# Patient Record
Sex: Male | Born: 1950
Health system: Southern US, Community
[De-identification: ages and names within clinical notes are randomized; demographics above are authoritative.]

## PROBLEM LIST (undated history)

## (undated) DIAGNOSIS — M19249 Secondary osteoarthritis, unspecified hand: Secondary | ICD-10-CM

## (undated) DIAGNOSIS — I5022 Chronic systolic (congestive) heart failure: Secondary | ICD-10-CM

## (undated) DIAGNOSIS — I2510 Atherosclerotic heart disease of native coronary artery without angina pectoris: Secondary | ICD-10-CM

## (undated) DIAGNOSIS — I10 Essential (primary) hypertension: Secondary | ICD-10-CM

## (undated) DIAGNOSIS — M7750 Other enthesopathy of unspecified foot: Secondary | ICD-10-CM

## (undated) DIAGNOSIS — M5382 Other specified dorsopathies, cervical region: Secondary | ICD-10-CM

## (undated) DIAGNOSIS — I480 Paroxysmal atrial fibrillation: Secondary | ICD-10-CM

## (undated) DIAGNOSIS — I499 Cardiac arrhythmia, unspecified: Secondary | ICD-10-CM

## (undated) DIAGNOSIS — IMO0002 Reserved for concepts with insufficient information to code with codable children: Secondary | ICD-10-CM

## (undated) HISTORY — DX: Secondary osteoarthritis, unspecified hand: M19.249

## (undated) HISTORY — PX: APPENDECTOMY: SHX54

## (undated) HISTORY — DX: Other enthesopathy of unspecified foot and ankle: M77.50

## (undated) HISTORY — DX: Chronic systolic (congestive) heart failure: I50.22

## (undated) HISTORY — DX: Reserved for concepts with insufficient information to code with codable children: IMO0002

## (undated) HISTORY — DX: Paroxysmal atrial fibrillation: I48.0

## (undated) HISTORY — PX: CHOLECYSTECTOMY: SHX55

## (undated) HISTORY — DX: Other specified dorsopathies, cervical region: M53.82

---
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
## 1998-03-31 ENCOUNTER — Encounter: Admission: RE | Admit: 1998-03-31 | Discharge: 1998-03-31 | Payer: Self-pay | Admitting: Sports Medicine

## 1998-04-21 ENCOUNTER — Encounter: Admission: RE | Admit: 1998-04-21 | Discharge: 1998-04-21 | Payer: Self-pay | Admitting: Family Medicine

## 1998-04-24 ENCOUNTER — Encounter: Admission: RE | Admit: 1998-04-24 | Discharge: 1998-07-23 | Payer: Self-pay | Admitting: Sports Medicine

## 1998-05-04 ENCOUNTER — Encounter: Admission: RE | Admit: 1998-05-04 | Discharge: 1998-05-04 | Payer: Self-pay | Admitting: Family Medicine

## 1998-06-05 ENCOUNTER — Encounter: Admission: RE | Admit: 1998-06-05 | Discharge: 1998-06-05 | Payer: Self-pay | Admitting: Family Medicine

## 1998-07-07 ENCOUNTER — Encounter: Admission: RE | Admit: 1998-07-07 | Discharge: 1998-07-07 | Payer: Self-pay | Admitting: Family Medicine

## 1998-07-14 ENCOUNTER — Ambulatory Visit (HOSPITAL_COMMUNITY): Admission: RE | Admit: 1998-07-14 | Discharge: 1998-07-14 | Payer: Self-pay | Admitting: Sports Medicine

## 1998-07-14 ENCOUNTER — Encounter: Payer: Self-pay | Admitting: Sports Medicine

## 1998-11-11 HISTORY — PX: MULTIPLE TOOTH EXTRACTIONS: SHX2053

## 1999-04-04 ENCOUNTER — Ambulatory Visit (HOSPITAL_COMMUNITY): Admission: RE | Admit: 1999-04-04 | Discharge: 1999-04-04 | Payer: Self-pay | Admitting: *Deleted

## 1999-04-04 ENCOUNTER — Inpatient Hospital Stay (HOSPITAL_COMMUNITY): Admission: EM | Admit: 1999-04-04 | Discharge: 1999-04-06 | Payer: Self-pay | Admitting: Surgery

## 1999-04-04 ENCOUNTER — Encounter: Admission: RE | Admit: 1999-04-04 | Discharge: 1999-04-04 | Payer: Self-pay | Admitting: Family Medicine

## 2000-02-07 ENCOUNTER — Encounter: Admission: RE | Admit: 2000-02-07 | Discharge: 2000-02-07 | Payer: Self-pay | Admitting: Sports Medicine

## 2000-02-15 ENCOUNTER — Encounter: Admission: RE | Admit: 2000-02-15 | Discharge: 2000-02-15 | Payer: Self-pay | Admitting: Family Medicine

## 2000-03-10 ENCOUNTER — Encounter: Admission: RE | Admit: 2000-03-10 | Discharge: 2000-03-10 | Payer: Self-pay | Admitting: Sports Medicine

## 2000-04-18 ENCOUNTER — Ambulatory Visit (HOSPITAL_COMMUNITY): Admission: RE | Admit: 2000-04-18 | Discharge: 2000-04-18 | Payer: Self-pay | Admitting: Sports Medicine

## 2001-09-22 ENCOUNTER — Encounter: Admission: RE | Admit: 2001-09-22 | Discharge: 2001-09-22 | Payer: Self-pay | Admitting: Sports Medicine

## 2001-12-03 ENCOUNTER — Encounter: Admission: RE | Admit: 2001-12-03 | Discharge: 2001-12-03 | Payer: Self-pay | Admitting: Sports Medicine

## 2002-08-02 ENCOUNTER — Encounter: Admission: RE | Admit: 2002-08-02 | Discharge: 2002-08-02 | Payer: Self-pay | Admitting: Family Medicine

## 2002-08-02 ENCOUNTER — Ambulatory Visit (HOSPITAL_COMMUNITY): Admission: RE | Admit: 2002-08-02 | Discharge: 2002-08-02 | Payer: Self-pay | Admitting: Sports Medicine

## 2002-11-10 ENCOUNTER — Encounter: Admission: RE | Admit: 2002-11-10 | Discharge: 2002-11-10 | Payer: Self-pay | Admitting: Sports Medicine

## 2002-11-10 ENCOUNTER — Encounter: Payer: Self-pay | Admitting: Sports Medicine

## 2002-11-15 ENCOUNTER — Encounter: Admission: RE | Admit: 2002-11-15 | Discharge: 2002-11-15 | Payer: Self-pay | Admitting: Sports Medicine

## 2002-11-15 ENCOUNTER — Encounter: Payer: Self-pay | Admitting: Sports Medicine

## 2002-11-19 ENCOUNTER — Encounter: Admission: RE | Admit: 2002-11-19 | Discharge: 2002-11-19 | Payer: Self-pay | Admitting: Sports Medicine

## 2002-11-26 ENCOUNTER — Encounter: Admission: RE | Admit: 2002-11-26 | Discharge: 2002-11-26 | Payer: Self-pay | Admitting: Family Medicine

## 2002-11-26 ENCOUNTER — Ambulatory Visit (HOSPITAL_COMMUNITY): Admission: RE | Admit: 2002-11-26 | Discharge: 2002-11-26 | Payer: Self-pay | Admitting: Sports Medicine

## 2002-12-09 ENCOUNTER — Encounter: Admission: RE | Admit: 2002-12-09 | Discharge: 2002-12-09 | Payer: Self-pay | Admitting: Sports Medicine

## 2002-12-30 ENCOUNTER — Encounter: Admission: RE | Admit: 2002-12-30 | Discharge: 2002-12-30 | Payer: Self-pay | Admitting: Sports Medicine

## 2003-01-27 ENCOUNTER — Encounter: Admission: RE | Admit: 2003-01-27 | Discharge: 2003-01-27 | Payer: Self-pay | Admitting: Sports Medicine

## 2004-01-05 ENCOUNTER — Encounter: Admission: RE | Admit: 2004-01-05 | Discharge: 2004-01-05 | Payer: Self-pay | Admitting: Family Medicine

## 2004-01-19 ENCOUNTER — Encounter: Admission: RE | Admit: 2004-01-19 | Discharge: 2004-01-19 | Payer: Self-pay | Admitting: Sports Medicine

## 2004-02-27 ENCOUNTER — Ambulatory Visit (HOSPITAL_COMMUNITY): Admission: RE | Admit: 2004-02-27 | Discharge: 2004-02-27 | Payer: Self-pay | Admitting: Sports Medicine

## 2004-07-24 ENCOUNTER — Ambulatory Visit: Payer: Self-pay

## 2004-07-31 ENCOUNTER — Ambulatory Visit: Payer: Self-pay | Admitting: Sports Medicine

## 2005-01-25 ENCOUNTER — Ambulatory Visit (HOSPITAL_COMMUNITY): Admission: RE | Admit: 2005-01-25 | Discharge: 2005-01-25 | Payer: Self-pay | Admitting: Gastroenterology

## 2005-03-01 ENCOUNTER — Ambulatory Visit (HOSPITAL_COMMUNITY): Admission: RE | Admit: 2005-03-01 | Discharge: 2005-03-01 | Payer: Self-pay | Admitting: Sports Medicine

## 2005-05-15 ENCOUNTER — Ambulatory Visit: Payer: Self-pay | Admitting: Sports Medicine

## 2005-12-26 ENCOUNTER — Ambulatory Visit: Payer: Self-pay | Admitting: Family Medicine

## 2006-07-24 ENCOUNTER — Ambulatory Visit: Payer: Self-pay | Admitting: Sports Medicine

## 2006-07-24 ENCOUNTER — Ambulatory Visit (HOSPITAL_COMMUNITY): Admission: RE | Admit: 2006-07-24 | Discharge: 2006-07-24 | Payer: Self-pay | Admitting: Sports Medicine

## 2007-01-08 DIAGNOSIS — K5732 Diverticulitis of large intestine without perforation or abscess without bleeding: Secondary | ICD-10-CM

## 2007-01-08 DIAGNOSIS — M5382 Other specified dorsopathies, cervical region: Secondary | ICD-10-CM | POA: Insufficient documentation

## 2007-01-08 DIAGNOSIS — C4490 Unspecified malignant neoplasm of skin, unspecified: Secondary | ICD-10-CM

## 2007-01-08 DIAGNOSIS — IMO0002 Reserved for concepts with insufficient information to code with codable children: Secondary | ICD-10-CM

## 2007-01-08 DIAGNOSIS — G250 Essential tremor: Secondary | ICD-10-CM

## 2007-01-08 DIAGNOSIS — G252 Other specified forms of tremor: Secondary | ICD-10-CM

## 2007-01-08 DIAGNOSIS — I4891 Unspecified atrial fibrillation: Secondary | ICD-10-CM

## 2007-06-30 ENCOUNTER — Ambulatory Visit: Payer: Self-pay | Admitting: Sports Medicine

## 2007-06-30 DIAGNOSIS — L259 Unspecified contact dermatitis, unspecified cause: Secondary | ICD-10-CM

## 2007-06-30 DIAGNOSIS — M19249 Secondary osteoarthritis, unspecified hand: Secondary | ICD-10-CM

## 2007-09-22 ENCOUNTER — Ambulatory Visit: Payer: Self-pay | Admitting: Sports Medicine

## 2007-09-22 DIAGNOSIS — M7750 Other enthesopathy of unspecified foot: Secondary | ICD-10-CM | POA: Insufficient documentation

## 2007-09-22 DIAGNOSIS — B070 Plantar wart: Secondary | ICD-10-CM

## 2007-09-29 ENCOUNTER — Ambulatory Visit: Payer: Self-pay | Admitting: Sports Medicine

## 2007-11-18 ENCOUNTER — Encounter: Payer: Self-pay | Admitting: *Deleted

## 2007-12-10 ENCOUNTER — Telehealth: Payer: Self-pay | Admitting: *Deleted

## 2007-12-10 ENCOUNTER — Encounter: Payer: Self-pay | Admitting: Sports Medicine

## 2007-12-10 ENCOUNTER — Telehealth: Payer: Self-pay | Admitting: Sports Medicine

## 2008-01-12 ENCOUNTER — Ambulatory Visit: Payer: Self-pay | Admitting: Sports Medicine

## 2008-04-08 ENCOUNTER — Encounter (INDEPENDENT_AMBULATORY_CARE_PROVIDER_SITE_OTHER): Payer: Self-pay | Admitting: *Deleted

## 2008-04-08 ENCOUNTER — Ambulatory Visit: Payer: Self-pay | Admitting: Family Medicine

## 2008-04-08 DIAGNOSIS — R030 Elevated blood-pressure reading, without diagnosis of hypertension: Secondary | ICD-10-CM | POA: Insufficient documentation

## 2008-04-19 ENCOUNTER — Telehealth (INDEPENDENT_AMBULATORY_CARE_PROVIDER_SITE_OTHER): Payer: Self-pay | Admitting: *Deleted

## 2008-04-19 ENCOUNTER — Ambulatory Visit: Payer: Self-pay | Admitting: Sports Medicine

## 2008-11-11 DIAGNOSIS — I219 Acute myocardial infarction, unspecified: Secondary | ICD-10-CM

## 2008-11-11 HISTORY — DX: Acute myocardial infarction, unspecified: I21.9

## 2009-08-23 ENCOUNTER — Ambulatory Visit: Payer: Self-pay | Admitting: Sports Medicine

## 2009-08-23 DIAGNOSIS — M19049 Primary osteoarthritis, unspecified hand: Secondary | ICD-10-CM | POA: Insufficient documentation

## 2009-10-11 HISTORY — PX: CORONARY ARTERY BYPASS GRAFT: SHX141

## 2009-10-21 ENCOUNTER — Inpatient Hospital Stay (HOSPITAL_COMMUNITY): Admission: EM | Admit: 2009-10-21 | Discharge: 2009-10-28 | Payer: Self-pay | Admitting: Gastroenterology

## 2009-10-21 ENCOUNTER — Ambulatory Visit: Payer: Self-pay | Admitting: Thoracic Surgery (Cardiothoracic Vascular Surgery)

## 2009-10-26 ENCOUNTER — Encounter: Payer: Self-pay | Admitting: Thoracic Surgery (Cardiothoracic Vascular Surgery)

## 2009-11-02 ENCOUNTER — Inpatient Hospital Stay (HOSPITAL_COMMUNITY): Admission: AD | Admit: 2009-11-02 | Discharge: 2009-11-06 | Payer: Self-pay | Admitting: Interventional Cardiology

## 2009-11-13 ENCOUNTER — Ambulatory Visit: Payer: Self-pay | Admitting: Thoracic Surgery (Cardiothoracic Vascular Surgery)

## 2009-11-13 ENCOUNTER — Encounter
Admission: RE | Admit: 2009-11-13 | Discharge: 2009-11-13 | Payer: Self-pay | Admitting: Thoracic Surgery (Cardiothoracic Vascular Surgery)

## 2009-11-23 ENCOUNTER — Encounter (HOSPITAL_COMMUNITY): Admission: RE | Admit: 2009-11-23 | Discharge: 2010-02-21 | Payer: Self-pay | Admitting: Interventional Cardiology

## 2010-02-22 ENCOUNTER — Encounter (HOSPITAL_COMMUNITY): Admission: RE | Admit: 2010-02-22 | Discharge: 2010-03-12 | Payer: Self-pay | Admitting: Interventional Cardiology

## 2010-02-26 ENCOUNTER — Ambulatory Visit (HOSPITAL_COMMUNITY): Admission: RE | Admit: 2010-02-26 | Discharge: 2010-02-26 | Payer: Self-pay | Admitting: Interventional Cardiology

## 2010-02-26 ENCOUNTER — Ambulatory Visit: Payer: Self-pay | Admitting: Cardiology

## 2010-05-21 ENCOUNTER — Encounter
Admission: RE | Admit: 2010-05-21 | Discharge: 2010-05-21 | Payer: Self-pay | Admitting: Thoracic Surgery (Cardiothoracic Vascular Surgery)

## 2010-05-21 ENCOUNTER — Ambulatory Visit: Payer: Self-pay | Admitting: Thoracic Surgery (Cardiothoracic Vascular Surgery)

## 2010-06-02 IMAGING — CR DG CHEST 2V
2 series · 2 of 2 positions shown · non-contrast
Comparison: None

CLINICAL DATA: Short of breath, history of atrial fibrillation

CHEST - 2 VIEW

[w chest pa]
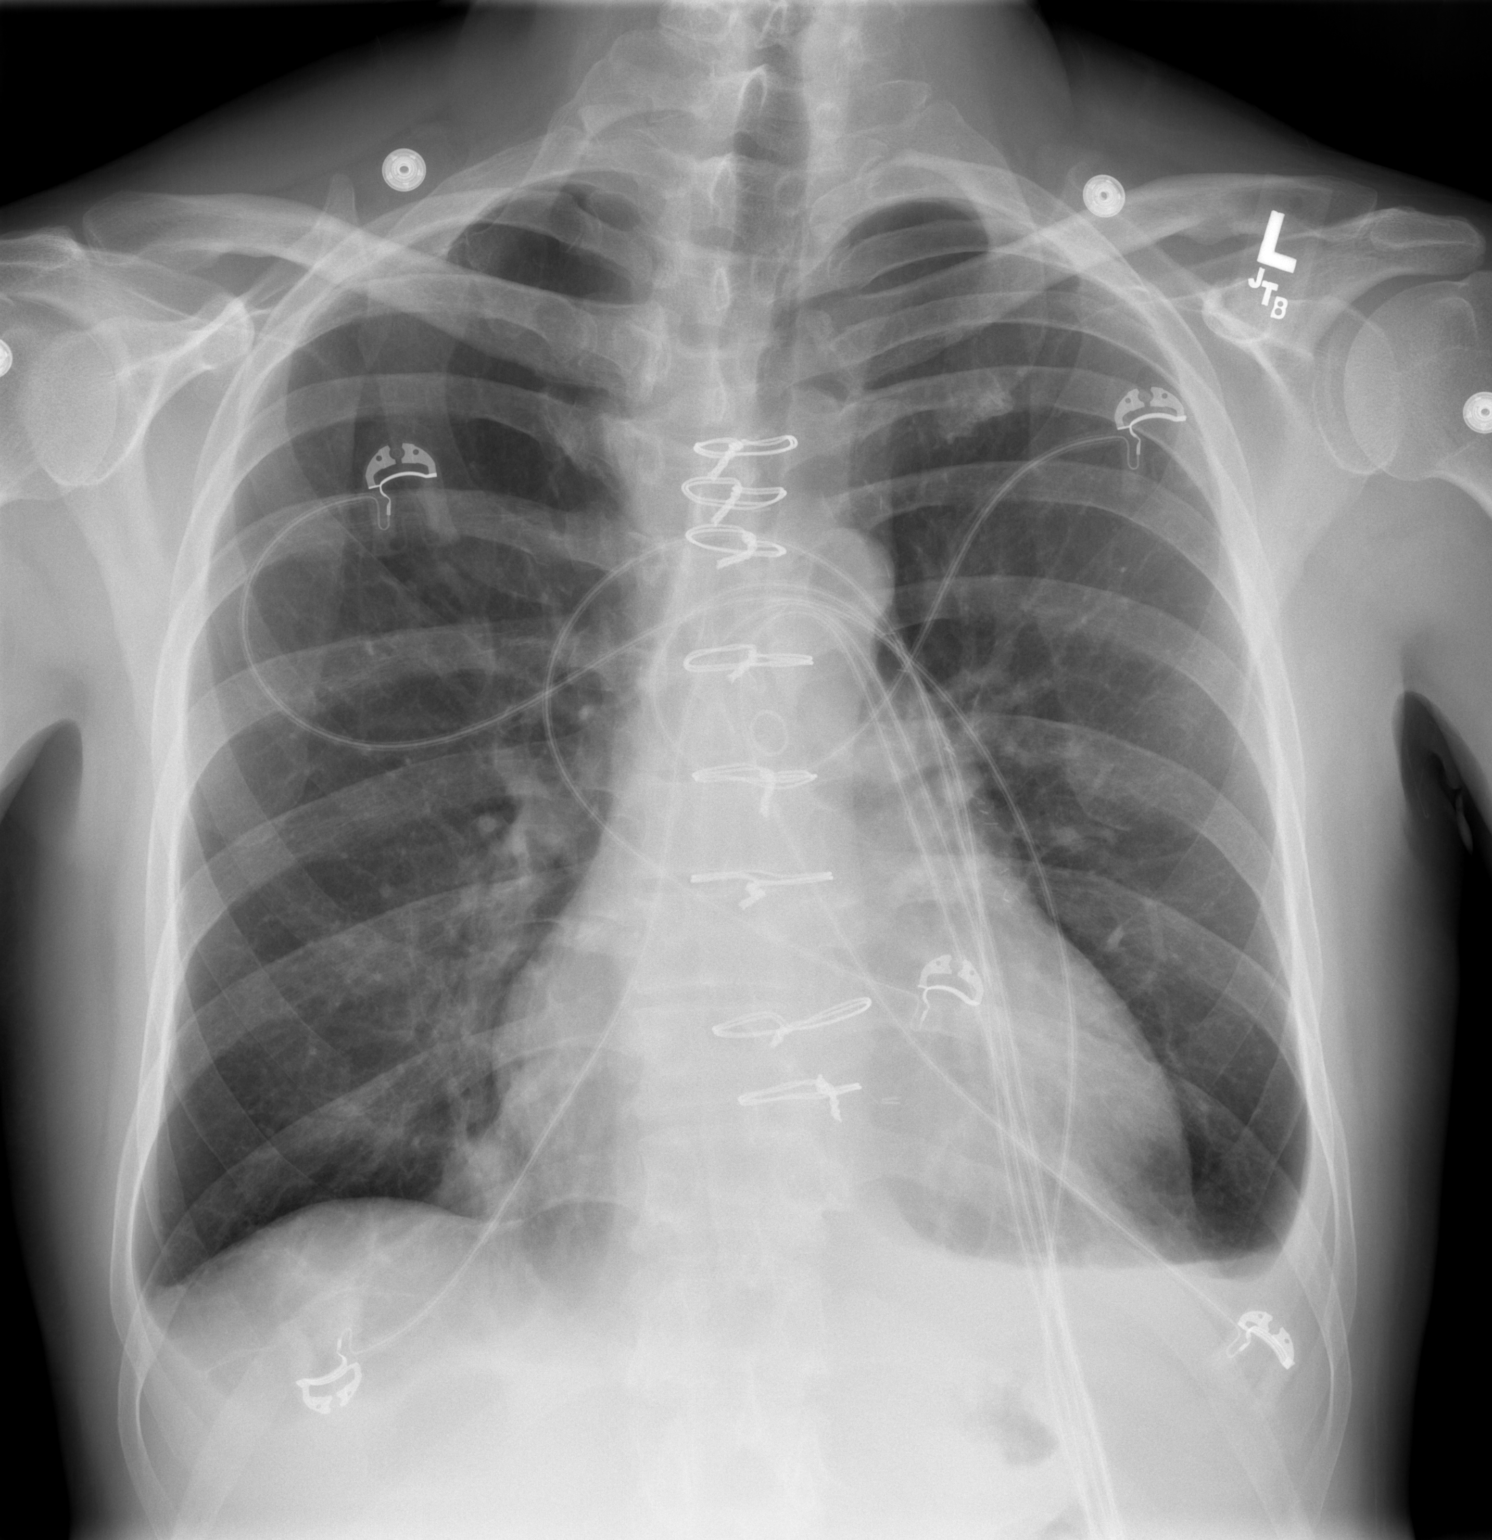

[w chest lat]
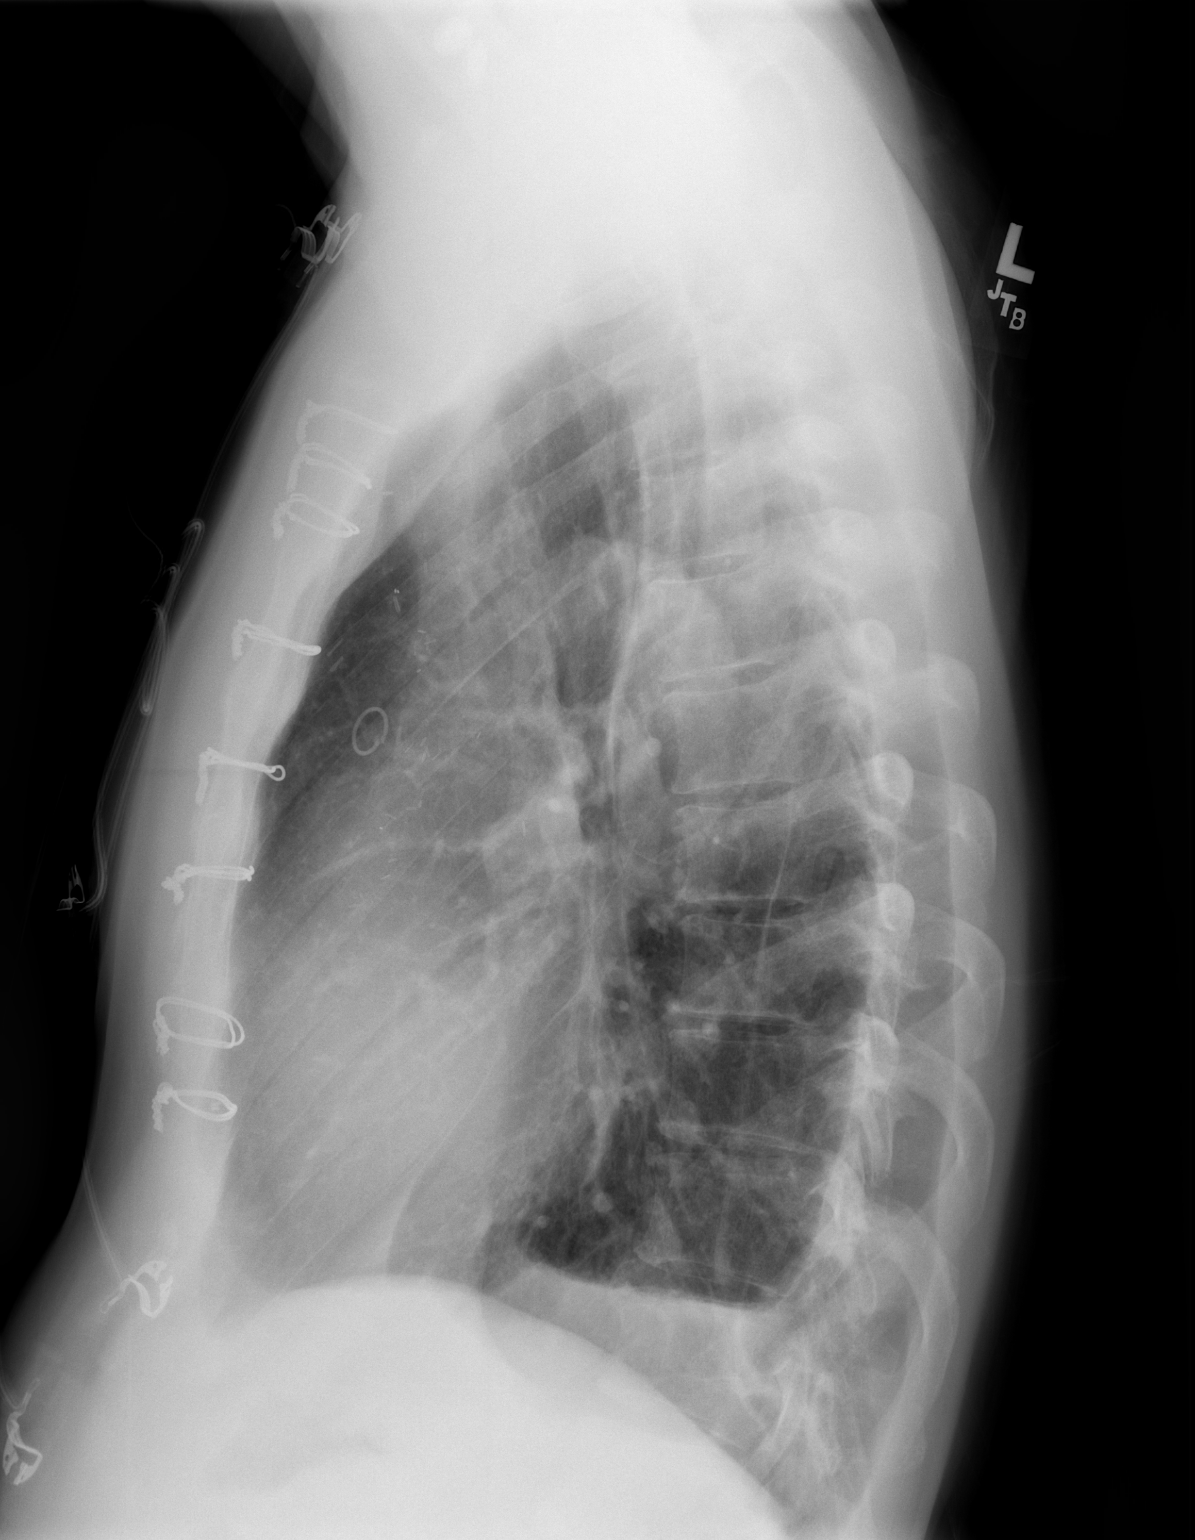

[2 of 2 positions shown; findings below may reference images not displayed]

FINDINGS: There is parenchymal opacity at the right lung base.
This could be due to prior pneumonia with scarring, but active
pneumonia or lung nodules cannot be excluded.  There is also
blunting of the left costophrenic angle which may be due to
effusion and/or pleural thickening.  Comparison with prior or
follow-up chest x-ray is recommended.  There is mild cardiomegaly
present.  Median sternotomy sutures are noted from prior CABG.
IMPRESSION: 1.  Nodular opacities at the right lung base may be due to
pneumonia, scarring from prior infection, or lung nodules and
comparison with prior or follow-up chest x-ray is recommended.
2.  Blunted left costophrenic angle consistent with effusion and
possible pleural thickening.
3.  Cardiomegaly.

## 2010-12-02 ENCOUNTER — Encounter: Payer: Self-pay | Admitting: Thoracic Surgery (Cardiothoracic Vascular Surgery)

## 2011-02-11 LAB — BASIC METABOLIC PANEL
BUN: 13 mg/dL (ref 6–23)
BUN: 12 mg/dL (ref 6–23)
BUN: 13 mg/dL (ref 6–23)
BUN: 14 mg/dL (ref 6–23)
BUN: 15 mg/dL (ref 6–23)
CO2: 25 mEq/L (ref 19–32)
CO2: 26 mEq/L (ref 19–32)
CO2: 27 mEq/L (ref 19–32)
Calcium: 7.8 mg/dL — ABNORMAL LOW (ref 8.4–10.5)
Calcium: 8.5 mg/dL (ref 8.4–10.5)
Calcium: 8.7 mg/dL (ref 8.4–10.5)
Chloride: 102 mEq/L (ref 96–112)
Chloride: 99 mEq/L (ref 96–112)
Chloride: 103 mEq/L (ref 96–112)
Chloride: 103 mEq/L (ref 96–112)
Chloride: 104 mEq/L (ref 96–112)
Chloride: 106 mEq/L (ref 96–112)
Creatinine, Ser: 1.08 mg/dL (ref 0.4–1.5)
Creatinine, Ser: 1.1 mg/dL (ref 0.4–1.5)
Creatinine, Ser: 1.17 mg/dL (ref 0.4–1.5)
Creatinine, Ser: 1.19 mg/dL (ref 0.4–1.5)
GFR calc Af Amer: >60 mL/min (ref >60)
GFR calc Af Amer: >60 mL/min (ref >60)
GFR calc Af Amer: >60 mL/min (ref >60)
GFR calc Af Amer: >60 mL/min (ref >60)
GFR calc non Af Amer: >60 mL/min (ref >60)
Glucose, Bld: 108 mg/dL — ABNORMAL HIGH (ref 70–99)
Glucose, Bld: 101 mg/dL — ABNORMAL HIGH (ref 70–99)
Glucose, Bld: 105 mg/dL — ABNORMAL HIGH (ref 70–99)
Glucose, Bld: 99 mg/dL (ref 70–99)
Potassium: 3.7 mEq/L (ref 3.5–5.1)
Potassium: 3.5 mEq/L (ref 3.5–5.1)
Potassium: 3.9 mEq/L (ref 3.5–5.1)
Sodium: 135 mEq/L (ref 135–145)

## 2011-02-11 LAB — CBC
MCHC: 34.3 g/dL (ref 30.0–36.0)
MCHC: 34.3 g/dL (ref 30.0–36.0)
MCV: 92.2 fL (ref 78.0–100.0)
MCV: 92.6 fL (ref 78.0–100.0)
Platelets: 408 10*3/uL — ABNORMAL HIGH (ref 150–400)
Platelets: 450 10*3/uL — ABNORMAL HIGH (ref 150–400)
RBC: 3.35 MIL/uL — ABNORMAL LOW (ref 4.22–5.81)
RDW: 13.1 % (ref 11.5–15.5)
RDW: 13.2 % (ref 11.5–15.5)

## 2011-02-11 LAB — PROTIME-INR
INR: 1.37 (ref 0.00–1.49)
INR: 1.74 — ABNORMAL HIGH (ref 0.00–1.49)
INR: 2.1 — ABNORMAL HIGH (ref 0.00–1.49)
INR: 2.31 — ABNORMAL HIGH (ref 0.00–1.49)
INR: 2.49 — ABNORMAL HIGH (ref 0.00–1.49)
INR: 2.89 — ABNORMAL HIGH (ref 0.00–1.49)
Prothrombin Time: 16.8 seconds — ABNORMAL HIGH (ref 11.6–15.2)
Prothrombin Time: 19 seconds — ABNORMAL HIGH (ref 11.6–15.2)
Prothrombin Time: 25.2 seconds — ABNORMAL HIGH (ref 11.6–15.2)
Prothrombin Time: 30 seconds — ABNORMAL HIGH (ref 11.6–15.2)

## 2011-02-11 LAB — BRAIN NATRIURETIC PEPTIDE
Pro B Natriuretic peptide (BNP): 941 pg/mL — ABNORMAL HIGH (ref 0.0–100.0)
Pro B Natriuretic peptide (BNP): 973 pg/mL — ABNORMAL HIGH (ref 0.0–100.0)

## 2011-02-11 LAB — GLUCOSE, CAPILLARY
Glucose-Capillary: 102 mg/dL — ABNORMAL HIGH (ref 70–99)
Glucose-Capillary: 135 mg/dL — ABNORMAL HIGH (ref 70–99)

## 2011-02-11 LAB — APTT: aPTT: 52 seconds — ABNORMAL HIGH (ref 24–37)

## 2011-02-12 LAB — CBC
HCT: 32.9 % — ABNORMAL LOW (ref 39.0–52.0)
HCT: 37.9 % — ABNORMAL LOW (ref 39.0–52.0)
Hemoglobin: 11.9 g/dL — ABNORMAL LOW (ref 13.0–17.0)
Hemoglobin: 13.4 g/dL (ref 13.0–17.0)
Hemoglobin: 14.2 g/dL (ref 13.0–17.0)
MCHC: 34.5 g/dL (ref 30.0–36.0)
MCHC: 34.7 g/dL (ref 30.0–36.0)
MCHC: 35 g/dL (ref 30.0–36.0)
MCHC: 35.2 g/dL (ref 30.0–36.0)
MCHC: 35.4 g/dL (ref 30.0–36.0)
MCV: 91.1 fL (ref 78.0–100.0)
MCV: 91.7 fL (ref 78.0–100.0)
MCV: 92.6 fL (ref 78.0–100.0)
MCV: 92.7 fL (ref 78.0–100.0)
MCV: 92.7 fL (ref 78.0–100.0)
Platelets: 140 10*3/uL — ABNORMAL LOW (ref 150–400)
Platelets: 176 10*3/uL (ref 150–400)
Platelets: 193 10*3/uL (ref 150–400)
Platelets: 234 10*3/uL (ref 150–400)
RBC: 3.55 MIL/uL — ABNORMAL LOW (ref 4.22–5.81)
RBC: 3.71 MIL/uL — ABNORMAL LOW (ref 4.22–5.81)
RBC: 4.16 MIL/uL — ABNORMAL LOW (ref 4.22–5.81)
RBC: 4.51 MIL/uL (ref 4.22–5.81)
RDW: 12.4 % (ref 11.5–15.5)
RDW: 12.7 % (ref 11.5–15.5)
RDW: 12.8 % (ref 11.5–15.5)
WBC: 11 10*3/uL — ABNORMAL HIGH (ref 4.0–10.5)
WBC: 17.2 10*3/uL — ABNORMAL HIGH (ref 4.0–10.5)
WBC: 9.6 10*3/uL (ref 4.0–10.5)

## 2011-02-12 LAB — CK TOTAL AND CKMB (NOT AT ARMC): Total CK: 3042 U/L — ABNORMAL HIGH (ref 7–232)

## 2011-02-12 LAB — POCT I-STAT 3, ART BLOOD GAS (G3+)
Acid-base deficit: 1 mmol/L (ref 0.0–2.0)
Acid-base deficit: 1 mmol/L (ref 0.0–2.0)
Acid-base deficit: 5 mmol/L — ABNORMAL HIGH (ref 0.0–2.0)
Acid-base deficit: 7 mmol/L — ABNORMAL HIGH (ref 0.0–2.0)
Acid-base deficit: 8 mmol/L — ABNORMAL HIGH (ref 0.0–2.0)
Acid-base deficit: 9 mmol/L — ABNORMAL HIGH (ref 0.0–2.0)
Bicarbonate: 19.8 mEq/L — ABNORMAL LOW (ref 20.0–24.0)
Bicarbonate: 20.5 mEq/L (ref 20.0–24.0)
Bicarbonate: 26.5 mEq/L — ABNORMAL HIGH (ref 20.0–24.0)
O2 Saturation: 65 %
O2 Saturation: 90 %
O2 Saturation: 90 %
Patient temperature: 35.8
TCO2: 21 mmol/L (ref 0–100)
TCO2: 22 mmol/L (ref 0–100)
pCO2 arterial: 32.9 mmHg — ABNORMAL LOW (ref 35.0–45.0)
pCO2 arterial: 53.1 mmHg — ABNORMAL HIGH (ref 35.0–45.0)
pCO2 arterial: 55.4 mmHg — ABNORMAL HIGH (ref 35.0–45.0)
pCO2 arterial: 69.4 mmHg (ref 35.0–45.0)
pH, Arterial: 7.132 — CL (ref 7.350–7.450)
pH, Arterial: 7.199 — CL (ref 7.350–7.450)
pO2, Arterial: 33 mmHg — CL (ref 80.0–100.0)
pO2, Arterial: 54 mmHg — ABNORMAL LOW (ref 80.0–100.0)
pO2, Arterial: 54 mmHg — ABNORMAL LOW (ref 80.0–100.0)
pO2, Arterial: 77 mmHg — ABNORMAL LOW (ref 80.0–100.0)

## 2011-02-12 LAB — GLUCOSE, CAPILLARY
Glucose-Capillary: 108 mg/dL — ABNORMAL HIGH (ref 70–99)
Glucose-Capillary: 108 mg/dL — ABNORMAL HIGH (ref 70–99)
Glucose-Capillary: 131 mg/dL — ABNORMAL HIGH (ref 70–99)
Glucose-Capillary: 153 mg/dL — ABNORMAL HIGH (ref 70–99)

## 2011-02-12 LAB — PROTIME-INR
INR: 1.08 (ref 0.00–1.49)
INR: 1.27 (ref 0.00–1.49)
INR: 1.34 (ref 0.00–1.49)
INR: 1.7 — ABNORMAL HIGH (ref 0.00–1.49)
Prothrombin Time: 13.9 seconds (ref 11.6–15.2)
Prothrombin Time: 16.5 seconds — ABNORMAL HIGH (ref 11.6–15.2)
Prothrombin Time: 19.8 seconds — ABNORMAL HIGH (ref 11.6–15.2)

## 2011-02-12 LAB — POCT I-STAT, CHEM 8
BUN: 15 mg/dL (ref 6–23)
Calcium, Ion: 1.03 mmol/L — ABNORMAL LOW (ref 1.12–1.32)
Calcium, Ion: 1.33 mmol/L — ABNORMAL HIGH (ref 1.12–1.32)
Chloride: 108 mEq/L (ref 96–112)
Chloride: 110 mEq/L (ref 96–112)
Creatinine, Ser: 1.3 mg/dL (ref 0.4–1.5)
Glucose, Bld: 150 mg/dL — ABNORMAL HIGH (ref 70–99)
HCT: 42 % (ref 39.0–52.0)
Hemoglobin: 14.3 g/dL (ref 13.0–17.0)
Sodium: 142 mEq/L (ref 135–145)
TCO2: 20 mmol/L (ref 0–100)
TCO2: 22 mmol/L (ref 0–100)

## 2011-02-12 LAB — BASIC METABOLIC PANEL
BUN: 16 mg/dL (ref 6–23)
BUN: 26 mg/dL — ABNORMAL HIGH (ref 6–23)
BUN: 27 mg/dL — ABNORMAL HIGH (ref 6–23)
CO2: 22 mEq/L (ref 19–32)
CO2: 25 mEq/L (ref 19–32)
CO2: 26 mEq/L (ref 19–32)
CO2: 27 mEq/L (ref 19–32)
Calcium: 8.2 mg/dL — ABNORMAL LOW (ref 8.4–10.5)
Calcium: 8.5 mg/dL (ref 8.4–10.5)
Calcium: 8.7 mg/dL (ref 8.4–10.5)
Calcium: 9.2 mg/dL (ref 8.4–10.5)
Chloride: 103 mEq/L (ref 96–112)
Chloride: 105 mEq/L (ref 96–112)
Chloride: 108 mEq/L (ref 96–112)
Chloride: 98 mEq/L (ref 96–112)
Creatinine, Ser: 1.08 mg/dL (ref 0.4–1.5)
Creatinine, Ser: 1.15 mg/dL (ref 0.4–1.5)
Creatinine, Ser: 1.19 mg/dL (ref 0.4–1.5)
Creatinine, Ser: 1.34 mg/dL (ref 0.4–1.5)
GFR calc Af Amer: >60 mL/min (ref >60)
GFR calc Af Amer: >60 mL/min (ref >60)
GFR calc Af Amer: >60 mL/min (ref >60)
GFR calc non Af Amer: >60 mL/min (ref >60)
GFR calc non Af Amer: >60 mL/min (ref >60)
Glucose, Bld: 119 mg/dL — ABNORMAL HIGH (ref 70–99)
Glucose, Bld: 152 mg/dL — ABNORMAL HIGH (ref 70–99)
Glucose, Bld: 157 mg/dL — ABNORMAL HIGH (ref 70–99)
Potassium: 3.9 mEq/L (ref 3.5–5.1)
Potassium: 3.9 mEq/L (ref 3.5–5.1)
Potassium: 4.2 mEq/L (ref 3.5–5.1)
Sodium: 135 mEq/L (ref 135–145)
Sodium: 135 mEq/L (ref 135–145)

## 2011-02-12 LAB — POCT I-STAT 4, (NA,K, GLUC, HGB,HCT)
Glucose, Bld: 123 mg/dL — ABNORMAL HIGH (ref 70–99)
Glucose, Bld: 134 mg/dL — ABNORMAL HIGH (ref 70–99)
Glucose, Bld: 150 mg/dL — ABNORMAL HIGH (ref 70–99)
Glucose, Bld: 152 mg/dL — ABNORMAL HIGH (ref 70–99)
HCT: 41 % (ref 39.0–52.0)
HCT: 47 % (ref 39.0–52.0)
HCT: 48 % (ref 39.0–52.0)
HCT: 49 % (ref 39.0–52.0)
Hemoglobin: 13.9 g/dL (ref 13.0–17.0)
Hemoglobin: 14.3 g/dL (ref 13.0–17.0)
Hemoglobin: 16 g/dL (ref 13.0–17.0)
Hemoglobin: 16.3 g/dL (ref 13.0–17.0)
Hemoglobin: 16.7 g/dL (ref 13.0–17.0)
Potassium: 3.7 mEq/L (ref 3.5–5.1)
Potassium: 3.8 mEq/L (ref 3.5–5.1)
Potassium: 3.9 mEq/L (ref 3.5–5.1)
Potassium: 4 mEq/L (ref 3.5–5.1)
Sodium: 142 mEq/L (ref 135–145)
Sodium: 142 mEq/L (ref 135–145)
Sodium: 142 mEq/L (ref 135–145)

## 2011-02-12 LAB — CARDIAC PANEL(CRET KIN+CKTOT+MB+TROPI)
CK, MB: 118.5 ng/mL — ABNORMAL HIGH (ref 0.3–4.0)
Relative Index: 9.2 — ABNORMAL HIGH (ref 0.0–2.5)
Troponin I: 61.71 ng/mL (ref 0.00–0.06)

## 2011-02-12 LAB — CREATININE, SERUM: GFR calc Af Amer: >60 mL/min (ref >60)

## 2011-02-12 LAB — DIFFERENTIAL
Basophils Relative: 1 % (ref 0–1)
Eosinophils Absolute: 0.8 10*3/uL — ABNORMAL HIGH (ref 0.0–0.7)
Lymphs Abs: 3.5 10*3/uL (ref 0.7–4.0)
Neutro Abs: 4.4 10*3/uL (ref 1.7–7.7)
Neutrophils Relative %: 46 % (ref 43–77)

## 2011-02-12 LAB — MRSA PCR SCREENING: MRSA by PCR: NEGATIVE

## 2011-02-12 LAB — CROSSMATCH: Antibody Screen: NEGATIVE

## 2011-02-12 LAB — APTT
aPTT: 28 seconds (ref 24–37)
aPTT: 40 seconds — ABNORMAL HIGH (ref 24–37)

## 2011-02-12 LAB — MAGNESIUM: Magnesium: 2 mg/dL (ref 1.5–2.5)

## 2011-02-12 LAB — HEMOGLOBIN A1C
Hgb A1c MFr Bld: 5.4 % (ref 4.6–6.1)
Mean Plasma Glucose: 108 mg/dL

## 2011-02-12 LAB — LIPID PANEL
Cholesterol: 97 mg/dL (ref 0–200)
HDL: 34 mg/dL — ABNORMAL LOW (ref >39)

## 2011-03-26 NOTE — Assessment & Plan Note (Signed)
OFFICE VISIT   Isaac [name], Isaac [name]  DOB:  04-Jun-1951                                        May 21, 2010  CHART #:  [id number]   HISTORY OF PRESENT ILLNESS:  The patient returns for follow up status  post emergency median sternotomy for off-pump coronary artery bypass  grafting x2 on October 21, 2009.  He was last seen here in the office  on November 13, 2009.  Since then, he has done extremely well physically.  He returns to the office today due to concerns regarding a small bump he  noticed in the middle of his median sternotomy scar.  He has not had any  fevers or chills, and he has not had any pain at all.  However, he  noticed this pump and he is curious as to whether or not it was anything  to be concerned about.  He otherwise feels quite well and is getting  along without any problems.  The remainder of his review of systems is  unrevealing and the remainder of his past medical history is unchanged.   PHYSICAL EXAMINATION:  Notable for a well-appearing thin white male with  blood pressure 95/62, pulse 60 and regular, oxygen saturation 96% on  room air.  Examination of the chest reveals well-healed median  sternotomy scar.  The sternum is stable on palpation.  The pump that the  patient is concerned about represents an easily palpable end of one of  the sternal wires noted inferiorly.  Because the patient is so thin, the  end of the sternal wire is easily palpable in the subcutaneous tissues.  There is no surrounding erythema.  The skin is completely intact over  the top.  There is no tenderness in this area.  The sternum is  completely stable.  Auscultation of the chest reveals clear breath  sounds.  The remainder of his physical exam is noncontributory.   IMPRESSION:  The patient is doing very well.  The bump he is concerned  about represents the end of one of the sternal wires that is easily  appreciated in the subcutaneous tissues because of the fact  that he is  so thin.  This is not causing any trouble at this time.   PLAN:  I have reassured the patient that this is nothing to be concerned  about.  If he develops any irritation, surrounding erythema, or drainage  from this area, we can always consider  removing the wire if necessary.  At this point, this seems to be  unlikely.  The patient will call and return as needed.   Salvatore Decent. Cornelius Moras, M.D.  Electronically Signed   CHO/MEDQ  D:  05/21/2010  T:  05/22/2010  Job:  [id number]   cc:   Lyn Records, M.D.  Colleen Can. Deborah Chalk, M.D.  Sibyl Parr. Darrick Penna, M.D.

## 2011-03-26 NOTE — Assessment & Plan Note (Signed)
OFFICE VISIT   Isaac, [name]  DOB:  1951-10-20                                        November 13, 2009  CHART #:  [id number]   The patient returns to the office today for routine followup status post  emergency median sternotomy for off-pump coronary artery bypass grafting  x2 on October 21, 2009 for severe single-vessel coronary artery disease  status post acute ST-segment elevation myocardial infarction complicated  by cardiogenic shock and acute pulmonary edema with coronary anatomy  unfavorable for definitive percutaneous therapy.  The patient's  postoperative recovery has been remarkably uneventful.  Following  hospital discharge, he was readmitted for atrial fibrillation.  He was  already been treated with amiodarone and Coumadin, and his amiodarone  dosing was increased.  He was discharged home again and has since then  continued to improve.  He continues to be followed closely by Dr. Katrinka Blazing  who is monitoring his Coumadin therapy as well as other medications.  The patient reports that he is doing well.  He states that he still  feels as though he has some occasional brief episodes of atrial  fibrillation perhaps once or twice daily.  These usually are transient,  short lived, and they are not associated with dizziness or shortness of  breath.  He simply can tell when he feels as though his heart rhythm is  out of sync.  He otherwise feels quite well and has not had problems  with shortness of breath.  He has had minimal soreness in his chest and  he is not taking any pain relievers.  His appetite is marginal, but he  feels as though he is probably getting enough to eat.  He is sleeping  well at night.  He has no other complaints.  The remainder of his review  of systems is unremarkable.   PHYSICAL EXAMINATION:  Notable for a well-appearing male with blood  pressure 106/72, pulse 76, and oxygen saturation 98% on room air.  Examination of the chest  reveals a median sternotomy incision that is  healing very nicely.  The sternum is stable on palpation.  Breath sounds  are clear to auscultation and symmetrical bilaterally.  No wheezes or  rhonchi are noted.  Cardiovascular exam demonstrates regular rate and  rhythm.  No murmurs, rubs, or gallops are appreciated.  The abdomen is  soft and nontender.  The extremities are warm and well perfused.  The  small incision from endoscopic vein harvest just below the right knee is  healing nicely.  There is a small dry eschar on this incision.  The  patient is concerned about the possibility of a retained suture in his  right neck at the location of a previous central line.  This was  explored here at the office today, but no suture material of the foreign  body is identified.  The site is clean.  No other abnormalities are  noted.   DIAGNOSTIC TESTS:  Chest x-ray performed today at the [facility] is reviewed.  This demonstrates clear lung fields bilaterally.  There are no pleural effusions and trivial atelectasis.  All the sternal  wires appear intact.  No other abnormalities are noted.   IMPRESSION:  Excellent progress following recent emergency coronary  artery bypass grafting x2.  The patient has had intermittent episodes of  paroxysmal atrial fibrillation, for which he is on amiodarone and  Coumadin.  He has a history of atrial fibrillation that predates his  heart attack and surgery as well.  Overall, he seems to be doing fine.   PLAN:  I have encouraged the patient to continue to gradually increase  his physical activity as tolerated with his only limitation at this  point remaining that he refrain from heavy lifting or strenuous use of  his arms or shoulders for least another 2 months.  I think, he can  resume driving an automobile and go ahead and get started the cardiac  rehab program, presuming that Dr. Katrinka Blazing agrees.  All of his questions  have been addressed.  We  have not made any changes in his current  medications.  We will plan to see the patient back for further followup  in 6 months to see how his LV function has improved.   Salvatore Decent. Cornelius Moras, M.D.  Electronically Signed   CHO/MEDQ  D:  11/13/2009  T:  11/14/2009  Job:  [id number]   cc:   Lyn Records, M.D.  Colleen Can. Deborah Chalk, M.D.  Sibyl Parr. Darrick Penna, M.D.

## 2011-03-29 NOTE — Op Note (Signed)
NAMEKOHEN, REITHER               ACCOUNT NO.:  [id number]   MEDICAL RECORD NO.:  [id number]          PATIENT TYPE:  AMB   LOCATION:  ENDO                         FACILITY:  [facility]   PHYSICIAN:  Bernette Redbird, M.D.   DATE OF BIRTH:  02/20/1951   DATE OF PROCEDURE:  01/25/2005  DATE OF DISCHARGE:                                 OPERATIVE REPORT   PROCEDURE:  Colonoscopy.   INDICATIONS:  A 60 year old with occasional small volume hematochezia in  need of colon cancer screening.   FINDINGS:  Normal exam to the terminal ileum except minimal diverticulosis.   PROCEDURE:  The nature, purpose, and risks of the procedure had been  discussed with the patient who provided written consent. Sedation was  fentanyl 70 mcg and Versed 7 mg IV without arrhythmias or desaturation.  Digital exam of the prostate was unremarkable. The Olympus adjustable  tension pediatric video colonoscope was advanced to the terminal ileum  without difficulty. The TI had a normal appearance and pullback was then  performed.   There was mild scattered diverticulosis, nothing impressive, and apparently  limited to the left side of colon. No polyps, cancer, colitis, or vascular  malformations were observed and retroflexion in the rectum and reinspection  of the rectum were unremarkable. No biopsies were obtained. The patient  tolerated the procedure well and there no apparent complications.   IMPRESSION:  1.  Rectal bleeding, without obvious source seen on current exam. It is      presumably of hemorrhoidal origin, although the patient did not have      overtly pathologic hemorrhoids seen.  2.  Minimal diverticulosis.   PLAN:  Consider flexible sigmoidoscopy in five years for continued  screening.      RB/MEDQ  D:  01/25/2005  T:  01/25/2005  Job:  [id number]   cc:   Sibyl Parr. Darrick Penna, M.D.  Fax: [phone]

## 2011-03-29 NOTE — Letter (Signed)
November 14, 2009   [employer]  [address] [address]  [place], [id number]   Re:  GERRAD, WELKER                 DOB:  10-Aug-1951   Regarding e-ticket #[id number].   To Whom It May Concern:   Please be advised that Mr. Mccrystal was hospitalized under my care in  December 2010 for an acute myocardial infarction (heart attack) that  required emergency open heart surgery.  As a result, he was unable to  travel on the [employer] referenced above.  It is my  opinion that the full price of his ticket should be refunded.  Please do  not hesitate to contact me if you have any questions.    Sincerely,   Salvatore Decent. Cornelius Moras, M.D.  Electronically Signed   CHO/MEDQ  D:  11/14/2009  T:  11/15/2009  Job:  [id number]   cc:   Myrlene Broker

## 2012-01-06 ENCOUNTER — Ambulatory Visit (INDEPENDENT_AMBULATORY_CARE_PROVIDER_SITE_OTHER): Payer: 59 | Admitting: Internal Medicine

## 2012-01-06 ENCOUNTER — Encounter: Payer: Self-pay | Admitting: Internal Medicine

## 2012-01-06 ENCOUNTER — Encounter: Payer: Self-pay | Admitting: *Deleted

## 2012-01-06 DIAGNOSIS — IMO0002 Reserved for concepts with insufficient information to code with codable children: Secondary | ICD-10-CM

## 2012-01-06 DIAGNOSIS — Z951 Presence of aortocoronary bypass graft: Secondary | ICD-10-CM

## 2012-01-06 DIAGNOSIS — Z7189 Other specified counseling: Secondary | ICD-10-CM

## 2012-01-06 DIAGNOSIS — I2510 Atherosclerotic heart disease of native coronary artery without angina pectoris: Secondary | ICD-10-CM

## 2012-01-06 DIAGNOSIS — I219 Acute myocardial infarction, unspecified: Secondary | ICD-10-CM

## 2012-01-06 DIAGNOSIS — I25709 Atherosclerosis of coronary artery bypass graft(s), unspecified, with unspecified angina pectoris: Secondary | ICD-10-CM | POA: Insufficient documentation

## 2012-01-06 NOTE — Progress Notes (Signed)
Patient ID: Isaac [name], male   DOB: September 22, 1951, 61 y.o.   MRN: [id number]  INFECTIOUS DISEASE PROGRESS NOTE    Patient Active Problem List  Diagnoses  . PLANTAR WART  . SKIN CANCER  . TREMOR, ESSENTIAL/FAMILIAL  . ATRIAL FIBRILLATION  . DIVERTICULITIS OF COLON, NOS  . ECZEMA  . OSTEOARTHROSIS, LOCAL, SCND, HAND  . DEGENERATIVE JOINT DISEASE, HANDS  . 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 CERVICAL SPINE DISORDER, NOS  . METATARSALGIA  . ELEVATED BP READING WITHOUT DX HYPERTENSION  . CAD (coronary artery disease)  . MI (myocardial infarction)  . S/P CABG (coronary artery bypass graft)  . Advice or immunization for travel    Outpatient Encounter Prescriptions as of 01/06/2012  Medication Sig Dispense Refill  . aspirin 81 MG tablet Take 81 mg by mouth daily with breakfast.      . diclofenac sodium (VOLTAREN) 1 % GEL 1 gram four times per day.       . escitalopram (LEXAPRO) 10 MG tablet Take 10 mg by mouth daily with supper.      . metoprolol (TOPROL-XL) 50 MG 24 hr tablet Take 50 mg by mouth daily. As directed.       . metoprolol tartrate (LOPRESSOR) 25 MG tablet Take 12.5 mg by mouth 2 (two) times daily with a meal.      . olmesartan (BENICAR) 20 MG tablet Take 10 mg by mouth daily with breakfast.      . pantoprazole (PROTONIX) 40 MG tablet Take 40 mg by mouth daily before breakfast.      . rosuvastatin (CRESTOR) 10 MG tablet Take 10 mg by mouth daily with supper.      . tadalafil (CIALIS) 5 MG tablet Take 5 mg by mouth every other day.      . Tamsulosin HCl (FLOMAX) 0.4 MG CAPS Take 0.4 mg by mouth daily after supper.      . warfarin (COUMADIN) 2.5 MG tablet Take 2.5 mg by mouth. Take 1 by mouth on Wednesday and 1 1/2 the other days of the week.           Subjective: Isaac [name] and his wife Darreld Mclean will be traveling to [place] this coming August. I will fly into [place] then onto [place] and [place] before returning home. She will be on a total of 18 days. They will be staying and  high end tourist locations but will be taking multiple wildlife safaris while there. They have not had extensive travel in the developing world. He went to [place] several years ago but did not have any immunizations prior to that trip. Isaac [name] had a heart attack and underwent bypass surgery last year but has recovered nicely. He has recently retired.  Objective: BP: 103/68 mmHg (02/25 1401) Pulse Rate: 58  (02/25 1401)  General: He appears healthy and in no distress   Assessment: Isaac [name] and Darreld Mclean are up-to-date on standard immunizations. I talked to them about general travel precautions in the developing oral as well as general precautions with regard to food and water borne illness. We've also reviewed standard precautions for reducing mosquito exposure. I suggested that they both received hepatitis A vaccine, oral typhoid vaccine, a booster dose of inactivated polio vaccine and yellow fever vaccine. I talked to them about the potential side effects particularly with yellow fever vaccine. For malaria prophylaxis I have reviewed the available options and suggested using atovaquone proguanil. Since we do not have yellow fever vaccine available here in our clinic  yet I have suggested that they both be seen at the travel clinic across the street and have called to facilitate an appointment with Dr. Kathrine Haddock.  Plan: 1. Travel recommendations as noted above to be reviewed and completed after evaluation by Dr. Yevonne Pax, MD [facility] [phone] pager   [phone] cell 01/06/2012, 4:49 PM

## 2012-05-21 ENCOUNTER — Emergency Department (HOSPITAL_COMMUNITY): Payer: 59

## 2012-05-21 ENCOUNTER — Observation Stay (HOSPITAL_COMMUNITY)
Admission: EM | Admit: 2012-05-21 | Discharge: 2012-05-22 | Disposition: A | Discharge status: Home / Self Care | Payer: 59 | Attending: Interventional Cardiology | Admitting: Interventional Cardiology

## 2012-05-21 ENCOUNTER — Encounter (HOSPITAL_COMMUNITY): Payer: Self-pay | Admitting: Nurse Practitioner

## 2012-05-21 DIAGNOSIS — I25709 Atherosclerosis of coronary artery bypass graft(s), unspecified, with unspecified angina pectoris: Secondary | ICD-10-CM | POA: Diagnosis present

## 2012-05-21 DIAGNOSIS — Z7982 Long term (current) use of aspirin: Secondary | ICD-10-CM | POA: Insufficient documentation

## 2012-05-21 DIAGNOSIS — I209 Angina pectoris, unspecified: Secondary | ICD-10-CM | POA: Insufficient documentation

## 2012-05-21 DIAGNOSIS — Z7901 Long term (current) use of anticoagulants: Secondary | ICD-10-CM | POA: Insufficient documentation

## 2012-05-21 DIAGNOSIS — I252 Old myocardial infarction: Secondary | ICD-10-CM | POA: Insufficient documentation

## 2012-05-21 DIAGNOSIS — I25810 Atherosclerosis of coronary artery bypass graft(s) without angina pectoris: Principal | ICD-10-CM | POA: Insufficient documentation

## 2012-05-21 DIAGNOSIS — I4891 Unspecified atrial fibrillation: Secondary | ICD-10-CM | POA: Diagnosis present

## 2012-05-21 DIAGNOSIS — R079 Chest pain, unspecified: Secondary | ICD-10-CM | POA: Diagnosis present

## 2012-05-21 DIAGNOSIS — Z951 Presence of aortocoronary bypass graft: Secondary | ICD-10-CM

## 2012-05-21 HISTORY — DX: Cardiac arrhythmia, unspecified: I49.9

## 2012-05-21 HISTORY — DX: Atherosclerotic heart disease of native coronary artery without angina pectoris: I25.10

## 2012-05-21 LAB — URINALYSIS, ROUTINE W REFLEX MICROSCOPIC
Bilirubin Urine: NEGATIVE
Nitrite: NEGATIVE
Specific Gravity, Urine: 1.01 (ref 1.005–1.030)
pH: 6 (ref 5.0–8.0)

## 2012-05-21 LAB — CBC
MCHC: 33.7 g/dL (ref 30.0–36.0)
Platelets: 163 10*3/uL (ref 150–400)
RBC: 4.7 MIL/uL (ref 4.22–5.81)
RDW: 12.7 % (ref 11.5–15.5)
WBC: 6 10*3/uL (ref 4.0–10.5)

## 2012-05-21 LAB — PROTIME-INR
INR: 3.15 — ABNORMAL HIGH (ref 0.00–1.49)
Prothrombin Time: 32.8 seconds — ABNORMAL HIGH (ref 11.6–15.2)

## 2012-05-21 LAB — DIFFERENTIAL
Basophils Absolute: 0 10*3/uL (ref 0.0–0.1)
Basophils Relative: 1 % (ref 0–1)
Eosinophils Absolute: 0.3 10*3/uL (ref 0.0–0.7)
Neutro Abs: 3.2 10*3/uL (ref 1.7–7.7)
Neutrophils Relative %: 62 % (ref 43–77)

## 2012-05-21 LAB — COMPREHENSIVE METABOLIC PANEL
ALT: 24 U/L (ref 0–53)
AST: 36 U/L (ref 0–37)
AST: 26 U/L (ref 0–37)
Albumin: 3.5 g/dL (ref 3.5–5.2)
Alkaline Phosphatase: 64 U/L (ref 39–117)
CO2: 21 mEq/L (ref 19–32)
Calcium: 9.6 mg/dL (ref 8.4–10.5)
Chloride: 105 mEq/L (ref 96–112)
Chloride: 111 mEq/L (ref 96–112)
Creatinine, Ser: 1.01 mg/dL (ref 0.50–1.35)
GFR calc non Af Amer: 87 mL/min — ABNORMAL LOW (ref >90)
Potassium: 3.5 mEq/L (ref 3.5–5.1)
Sodium: 139 mEq/L (ref 135–145)
Total Bilirubin: 0.5 mg/dL (ref 0.3–1.2)
Total Bilirubin: 0.4 mg/dL (ref 0.3–1.2)
Total Protein: 6.2 g/dL (ref 6.0–8.3)

## 2012-05-21 LAB — CARDIAC PANEL(CRET KIN+CKTOT+MB+TROPI)
CK, MB: 4.6 ng/mL — ABNORMAL HIGH (ref 0.3–4.0)
Relative Index: 2.2 (ref 0.0–2.5)
Total CK: 206 U/L (ref 7–232)
Troponin I: <0.3 ng/mL (ref <0.30)

## 2012-05-21 LAB — TROPONIN I: Troponin I: <0.3 ng/mL (ref <0.30)

## 2012-05-21 LAB — APTT
aPTT: 54 seconds — ABNORMAL HIGH (ref 24–37)
aPTT: 56 seconds — ABNORMAL HIGH (ref 24–37)

## 2012-05-21 LAB — POCT I-STAT TROPONIN I: Troponin i, poc: 0.01 ng/mL (ref 0.00–0.08)

## 2012-05-21 MED ORDER — SODIUM CHLORIDE 0.9 % IV SOLN
1000.0000 mL | INTRAVENOUS | Status: DC
  Administered 2012-05-21: 1000 mL via INTRAVENOUS

## 2012-05-21 MED ORDER — ASPIRIN 81 MG PO CHEW
324.0000 mg | CHEWABLE_TABLET | Freq: Once | ORAL | Status: AC
  Administered 2012-05-21: 324 mg via ORAL
  Filled 2012-05-21: qty 4

## 2012-05-21 MED ORDER — NITROGLYCERIN 0.4 MG SL SUBL: 0.4000 mg | SUBLINGUAL_TABLET | SUBLINGUAL | Status: DC | PRN

## 2012-05-21 MED ORDER — NITROGLYCERIN 0.4 MG SL SUBL
0.4000 mg | SUBLINGUAL_TABLET | SUBLINGUAL | Status: DC | PRN
  Administered 2012-05-21 (×2): 0.4 mg via SUBLINGUAL
  Filled 2012-05-21: qty 25

## 2012-05-21 MED ORDER — ASPIRIN 81 MG PO CHEW: 324.0000 mg | CHEWABLE_TABLET | ORAL | Status: AC

## 2012-05-21 MED ORDER — ASPIRIN 300 MG RE SUPP
300.0000 mg | RECTAL | Status: AC
  Filled 2012-05-21: qty 1

## 2012-05-21 MED ORDER — ATORVASTATIN CALCIUM 40 MG PO TABS
40.0000 mg | ORAL_TABLET | Freq: Every day | ORAL | Status: DC
  Administered 2012-05-21: 40 mg via ORAL
  Filled 2012-05-21 (×2): qty 1

## 2012-05-21 MED ORDER — TAMSULOSIN HCL 0.4 MG PO CAPS
0.4000 mg | ORAL_CAPSULE | Freq: Every day | ORAL | Status: DC
  Administered 2012-05-21: 0.4 mg via ORAL
  Filled 2012-05-21 (×2): qty 1

## 2012-05-21 MED ORDER — METOPROLOL TARTRATE 12.5 MG HALF TABLET
12.5000 mg | ORAL_TABLET | Freq: Two times a day (BID) | ORAL | Status: DC
  Administered 2012-05-21 – 2012-05-22 (×2): 12.5 mg via ORAL
  Filled 2012-05-21 (×3): qty 1

## 2012-05-21 MED ORDER — PANTOPRAZOLE SODIUM 40 MG PO TBEC
40.0000 mg | DELAYED_RELEASE_TABLET | Freq: Every day | ORAL | Status: DC
  Administered 2012-05-22: 40 mg via ORAL
  Filled 2012-05-21: qty 1

## 2012-05-21 MED ORDER — ASPIRIN 81 MG PO TABS: 81.0000 mg | ORAL_TABLET | Freq: Every day | ORAL | Status: DC

## 2012-05-21 MED ORDER — ONDANSETRON HCL 4 MG/2ML IJ SOLN: 4.0000 mg | Freq: Four times a day (QID) | INTRAMUSCULAR | Status: DC | PRN

## 2012-05-21 MED ORDER — ACETAMINOPHEN 325 MG PO TABS: 650.0000 mg | ORAL_TABLET | ORAL | Status: DC | PRN

## 2012-05-21 MED ORDER — ASPIRIN 81 MG PO CHEW
81.0000 mg | CHEWABLE_TABLET | Freq: Every day | ORAL | Status: DC
  Administered 2012-05-22: 81 mg via ORAL
  Filled 2012-05-21: qty 1

## 2012-05-21 MED ORDER — ASPIRIN 325 MG PO TABS: 325.0000 mg | ORAL_TABLET | ORAL | Status: DC

## 2012-05-21 NOTE — ED Provider Notes (Signed)
History     CSN: [id number]  Arrival date & time 05/21/12  1056   First MD Initiated Contact with Patient 05/21/12 1107      Chief Complaint  Patient presents with  . Chest Pain    (Consider location/radiation/quality/duration/timing/severity/associated sxs/prior treatment) HPI Comments: Patient is a 61 year old man who had a heart attack in 2010, treated with coronary artery bypass grafting. Yesterday he had a tightness in his chest, which was done very severe. He continued his regular activities, playing tennis yesterday evening. This morning he still felt mild distress and therefore he called his cardiologist, who referred him to the ED for evaluation.  Patient is a 61 y.o. male presenting with chest pain.  Chest Pain The chest pain began yesterday. Chest pain occurs intermittently. The chest pain is unchanged. At its most intense, the pain is at 2/10. The pain is currently at 2/10. The quality of the pain is described as tightness. Pertinent negatives for primary symptoms include no syncope, no shortness of breath, no palpitations and no dizziness.  Pertinent negatives for associated symptoms include no near-syncope. He tried nitroglycerin for the symptoms.  His past medical history is significant for CAD and MI.  Procedure history is positive for cardiac catheterization.     Past Medical History  Diagnosis Date  . Myocardial infarction     Past Surgical History  Procedure Date  . Cholecystectomy   . Appendectomy     History reviewed. No pertinent family history.  History  Substance Use Topics  . Smoking status: Never Smoker   . Smokeless tobacco: Not on file  . Alcohol Use: Yes     3      Review of Systems  Constitutional: Negative.   HENT: Negative.   Eyes: Negative.   Respiratory: Negative for shortness of breath.   Cardiovascular: Positive for chest pain. Negative for palpitations, syncope and near-syncope.  Gastrointestinal: Negative.   Genitourinary:  Negative.   Musculoskeletal: Negative.   Skin: Negative.   Neurological: Negative.  Negative for dizziness.  Psychiatric/Behavioral: Negative.     Allergies  Review of patient's allergies indicates no known allergies.  Home Medications   Current Outpatient Rx  Name Route Sig Dispense Refill  . ASPIRIN 81 MG PO TABS Oral Take 81 mg by mouth daily with breakfast.    . METOPROLOL TARTRATE 25 MG PO TABS Oral Take 12.5 mg by mouth 2 (two) times daily.    Marland Kitchen PANTOPRAZOLE SODIUM 40 MG PO TBEC Oral Take 40 mg by mouth daily before breakfast.    . ROSUVASTATIN CALCIUM 10 MG PO TABS Oral Take 10 mg by mouth daily with supper.    Marland Kitchen TADALAFIL 5 MG PO TABS Oral Take 5 mg by mouth every other day.    Marland Kitchen TAMSULOSIN HCL 0.4 MG PO CAPS Oral Take 0.4 mg by mouth daily after supper.    . WARFARIN SODIUM 2.5 MG PO TABS  3.75-5 mg daily. Take 2 tablets by mouth on Mondays, Wednesdays, and Fridays.  Take 1.5 tablets all other days.      BP 122/74  Pulse 61  Temp 97.8 F (36.6 C) (Oral)  Resp 16  SpO2 96%  Physical Exam  Nursing note and vitals reviewed. Constitutional: He is oriented to person, place, and time. He appears well-developed and well-nourished. No distress.  HENT:  Head: Normocephalic and atraumatic.  Right Ear: External ear normal.  Left Ear: External ear normal.  Mouth/Throat: Oropharynx is clear and moist.  Eyes: Conjunctivae and EOM  are normal. Pupils are equal, round, and reactive to light.  Neck: Normal range of motion. Neck supple.  Cardiovascular: Normal rate, regular rhythm and normal heart sounds.   Pulmonary/Chest: Breath sounds normal.  Abdominal: Soft. Bowel sounds are normal.  Musculoskeletal: Normal range of motion. He exhibits no edema and no tenderness.  Neurological: He is alert and oriented to person, place, and time.       No sensory or motor deficit.   Skin: Skin is warm and dry.  Psychiatric: He has a normal mood and affect. His behavior is normal.    ED  Course  Procedures (including critical care time)   Labs Reviewed  CBC  BASIC METABOLIC PANEL  TROPONIN I  CBC  COMPREHENSIVE METABOLIC PANEL  PROTIME-INR  APTT  URINALYSIS, ROUTINE W REFLEX MICROSCOPIC   11:19 AM  Date: 05/21/2012  Rate: 59  Rhythm: sinus bradycardia  QRS Axis: right  Intervals: normal QRS:  Poor R wave progression in precordial leads suggests old anterior myocardial infarction.  ST/T Wave abnormalities: normal  Conduction Disutrbances:none  Narrative Interpretation: Abnormal EKG  Old EKG Reviewed: unchanged   1:37 PM Results for orders placed during the hospital encounter of 05/21/12  TROPONIN I      Component Value Range   Troponin I <0.30  <0.30 ng/mL  CBC      Component Value Range   WBC 6.0  4.0 - 10.5 K/uL   RBC 4.70  4.22 - 5.81 MIL/uL   Hemoglobin 14.5  13.0 - 17.0 g/dL   HCT 16.1  09.6 - 04.5 %   MCV 90.9  78.0 - 100.0 fL   MCH 30.9  26.0 - 34.0 pg   MCHC 34.0  30.0 - 36.0 g/dL   RDW 40.9  81.1 - 91.4 %   Platelets 163  150 - 400 K/uL  COMPREHENSIVE METABOLIC PANEL      Component Value Range   Sodium 139  135 - 145 mEq/L   Potassium 4.1  3.5 - 5.1 mEq/L   Chloride 105  96 - 112 mEq/L   CO2 21  19 - 32 mEq/L   Glucose, Bld 97  70 - 99 mg/dL   BUN 15  6 - 23 mg/dL   Creatinine, Ser 7.82  0.50 - 1.35 mg/dL   Calcium 9.6  8.4 - 95.6 mg/dL   Total Protein 6.9  6.0 - 8.3 g/dL   Albumin 3.8  3.5 - 5.2 g/dL   AST 36  0 - 37 U/L   ALT 24  0 - 53 U/L   Alkaline Phosphatase 67  39 - 117 U/L   Total Bilirubin 0.5  0.3 - 1.2 mg/dL   GFR calc non Af Amer 87 (*) >90 mL/min   GFR calc Af Amer >90  >90 mL/min  PROTIME-INR      Component Value Range   Prothrombin Time 31.5 (*) 11.6 - 15.2 seconds   INR 2.99 (*) 0.00 - 1.49  APTT      Component Value Range   aPTT 54 (*) 24 - 37 seconds  URINALYSIS, ROUTINE W REFLEX MICROSCOPIC      Component Value Range   Color, Urine YELLOW  YELLOW   APPearance CLEAR  CLEAR   Specific Gravity, Urine  1.010  1.005 - 1.030   pH 6.0  5.0 - 8.0   Glucose, UA NEGATIVE  NEGATIVE mg/dL   Hgb urine dipstick NEGATIVE  NEGATIVE   Bilirubin Urine NEGATIVE  NEGATIVE   Ketones, ur  NEGATIVE  NEGATIVE mg/dL   Protein, ur NEGATIVE  NEGATIVE mg/dL   Urobilinogen, UA 0.2  0.0 - 1.0 mg/dL   Nitrite NEGATIVE  NEGATIVE   Leukocytes, UA NEGATIVE  NEGATIVE  POCT I-STAT TROPONIN I      Component Value Range   Troponin i, poc 0.01  0.00 - 0.08 ng/mL   Comment 3            Dg Chest Port 1 View  05/21/2012  *RADIOLOGY REPORT*  Clinical Data: Chest pain.  PORTABLE CHEST - 1 VIEW  Comparison: PA and lateral chest 09/10/2011.  Findings: There is cardiomegaly.  The patient its status post CABG. The lungs are clear.  No pneumothorax or effusion.  IMPRESSION: Cardiomegaly without acute disease.  Original Report Authenticated By: Bernadene Bell. Maricela Curet, M.D.    Lab tests were reasurringly normal.  Pt was seen by Dr. Dione Housekeeper, who is admitting him for overnight observation.  1. Angina pectoris          Carleene Cooper III, MD 05/21/12 [phone]

## 2012-05-21 NOTE — H&P (Signed)
Admit date: 05/21/2012 Primary Physician  : Dr. Jerelyn Scott Primary Cardiologist  Dr. Verdis Prime  CC: Chest pain  HPI: 61 year old male with coronary artery disease status post myocardial infarction with emergent bypass surgery in 10/2009 x2 with LIMA to LAD and SVG to diagonal by Dr. Cornelius Moras, chronic atrial fibrillation on full anticoagulation with warfarin, ejection fraction 47% from cardiac MRI, here with chest discomfort. He states that over the past week he has had intermittent chest discomfort that has been mild in severity substernal radiating across his chest wall. Yesterday it seemed to increase in intensity off and on throughout the day. No associated diaphoresis, shortness of breath, syncope, palpitations. He went to play doubles tennis as he normally does on Wednesday night without any difficulty, no escalation of symptoms with exertion. However, when he was at home later on that evening he still felt persistent chest discomfort. He took one nitroglycerin and felt as though there was some improvement. After this morning, still feeling his chest discomfort, he decided to come into the emergency department after being counseled by Dr. Michaelle Copas nurse. He states that the thing that worried him the most was that he felt similar to this prior to his MI in 2010. He used the word "premonition."  While here, he is received 2 nitroglycerin and his pain is essentially gone he states. His EKG shows no obvious ST segment changes. Compared to prior EKG from December of 2010, his T-wave inversion in lead V3 through V5 is now normalized. Poor R wave progression is present. Possible old anterior infarct pattern.  As an aside, he stated that under the supervision of Dr. Kevan Ny, over the last month he has discontinued his Lexapro.     PMH:   Past Medical History  Diagnosis Date  . Myocardial infarction     PSH:   Past Surgical History  Procedure Date  . Cholecystectomy   . Appendectomy    Allergies:   Review of patient's allergies indicates no known allergies. Prior to Admit Meds:   (Not in a hospital admission) medication list has been reviewed. Fam HX:   History reviewed. No pertinent family history. Social HX:    History   Social History  . Marital Status: Married    Spouse Name: N/A    Number of Children: N/A  . Years of Education: N/A   Occupational History  . Not on file.   Social History Main Topics  . Smoking status: Never Smoker   . Smokeless tobacco: Not on file  . Alcohol Use: Yes     3  . Drug Use: Not on file  . Sexually Active: Not on file   Other Topics Concern  . Not on file   Social History Narrative  . No narrative on file     ROS:  He denies any fevers, chills, cough, orthopnea, PND, syncope, palpitations, bleeding. All 11 ROS were addressed and are negative except what is stated in the HPI  Physical Exam: Blood pressure 127/88, pulse 61, temperature 97.8 F (36.6 C), temperature source Oral, resp. rate 14, height 5\' 10"  (1.778 m), weight 74.39 kg (164 lb), SpO2 98.00%.    General: Well developed, well nourished, in no acute distress Head: Eyes PERRLA, No xanthomas.   Normal cephalic and atramatic  Lungs:   Clear bilaterally to auscultation and percussion. Normal respiratory effort. No wheezes, no rales. Heart:   HRRR S1 S2 Pulses are 2+ & equal. No appreciable murmurs. Chest scar well healed.  No carotid bruit. No JVD.  No abdominal bruits.  Abdomen: Bowel sounds are positive, abdomen soft and non-tender without masses. No hepatosplenomegaly. Msk:  Back normal. Normal strength and tone for age. Extremities:   No clubbing, cyanosis or edema.  DP +1 Neuro: Alert and oriented X 3, non-focal, MAE x 4 GU: Deferred Rectal: Deferred Psych:  Good affect, responds appropriately    Labs:   Lab Results  Component Value Date   WBC 6.0 05/21/2012   HGB 14.5 05/21/2012   HCT 42.7 05/21/2012   MCV 90.9 05/21/2012   PLT 163 05/21/2012    Lab  05/21/12 1127  NA 139  K 4.1  CL 105  CO2 21  BUN 15  CREATININE 0.95  CALCIUM 9.6  PROT 6.9  BILITOT 0.5  ALKPHOS 67  ALT 24  AST 36  GLUCOSE 97   No results found for this basename: PTT   Lab Results  Component Value Date   INR 2.99* 05/21/2012   INR 2.89* 11/06/2009   INR 2.49* 11/04/2009   Lab Results  Component Value Date   CKTOTAL 1285* 10/22/2009   CKMB 118.5* 10/22/2009   TROPONINI <0.30 05/21/2012     Lab Results  Component Value Date   CHOL  Value: 97        ATP III CLASSIFICATION:  <200     mg/dL   Desirable  161-096  mg/dL   Borderline High  >=045    mg/dL   High        40/98/1191   Lab Results  Component Value Date   HDL 34* 10/24/2009   Lab Results  Component Value Date   LDLCALC  Value: 41        Total Cholesterol/HDL:CHD Risk Coronary Heart Disease Risk Table                     Men   Women  1/2 Average Risk   3.4   3.3  Average Risk       5.0   4.4  2 X Average Risk   9.6   7.1  3 X Average Risk  23.4   11.0        Use the calculated Patient Ratio above and the CHD Risk Table to determine the patient's CHD Risk.        ATP III CLASSIFICATION (LDL):  <100     mg/dL   Optimal  478-295  mg/dL   Near or Above                    Optimal  130-159  mg/dL   Borderline  621-308  mg/dL   High  >657     mg/dL   Very High 84/69/6295   Lab Results  Component Value Date   TRIG 112 10/24/2009   Lab Results  Component Value Date   CHOLHDL 2.9 10/24/2009   No results found for this basename: LDLDIRECT      Radiology:  Dg Chest Port 1 View  05/21/2012  *RADIOLOGY REPORT*  Clinical Data: Chest pain.  PORTABLE CHEST - 1 VIEW  Comparison: PA and lateral chest 09/10/2011.  Findings: There is cardiomegaly.  The patient its status post CABG. The lungs are clear.  No pneumothorax or effusion.  IMPRESSION: Cardiomegaly without acute disease.  Original Report Authenticated By: Bernadene Bell. Maricela Curet, M.D.   Personally viewed.   EKG:  Normal sinus rhythm/sinus bradycardia  rate 59 with poor R-wave progression, normalization of  T waves in lead V3 through V6 when compared to prior EKG. Personally viewed.  Cardiac MRI 4/11-apical scar with ejection fraction of 47%   ASSESSMENT/PLAN:   61 year old with coronary artery disease status post bypass, prior myocardial infarction, hyperlipidemia here with chest discomfort.  1. Chest pain - he was concerned because this was similar to the way he felt prior to his infarction in 2010. Because of this, we will continue to observe overnight checking cardiac biomarkers. Thus far his troponin is normal and reassuring. His EKG also does not show any acute ischemic changes. Continue with his current home medications including aspirin. I will hold his Coumadin, INR 2.9, in case Dr. Katrinka Blazing wishes to perform cardiac catheterization in the future. I've explained to him that if his cardiac biomarkers are normal and he remains symptom free, he may undergo stress testing instead. I also explained that this is under the discretion of Dr. Katrinka Blazing.  2. Coronary artery disease-post bypass. This was in the setting of myocardial infarction. 2 vessels were bypassed, LAD as well as diagonal.  3. Atrial fibrillation-paroxysmal, currently in normal rhythm. He states that in February of this year his amiodarone was discontinued.  I have discussed with Dr. Gloriajean Dell, Loraine Leriche, MD  05/21/2012  12:49 PM

## 2012-05-21 NOTE — ED Notes (Signed)
Pt reports he is no longer having chest pain.

## 2012-05-21 NOTE — ED Notes (Signed)
Pt reports he woke yesterday and noticed some tightness in his chest, persisted throughout the day, and was relieved by nitro last night. Since the pain persisted today he decided to come for eval. Hx MI

## 2012-05-22 ENCOUNTER — Other Ambulatory Visit (HOSPITAL_COMMUNITY): Payer: Self-pay | Admitting: Interventional Cardiology

## 2012-05-22 DIAGNOSIS — R079 Chest pain, unspecified: Secondary | ICD-10-CM

## 2012-05-22 LAB — CBC
Platelets: 157 10*3/uL (ref 150–400)
RDW: 12.7 % (ref 11.5–15.5)
WBC: 5.6 10*3/uL (ref 4.0–10.5)

## 2012-05-22 LAB — BASIC METABOLIC PANEL
Chloride: 109 mEq/L (ref 96–112)
Creatinine, Ser: 1.04 mg/dL (ref 0.50–1.35)
GFR calc Af Amer: 86 mL/min — ABNORMAL LOW (ref >90)
Potassium: 3.6 mEq/L (ref 3.5–5.1)

## 2012-05-22 LAB — CARDIAC PANEL(CRET KIN+CKTOT+MB+TROPI)
Relative Index: 2.2 (ref 0.0–2.5)
Troponin I: <0.3 ng/mL (ref <0.30)

## 2012-05-22 LAB — LIPID PANEL
Cholesterol: 114 mg/dL (ref 0–200)
HDL: 39 mg/dL — ABNORMAL LOW (ref >39)
Total CHOL/HDL Ratio: 2.9 RATIO

## 2012-05-22 LAB — HEMOGLOBIN A1C: Mean Plasma Glucose: 108 mg/dL (ref <117)

## 2012-05-22 MED ORDER — NITROGLYCERIN 0.4 MG SL SUBL: 0.4000 mg | SUBLINGUAL_TABLET | SUBLINGUAL | Status: DC | PRN

## 2012-05-22 NOTE — Progress Notes (Signed)
Pt ready for discharge. Discharge instructions given to pt along with prescriptions. Pt verbalized understanding.

## 2012-05-22 NOTE — Discharge Summary (Signed)
Patient ID: Isaac [name] MRN: [id number] DOB/AGE: 17-Jul-1951 61 y.o.  Admit date: 05/21/2012 Discharge date: 05/22/2012  Primary Discharge Diagnosis:  Chest discomfort with MI ruled out. Secondary Discharge Diagnosis:  Coronary artery disease  Hypertension  Paroxysmal atrial fibrillation  Asymptomatic diastolic heart failure  Degenerative disc disease  Significant Diagnostic Studies: None  Consults: None  Hospital Course: Mr. Teare had a 36 hour complaint of precordial discomfort. This was similar in nature to those prior to his presentation with acute myocardial infarction. Nitroglycerin did seem to have some improvement in symptoms associated with use. Physical activity such as playing doubles tennis did not alter the discomfort. Despite having the discomfort he played doubles tennis for up to 2 hours on the day prior to admission. He states that the physical activity actually helped to improve the discomfort.  After admission all cardiac markers, and EKGs were unremarkable.  The baseline BNP did demonstrate slight elevation at 500.   Discharge Exam: Blood pressure 108/70, pulse 60, temperature 97.1 F (36.2 C), temperature source Oral, resp. rate 18, height 5\' 10"  (1.778 m), weight 76.4 kg (168 lb 6.9 oz), SpO2 96.00%.    Cardiac exam is unremarkable  Neck veins not distended  Chest is clear Labs:   Lab Results  Component Value Date   WBC 5.6 05/22/2012   HGB 13.3 05/22/2012   HCT 39.1 05/22/2012   MCV 89.9 05/22/2012   PLT 157 05/22/2012    Lab 05/22/12 0221 05/21/12 1438  NA 142 --  K 3.6 --  CL 109 --  CO2 23 --  BUN 12 --  CREATININE 1.04 --  CALCIUM 8.7 --  PROT -- 6.2  BILITOT -- 0.4  ALKPHOS -- 64  ALT -- 22  AST -- 26  GLUCOSE 93 --   Lab Results  Component Value Date   CKTOTAL 165 05/22/2012   CKMB 3.7 05/22/2012   TROPONINI <0.30 05/22/2012    Lab Results  Component Value Date   CHOL 114 05/22/2012   CHOL  Value: 97        ATP III  CLASSIFICATION:  <200     mg/dL   Desirable  409-811  mg/dL   Borderline High  >=914    mg/dL   High        78/29/5621   Lab Results  Component Value Date   HDL 39* 05/22/2012   HDL 34* 10/24/2009   Lab Results  Component Value Date   LDLCALC 54 05/22/2012   LDLCALC  Value: 41        Total Cholesterol/HDL:CHD Risk Coronary Heart Disease Risk Table                     Men   Women  1/2 Average Risk   3.4   3.3  Average Risk       5.0   4.4  2 X Average Risk   9.6   7.1  3 X Average Risk  23.4   11.0        Use the calculated Patient Ratio above and the CHD Risk Table to determine the patient's CHD Risk.        ATP III CLASSIFICATION (LDL):  <100     mg/dL   Optimal  308-657  mg/dL   Near or Above                    Optimal  130-159  mg/dL   Borderline  160-189  mg/dL   High  >454     mg/dL   Very High 09/81/1914   Lab Results  Component Value Date   TRIG 105 05/22/2012   TRIG 112 10/24/2009   Lab Results  Component Value Date   CHOLHDL 2.9 05/22/2012   CHOLHDL 2.9 10/24/2009   No results found for this basename: LDLDIRECT      Radiology: Study Result     *RADIOLOGY REPORT*  Clinical Data: Chest pain.  PORTABLE CHEST - 1 VIEW  Comparison: PA and lateral chest 09/10/2011.  Findings: There is cardiomegaly. The patient its status post CABG.  The lungs are clear. No pneumothorax or effusion.  IMPRESSION:  Cardiomegaly without acute disease.  Original Report Authenticated By: Bernadene Bell. D'ALESSIO, M.D.    EKG: Normal sinus rhythm QS pattern V1 through V2 consistent with prior anteroseptal infarct  FOLLOW UP PLANS AND APPOINTMENTS  Medication List  As of 05/22/2012  9:03 AM   STOP taking these medications         tadalafil 5 MG tablet         TAKE these medications         aspirin 81 MG tablet   Take 81 mg by mouth daily with breakfast.      metoprolol tartrate 25 MG tablet   Commonly known as: LOPRESSOR   Take 12.5 mg by mouth 2 (two) times daily.      nitroGLYCERIN 0.4  MG SL tablet   Commonly known as: NITROSTAT   Place 1 tablet (0.4 mg total) under the tongue every 5 (five) minutes x 3 doses as needed for chest pain.      pantoprazole 40 MG tablet   Commonly known as: PROTONIX   Take 40 mg by mouth daily before breakfast.      rosuvastatin 10 MG tablet   Commonly known as: CRESTOR   Take 10 mg by mouth daily with supper.      Tamsulosin HCl 0.4 MG Caps   Commonly known as: FLOMAX   Take 0.4 mg by mouth daily after supper.      warfarin 2.5 MG tablet   Commonly known as: COUMADIN   3.75-5 mg daily. Take 2 tablets by mouth on Mondays, Wednesdays, and Fridays.  Take 1.5 tablets all other days.           Follow-up Information    Follow up with Lesleigh Noe, MD. (Office will call with instruction for stress test)    Contact information:   [address] [place] [address] [place] [address] [phone]          BRING ALL MEDICATIONS WITH YOU TO FOLLOW UP APPOINTMENTS  Time spent with patient to include physician time: 15 minutes Signed: Lesleigh Noe 05/22/2012, 9:03 AM

## 2012-05-29 ENCOUNTER — Encounter (HOSPITAL_COMMUNITY)
Admission: RE | Admit: 2012-05-29 | Discharge: 2012-05-29 | Disposition: A | Discharge status: Home / Self Care | Payer: 59 | Source: Ambulatory Visit | Attending: Interventional Cardiology | Admitting: Interventional Cardiology

## 2012-05-29 ENCOUNTER — Other Ambulatory Visit: Payer: Self-pay

## 2012-05-29 ENCOUNTER — Ambulatory Visit (HOSPITAL_COMMUNITY)
Admission: RE | Admit: 2012-05-29 | Discharge: 2012-05-29 | Disposition: A | Discharge status: Home / Self Care | Payer: 59 | Source: Ambulatory Visit | Attending: Interventional Cardiology | Admitting: Interventional Cardiology

## 2012-05-29 DIAGNOSIS — R079 Chest pain, unspecified: Secondary | ICD-10-CM | POA: Insufficient documentation

## 2012-05-29 MED ORDER — TECHNETIUM TC 99M TETROFOSMIN IV KIT: 10.0000 | PACK | Freq: Once | INTRAVENOUS | Status: DC | PRN

## 2012-05-29 MED ORDER — TECHNETIUM TC 99M TETROFOSMIN IV KIT
10.0000 | PACK | Freq: Once | INTRAVENOUS | Status: AC | PRN
  Administered 2012-05-29: 10 via INTRAVENOUS

## 2012-05-29 MED ORDER — TECHNETIUM TC 99M TETROFOSMIN IV KIT
30.0000 | PACK | Freq: Once | INTRAVENOUS | Status: AC | PRN
  Administered 2012-05-29: 30 via INTRAVENOUS

## 2013-08-24 ENCOUNTER — Other Ambulatory Visit: Payer: Self-pay | Admitting: Interventional Cardiology

## 2013-09-03 ENCOUNTER — Ambulatory Visit (INDEPENDENT_AMBULATORY_CARE_PROVIDER_SITE_OTHER): Payer: 59 | Admitting: Pharmacist

## 2013-09-03 DIAGNOSIS — I4891 Unspecified atrial fibrillation: Secondary | ICD-10-CM

## 2013-09-23 ENCOUNTER — Ambulatory Visit: Payer: 59 | Admitting: Interventional Cardiology

## 2013-09-26 ENCOUNTER — Encounter: Payer: Self-pay | Admitting: *Deleted

## 2013-09-26 ENCOUNTER — Encounter: Payer: Self-pay | Admitting: Interventional Cardiology

## 2013-09-28 ENCOUNTER — Encounter: Payer: Self-pay | Admitting: Interventional Cardiology

## 2013-09-28 ENCOUNTER — Ambulatory Visit (INDEPENDENT_AMBULATORY_CARE_PROVIDER_SITE_OTHER): Payer: 59 | Admitting: Interventional Cardiology

## 2013-09-28 ENCOUNTER — Ambulatory Visit (INDEPENDENT_AMBULATORY_CARE_PROVIDER_SITE_OTHER): Payer: 59 | Admitting: Pharmacist

## 2013-09-28 VITALS — BP 110/68 | HR 52 | Ht 70.0 in | Wt 168.0 lb

## 2013-09-28 DIAGNOSIS — I4891 Unspecified atrial fibrillation: Secondary | ICD-10-CM

## 2013-09-28 DIAGNOSIS — I5042 Chronic combined systolic (congestive) and diastolic (congestive) heart failure: Secondary | ICD-10-CM | POA: Insufficient documentation

## 2013-09-28 DIAGNOSIS — G250 Essential tremor: Secondary | ICD-10-CM

## 2013-09-28 DIAGNOSIS — Z951 Presence of aortocoronary bypass graft: Secondary | ICD-10-CM

## 2013-09-28 DIAGNOSIS — I509 Heart failure, unspecified: Secondary | ICD-10-CM

## 2013-09-28 DIAGNOSIS — I219 Acute myocardial infarction, unspecified: Secondary | ICD-10-CM

## 2013-09-28 DIAGNOSIS — E785 Hyperlipidemia, unspecified: Secondary | ICD-10-CM

## 2013-09-28 DIAGNOSIS — I2510 Atherosclerotic heart disease of native coronary artery without angina pectoris: Secondary | ICD-10-CM

## 2013-09-28 DIAGNOSIS — Z7901 Long term (current) use of anticoagulants: Secondary | ICD-10-CM

## 2013-09-28 LAB — POCT INR: INR: 2.4

## 2013-09-28 NOTE — Patient Instructions (Signed)
Your physician recommends that you continue on your current medications as directed. Please refer to the Current Medication list given to you today.  Your physician wants you to follow-up in: 1 year You will receive a reminder letter in the mail two months in advance. If you don't receive a letter, please call our office to schedule the follow-up appointment.  Continue to maintain a healthy lifestyle with diet and exercise

## 2013-09-28 NOTE — Progress Notes (Signed)
Patient ID: JAEDYN LARD, male   DOB: [birth year], 62 y.o.   MRN: [id number]    [address] [place], [place]  [address] Phone: [phone] Fax:  [phone]  Date:  09/28/2013   ID:  Aaron, Bostwick December 17, 1950, MRN [id number]  PCP:  Enid Baas, MD   ASSESSMENT:  1. Coronary artery disease status post coronary bypass grafting. No anginal complaints in the interim since last office visit 2. Chronic combined systolic and diastolic heart failure, asymptomatic. Last EF 41% 3. Paroxysmal atrial fibrillation, currently in normal sinus rhythm. In frequent in occurrence 4. Chronic anticoagulation therapy, Coumadin  PLAN:  1. Continue current medical regimen 2. Notify if recurrent atrial fibrillation that is prolonged 3. Consider switching from Coumadin to Eliquis 4. Lipids to be followed by primary physician   SUBJECTIVE: Isaac [name] is a 62 y.o. male who is doing well. 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 He denies episodes of angina. He has not had syncope. He denies dyspnea. Is no peripheral edema. He has had episodes of atrial fibrillation earlier this year during a time of emotional stress. He has not had syncope. He has not needed nitroglycerin. No medication side effects   Wt Readings from Last 3 Encounters:  09/28/13 168 lb (76.204 kg)  05/22/12 168 lb 6.9 oz (76.4 kg)  01/06/12 172 lb 12 oz (78.359 kg)     Past Medical History  Diagnosis Date  . Myocardial infarction   . Coronary artery disease   . Anginal pain   . Dysrhythmia   . CHF (congestive heart failure)   . Chest pain, unspecified   . S/P CABG (coronary artery bypass graft)   . METATARSALGIA   . CERVICAL SPINE DISORDER, NOS   . 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 OSTEOARTHROSIS, LOCAL, SCND, HAND     Current Outpatient Prescriptions  Medication Sig Dispense Refill  . aspirin 81 MG tablet Take 81 mg by mouth daily with  breakfast.      . metoprolol tartrate (LOPRESSOR) 25 MG tablet Take 12.5 mg by mouth 2 (two) times daily.      . nitroGLYCERIN (NITROSTAT) 0.4 MG SL tablet Place 1 tablet (0.4 mg total) under the tongue every 5 (five) minutes x 3 doses as needed for chest pain.  25 tablet  3  . rosuvastatin (CRESTOR) 10 MG tablet Take 10 mg by mouth daily with supper.      . Tamsulosin HCl (FLOMAX) 0.4 MG CAPS Take 0.4 mg by mouth daily after supper.      . warfarin (COUMADIN) 2.5 MG tablet 3.75-5 mg daily. Take 2 tablets by mouth on Mondays, Wednesdays, and Fridays.  Take 1.5 tablets all other days.       No current facility-administered medications for this visit.    Allergies:   No Known Allergies  Social History:  The patient  reports that he has never smoked. He has never used smokeless tobacco. He reports that he drinks alcohol. He reports that he does not use illicit drugs.   ROS:  Please see the history of present illness.   Denies neurological complaints. No edema. No bleeding on Coumadin.   All other systems reviewed and negative.   OBJECTIVE: VS:  BP 110/68  Pulse 52  Ht 5\' 10"  (1.778 m)  Wt 168 lb (76.204 kg)  BMI 24.11 kg/m2 Well nourished, well developed, in no acute distress,  healthy-appearing. HEENT: normal Neck: JVD flat. Carotid bruit absent  Cardiac:  normal S1, S2; RRR; no murmur Lungs:  clear to auscultation bilaterally, no wheezing, rhonchi or rales Abd: soft, nontender, no hepatomegaly Ext: Edema normal. Pulses 2+ Skin: warm and dry Neuro:  CNs 2-12 intact, no focal abnormalities noted  EKG:  Normal sinus rhythm with poor R-wave progression, left atrial abnormality.       Signed, Darci Needle III, MD 09/28/2013 5:16 PM

## 2013-11-15 ENCOUNTER — Ambulatory Visit (INDEPENDENT_AMBULATORY_CARE_PROVIDER_SITE_OTHER): Payer: Medicare Other | Admitting: Pharmacist

## 2013-11-15 DIAGNOSIS — I4891 Unspecified atrial fibrillation: Secondary | ICD-10-CM | POA: Diagnosis not present

## 2013-11-15 LAB — POCT INR: INR: 1.7

## 2013-12-06 ENCOUNTER — Other Ambulatory Visit: Payer: Self-pay | Admitting: Interventional Cardiology

## 2013-12-06 ENCOUNTER — Ambulatory Visit (INDEPENDENT_AMBULATORY_CARE_PROVIDER_SITE_OTHER): Payer: Medicare Other | Admitting: Pharmacist

## 2013-12-06 DIAGNOSIS — I4891 Unspecified atrial fibrillation: Secondary | ICD-10-CM | POA: Diagnosis not present

## 2013-12-06 DIAGNOSIS — Z5181 Encounter for therapeutic drug level monitoring: Secondary | ICD-10-CM | POA: Insufficient documentation

## 2013-12-06 LAB — POCT INR: INR: 3.6

## 2013-12-09 ENCOUNTER — Telehealth: Payer: Self-pay | Admitting: Pharmacist

## 2013-12-09 NOTE — Telephone Encounter (Signed)
Patient called and states he's ready to change to Eliquis.  He and Dr. Tamala Julian have discussed this in the past, and he was waiting until his insurance was straightened out for the new year.  Patient will be able to start once INR is < 2.0.  He is going to see me on 12/14/13 for protime.  I've advised him to stop warfarin after his 12/12/13 dose.  Will get needed blood work at that time.  To Dr.Smith as Juluis Rainier.

## 2013-12-10 NOTE — Telephone Encounter (Signed)
Thanks and agree 

## 2013-12-14 ENCOUNTER — Telehealth: Payer: Self-pay | Admitting: Pharmacist

## 2013-12-14 ENCOUNTER — Ambulatory Visit (INDEPENDENT_AMBULATORY_CARE_PROVIDER_SITE_OTHER): Payer: Medicare Other | Admitting: Pharmacist

## 2013-12-14 DIAGNOSIS — Z5181 Encounter for therapeutic drug level monitoring: Secondary | ICD-10-CM | POA: Diagnosis not present

## 2013-12-14 DIAGNOSIS — I4891 Unspecified atrial fibrillation: Secondary | ICD-10-CM | POA: Diagnosis not present

## 2013-12-14 LAB — BASIC METABOLIC PANEL
BUN: 17 mg/dL (ref 6–23)
CHLORIDE: 109 meq/L (ref 96–112)
CO2: 25 mEq/L (ref 19–32)
CREATININE: 1.1 mg/dL (ref 0.4–1.5)
Calcium: 9.5 mg/dL (ref 8.4–10.5)
GFR: 73.71 mL/min (ref >60.00)
GLUCOSE: 82 mg/dL (ref 70–99)
POTASSIUM: 3.9 meq/L (ref 3.5–5.1)
Sodium: 140 mEq/L (ref 135–145)

## 2013-12-14 LAB — CBC
HEMATOCRIT: 45 % (ref 39.0–52.0)
HEMOGLOBIN: 15.4 g/dL (ref 13.0–17.0)
MCHC: 34.3 g/dL (ref 30.0–36.0)
MCV: 92.9 fl (ref 78.0–100.0)
Platelets: 179 10*3/uL (ref 150.0–400.0)
RBC: 4.85 Mil/uL (ref 4.22–5.81)
RDW: 12.5 % (ref 11.5–14.6)
WBC: 5.9 10*3/uL (ref 4.5–10.5)

## 2013-12-14 LAB — POCT INR: INR: 1.5

## 2013-12-14 MED ORDER — APIXABAN 5 MG PO TABS: 5.0000 mg | ORAL_TABLET | Freq: Two times a day (BID) | ORAL | Status: DC

## 2013-12-14 MED ORDER — ASPIRIN 81 MG PO TABS: ORAL_TABLET | ORAL | Status: DC

## 2013-12-14 NOTE — Telephone Encounter (Signed)
Message copied by Bishop Limbo on Tue Dec 14, 2013  3:24 PM ------      Message from: Daneen Schick      Created: Tue Dec 14, 2013  2:36 PM      Regarding: RE: Felipe Paluch Eliquis +/- aspirin       Aspirin M,W F      ----- Message -----         From: Maralyn Sago Darrin Koman, RPH         Sent: 12/14/2013  11:08 AM           To: Sinclair Grooms, MD      Subject: Robyne Askew Eliquis +/- aspirin                           Dr. Tamala Julian,       We transitioned patient from warfarin to Eliquis today.  He has a h/o CABG.  Do you want to continue aspirin 81 mg qd with Eliquis, or d/c aspirin.   Please advise.  Thanks.       ------

## 2013-12-14 NOTE — Patient Instructions (Signed)
A full discussion of the nature of anticoagulants has been carried out.  A benefit/risk analysis has been presented to the patient, so that they understand the justification for choosing anticoagulation with Eliquis at this time.  The need for compliance is stressed.  Pt is aware to take the medication twice daily.  Side effects of potential bleeding are discussed, including unusual colored urine or stools, coughing up blood or coffee ground emesis, nose bleeds or serious fall or head trauma.  Discussed signs and symptoms of stroke. The patient should avoid any OTC items containing aspirin or ibuprofen.  Avoid alcohol consumption.   Call if any signs of abnormal bleeding.  Discussed financial obligations and resolved any difficulty in obtaining medication.  Next lab test test in 4 weeks.  Take Eliquis 5 mg twice daily.  Take with a meal.  Checking kidney and hemoglobin today, and will repeat in 4 weeks.

## 2013-12-14 NOTE — Telephone Encounter (Signed)
Message copied by Bishop Limbo on Tue Dec 14, 2013  3:23 PM ------      Message from: Daneen Schick      Created: Tue Dec 14, 2013  2:36 PM      Regarding: RE: Braddock Servellon Eliquis +/- aspirin       Aspirin M,W F      ----- Message -----         From: Maralyn Sago Lucy Boardman, RPH         Sent: 12/14/2013  11:08 AM           To: Sinclair Grooms, MD      Subject: Robyne Askew Eliquis +/- aspirin                           Dr. Tamala Julian,       We transitioned patient from warfarin to Eliquis today.  He has a h/o CABG.  Do you want to continue aspirin 81 mg qd with Eliquis, or d/c aspirin.   Please advise.  Thanks.       ------

## 2013-12-14 NOTE — Telephone Encounter (Signed)
Patient notified to reduce aspirin 81 mg to every Monday, Wednesday, and Friday since starting Eliquis 5 mg bid.

## 2013-12-14 NOTE — Progress Notes (Signed)
Pt starting on Eliquis 5 mg today  for Afib on 12/14/13.  Reviewed patients medication list.  Pt is not currently on any combined P-gp and strong CYP3A4 inhibitors/inducers (ketoconazole, traconazole, ritonavir, carbamazepine, phenytoin, rifampin, St. John's wort).  Reviewed labs.   SCr 1.1, Weight 164 lbs (74 kg), CrCl- 70 ml/min  Dose appropriatebased on CrCl, age, and weight.   Hgb and HCT appropriate based on today's CBC as well (hemoglobin 15.4)  Changing to Eliquis today.  Checked with insurance company already.  INR is < 2.0 today, so will start Eliquis 5 mg tonight.  Weight is 164 lbs, Scr was 1.09 last year, hemoglobin was normal, and age is 63 y.o.  Checked BMET and CBC today and they were both normal.  Patient notified by phone. Will recheck again in 4 weeks.  Checked with Dr. Tamala Julian today regarding continuing or stopping his aspirin 81 mg qd, and Dr. Tamala Julian wants him to change to aspirin 81 mg every Monday, Wednesday, and Friday.  Appointment made for 4 weeks from now.  Won't need further follow up after this unless he has trouble with medication.  Of note, he failed Pradaxa in the past due to GI side effects.  A full discussion of the nature of anticoagulants has been carried out.  A benefit/risk analysis has been presented to the patient, so that they understand the justification for choosing anticoagulation with Eliquis at this time.  The need for compliance is stressed.  Pt is aware to take the medication twice daily.  Side effects of potential bleeding are discussed, including unusual colored urine or stools, coughing up blood or coffee ground emesis, nose bleeds or serious fall or head trauma.  Discussed signs and symptoms of stroke. The patient should avoid any OTC items containing aspirin or ibuprofen.  Avoid alcohol consumption.   Call if any signs of abnormal bleeding.  Discussed financial obligations and resolved any difficulty in obtaining medication.  Next lab needed in 4 weeks at 4 week  f/u visit.Marland Kitchen

## 2013-12-20 ENCOUNTER — Telehealth: Payer: Self-pay

## 2013-12-20 NOTE — Telephone Encounter (Signed)
Message copied by Lamar Laundry on Mon Dec 20, 2013  2:28 PM ------      Message from: Daneen Schick      Created: Tue Dec 14, 2013  2:49 PM       Normal labs.      Once on Eliquis, decrease aspirin to 81 mg M, W, F. ------

## 2013-12-21 NOTE — Telephone Encounter (Signed)
New message ° ° ° ° °Returning Isaac [name]'s call °

## 2013-12-21 NOTE — Telephone Encounter (Signed)
pt wife given lab results.and Dr.Smith instructions.Okay to reduce atorvastatin to 10 mg daily and repeat Lipid and Alt 1 year.pt wife verbalized understanding. 

## 2013-12-21 NOTE — Telephone Encounter (Signed)
Please disregard prior tel note pt

## 2014-01-10 ENCOUNTER — Ambulatory Visit (INDEPENDENT_AMBULATORY_CARE_PROVIDER_SITE_OTHER): Payer: Medicare Other | Admitting: Pharmacist

## 2014-01-10 VITALS — Wt 164.0 lb

## 2014-01-10 DIAGNOSIS — I4891 Unspecified atrial fibrillation: Secondary | ICD-10-CM

## 2014-01-10 DIAGNOSIS — Z79899 Other long term (current) drug therapy: Secondary | ICD-10-CM

## 2014-01-10 NOTE — Progress Notes (Signed)
Pt was started on Eliquis 5 mg bid for Afib on 12/14/13. Tolerating this well.  Reviewed patients medication list.  Pt is not currently on any combined P-gp and strong CYP3A4 inhibitors/inducers (ketoconazole, traconazole, ritonavir, carbamazepine, phenytoin, rifampin, St. John's wort).  Reviewed labs.  SCr 1.1 mg/dL, Weight 164 lbs, CrCl- 71 ml/min.  Dose appropriate based on CrCl.   Hgb and HCT Within Normal Limits  Patient tolerating Eliquis currently.  He failed Pradaxa in the past due to GI / diarrhea.  Cost is not a problem for patient (#90 day supply is $207).  He is getting a repeat BMET and CBC today.  He denies any signs of bleeding or bruising.  He asked about medication to take for constipation, and we discussed options.  He will start colace today and he sees PCP in a few weeks.  If no improvement on colace, he will have Dr. Inda Merlin change it to something different.  Will need to get blood work in six months assuming today's blood work is normal.  BMET and CBC normal - patient notified.  A full discussion of the nature of anticoagulants has been carried out.  A benefit/risk analysis has been presented to the patient, so that they understand the justification for choosing anticoagulation with Eliquis at this time.  The need for compliance is stressed.  Pt is aware to take the medication twice daily.  Side effects of potential bleeding are discussed, including unusual colored urine or stools, coughing up blood or coffee ground emesis, nose bleeds or serious fall or head trauma.  Discussed signs and symptoms of stroke. The patient should avoid any OTC items containing aspirin or ibuprofen.  Avoid alcohol consumption.   Call if any signs of abnormal bleeding.  Discussed financial obligations and resolved any difficulty in obtaining medication.  Next lab test test in 6 months (07/12/14 for BMET and CBC)

## 2014-01-10 NOTE — Patient Instructions (Signed)
Continue Eliquis 5 mg twice daily and aspirin 81 mg every Monday, Wednesday, and Friday since tolerating regimen well.  Will recheck blood work today.  Continue to avoid Aleve, Advil, Ibuprofen for pain. Tylenol okay for pain.  Okay to eat green leafy vegetables.  Will need to recheck blood work for kidneys and hemoglobin every 6 months.  Will set that up after today's results are seen.  Okay to use Colace (docusate) 1-2 per day for constipation issues.

## 2014-01-11 LAB — CBC
HCT: 45.1 % (ref 39.0–52.0)
Hemoglobin: 15.1 g/dL (ref 13.0–17.0)
MCHC: 33.5 g/dL (ref 30.0–36.0)
MCV: 93.9 fl (ref 78.0–100.0)
PLATELETS: 186 10*3/uL (ref 150.0–400.0)
RBC: 4.81 Mil/uL (ref 4.22–5.81)
RDW: 12.2 % (ref 11.5–14.6)
WBC: 6.2 10*3/uL (ref 4.5–10.5)

## 2014-01-11 LAB — BASIC METABOLIC PANEL
BUN: 14 mg/dL (ref 6–23)
CALCIUM: 9.4 mg/dL (ref 8.4–10.5)
CO2: 25 mEq/L (ref 19–32)
CREATININE: 1.1 mg/dL (ref 0.4–1.5)
Chloride: 110 mEq/L (ref 96–112)
GFR: 73.69 mL/min (ref >60.00)
Glucose, Bld: 89 mg/dL (ref 70–99)
Potassium: 3.8 mEq/L (ref 3.5–5.1)
SODIUM: 140 meq/L (ref 135–145)

## 2014-02-15 DIAGNOSIS — N529 Male erectile dysfunction, unspecified: Secondary | ICD-10-CM | POA: Diagnosis not present

## 2014-02-15 DIAGNOSIS — E559 Vitamin D deficiency, unspecified: Secondary | ICD-10-CM | POA: Diagnosis not present

## 2014-02-15 DIAGNOSIS — K5900 Constipation, unspecified: Secondary | ICD-10-CM | POA: Diagnosis not present

## 2014-02-15 DIAGNOSIS — Z79899 Other long term (current) drug therapy: Secondary | ICD-10-CM | POA: Diagnosis not present

## 2014-02-15 DIAGNOSIS — Z0000 Encounter for general adult medical examination without abnormal findings: Secondary | ICD-10-CM | POA: Diagnosis not present

## 2014-02-15 DIAGNOSIS — Z125 Encounter for screening for malignant neoplasm of prostate: Secondary | ICD-10-CM | POA: Diagnosis not present

## 2014-02-15 DIAGNOSIS — E785 Hyperlipidemia, unspecified: Secondary | ICD-10-CM | POA: Diagnosis not present

## 2014-02-15 DIAGNOSIS — I2510 Atherosclerotic heart disease of native coronary artery without angina pectoris: Secondary | ICD-10-CM | POA: Diagnosis not present

## 2014-02-15 DIAGNOSIS — I4891 Unspecified atrial fibrillation: Secondary | ICD-10-CM | POA: Diagnosis not present

## 2014-02-15 DIAGNOSIS — L989 Disorder of the skin and subcutaneous tissue, unspecified: Secondary | ICD-10-CM | POA: Diagnosis not present

## 2014-04-29 DIAGNOSIS — H1044 Vernal conjunctivitis: Secondary | ICD-10-CM | POA: Diagnosis not present

## 2014-06-20 ENCOUNTER — Other Ambulatory Visit: Payer: Self-pay | Admitting: Interventional Cardiology

## 2014-06-20 NOTE — Telephone Encounter (Signed)
Is the patient on crestor or atorvastatin? The atorvastatin is mentioned on the 12/20/13 note, so I was not sure. Please advise. Thanks, MI

## 2014-07-12 ENCOUNTER — Other Ambulatory Visit: Payer: Self-pay | Admitting: Dermatology

## 2014-07-12 ENCOUNTER — Other Ambulatory Visit: Payer: PRIVATE HEALTH INSURANCE

## 2014-07-12 DIAGNOSIS — C44519 Basal cell carcinoma of skin of other part of trunk: Secondary | ICD-10-CM | POA: Diagnosis not present

## 2014-07-12 DIAGNOSIS — D485 Neoplasm of uncertain behavior of skin: Secondary | ICD-10-CM | POA: Diagnosis not present

## 2014-07-12 DIAGNOSIS — L821 Other seborrheic keratosis: Secondary | ICD-10-CM | POA: Diagnosis not present

## 2014-07-12 DIAGNOSIS — C44611 Basal cell carcinoma of skin of unspecified upper limb, including shoulder: Secondary | ICD-10-CM | POA: Diagnosis not present

## 2014-07-14 ENCOUNTER — Other Ambulatory Visit (INDEPENDENT_AMBULATORY_CARE_PROVIDER_SITE_OTHER): Payer: Medicare Other

## 2014-07-14 DIAGNOSIS — Z79899 Other long term (current) drug therapy: Secondary | ICD-10-CM

## 2014-07-14 DIAGNOSIS — I4891 Unspecified atrial fibrillation: Secondary | ICD-10-CM | POA: Diagnosis not present

## 2014-07-14 LAB — BASIC METABOLIC PANEL
BUN: 17 mg/dL (ref 6–23)
CHLORIDE: 109 meq/L (ref 96–112)
CO2: 24 meq/L (ref 19–32)
Calcium: 9.3 mg/dL (ref 8.4–10.5)
Creatinine, Ser: 1.3 mg/dL (ref 0.4–1.5)
GFR: 60.38 mL/min (ref >60.00)
GLUCOSE: 92 mg/dL (ref 70–99)
POTASSIUM: 3.6 meq/L (ref 3.5–5.1)
SODIUM: 140 meq/L (ref 135–145)

## 2014-07-14 LAB — CBC
HEMATOCRIT: 44.2 % (ref 39.0–52.0)
HEMOGLOBIN: 14.8 g/dL (ref 13.0–17.0)
MCHC: 33.4 g/dL (ref 30.0–36.0)
MCV: 91.9 fl (ref 78.0–100.0)
PLATELETS: 196 10*3/uL (ref 150.0–400.0)
RBC: 4.81 Mil/uL (ref 4.22–5.81)
RDW: 12.4 % (ref 11.5–15.5)
WBC: 6.8 10*3/uL (ref 4.0–10.5)

## 2014-07-15 ENCOUNTER — Telehealth: Payer: Self-pay

## 2014-07-15 NOTE — Telephone Encounter (Signed)
Message copied by Lamar Laundry on Fri Jul 15, 2014  3:00 PM ------      Message from: Daneen Schick      Created: Fri Jul 15, 2014  2:27 PM       labs are perfect! ------

## 2014-07-15 NOTE — Telephone Encounter (Signed)
called to give pt lab results.lmtcb

## 2014-07-29 ENCOUNTER — Telehealth: Payer: Self-pay | Admitting: Interventional Cardiology

## 2014-07-29 NOTE — Telephone Encounter (Signed)
Message copied by Lamar Laundry on Fri Jul 29, 2014  9:57 AM ------      Message from: Daneen Schick      Created: Fri Jul 15, 2014  2:27 PM       labs are perfect! ------

## 2014-07-29 NOTE — Telephone Encounter (Signed)
New message ° ° ° ° °Want lab results °

## 2014-07-29 NOTE — Telephone Encounter (Signed)
2nd attempt.lmom.  labs are perfect!

## 2014-09-05 DIAGNOSIS — Z85828 Personal history of other malignant neoplasm of skin: Secondary | ICD-10-CM | POA: Diagnosis not present

## 2014-09-05 DIAGNOSIS — Z08 Encounter for follow-up examination after completed treatment for malignant neoplasm: Secondary | ICD-10-CM | POA: Diagnosis not present

## 2014-09-08 DIAGNOSIS — R630 Anorexia: Secondary | ICD-10-CM | POA: Diagnosis not present

## 2014-09-08 DIAGNOSIS — R194 Change in bowel habit: Secondary | ICD-10-CM | POA: Diagnosis not present

## 2014-09-19 ENCOUNTER — Telehealth: Payer: Self-pay

## 2014-09-19 NOTE — Telephone Encounter (Signed)
Cardiac clearance placed in MR fax box to fax to [facility] attn: Dr.Buccini fax [phone]

## 2014-09-23 ENCOUNTER — Encounter: Payer: Self-pay | Admitting: Interventional Cardiology

## 2014-10-19 ENCOUNTER — Ambulatory Visit: Payer: PRIVATE HEALTH INSURANCE | Admitting: Interventional Cardiology

## 2014-10-21 ENCOUNTER — Other Ambulatory Visit: Payer: Self-pay | Admitting: Gastroenterology

## 2014-10-21 DIAGNOSIS — R194 Change in bowel habit: Secondary | ICD-10-CM | POA: Diagnosis not present

## 2014-10-21 DIAGNOSIS — D120 Benign neoplasm of cecum: Secondary | ICD-10-CM | POA: Diagnosis not present

## 2014-10-21 DIAGNOSIS — K5900 Constipation, unspecified: Secondary | ICD-10-CM | POA: Diagnosis not present

## 2014-10-21 DIAGNOSIS — R1013 Epigastric pain: Secondary | ICD-10-CM | POA: Diagnosis not present

## 2014-11-21 ENCOUNTER — Ambulatory Visit (INDEPENDENT_AMBULATORY_CARE_PROVIDER_SITE_OTHER): Payer: Medicare Other | Admitting: Interventional Cardiology

## 2014-11-21 VITALS — BP 124/82 | HR 56 | Ht 70.0 in | Wt 172.0 lb

## 2014-11-21 DIAGNOSIS — I5042 Chronic combined systolic (congestive) and diastolic (congestive) heart failure: Secondary | ICD-10-CM | POA: Diagnosis not present

## 2014-11-21 DIAGNOSIS — I2510 Atherosclerotic heart disease of native coronary artery without angina pectoris: Secondary | ICD-10-CM

## 2014-11-21 NOTE — Patient Instructions (Signed)
Your physician recommends that you continue on your current medications as directed. Please refer to the Current Medication list given to you today.  Your physician wants you to follow-up in: 1 year with Dr.Smith You will receive a reminder letter in the mail two months in advance. If you don't receive a letter, please call our office to schedule the follow-up appointment.  

## 2014-11-21 NOTE — Progress Notes (Signed)
Patient ID: KIVEN VANGILDER, male   DOB: 09-03-1951, 64 y.o.   MRN: [id number]    [address], [address] [place], [place]  [address] Phone: [phone] Fax:  [phone]  Date:  11/21/2014   ID:  Isaac [name], DOB 06/10/1951, MRN [id number]  PCP:  Henrine Screws, MD   ASSESSMENT:  1. Paroxysmal atrial fibrillation, stable 2. Chronic anticoagulation therapy with apixaban 3. Coronary artery disease, asymptomatic  PLAN:  1. No change in current medical regimen 2. Maintain an active lifestyle 3. Clinical follow-up in one year   SUBJECTIVE: Isaac [name] is a 64 y.o. male who is doing well. He has rare episodes of atrial fib. No episodes of last along with than a few hours. A. fib occurs about once a month. He has very scattered episodes of chest pain. No chest discomfort similar to angina. He denies orthopnea and PND. No medication side effects.   Wt Readings from Last 3 Encounters:  11/21/14 172 lb (78.019 kg)  01/10/14 164 lb (74.39 kg)  09/28/13 168 lb (76.204 kg)     Past Medical History  Diagnosis Date  . Myocardial infarction   . Coronary artery disease   . Anginal pain   . Dysrhythmia   . CHF (congestive heart failure)   . Chest pain, unspecified   . S/P CABG (coronary artery bypass graft)   . METATARSALGIA   . CERVICAL SPINE DISORDER, NOS   . 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 OSTEOARTHROSIS, LOCAL, SCND, HAND     Current Outpatient Prescriptions  Medication Sig Dispense Refill  . apixaban (ELIQUIS) 5 MG TABS tablet Take 1 tablet (5 mg total) by mouth 2 (two) times daily. 180 tablet 3  . aspirin 81 MG tablet Take aspirin 81 mg every Monday, Wednesday, and Friday (Patient taking differently: Take 81 mg by mouth daily. Take aspirin 81 mg every Monday, Wednesday, and Friday) 30 tablet   . metoprolol tartrate (LOPRESSOR) 25 MG tablet TAKE 1/2 TABLET BY MOUTH TWICE A DAY 90 tablet 3  . rosuvastatin (CRESTOR) 5 MG tablet Take 5 mg by mouth daily.      . Tamsulosin HCl (FLOMAX) 0.4 MG CAPS Take 0.4 mg by mouth daily after supper.    . nitroGLYCERIN (NITROSTAT) 0.4 MG SL tablet Place 1 tablet (0.4 mg total) under the tongue every 5 (five) minutes x 3 doses as needed for chest pain. 25 tablet 3   No current facility-administered medications for this visit.    Allergies:   No Known Allergies  Social History:  The patient  reports that he has never smoked. He has never used smokeless tobacco. He reports that he drinks alcohol. He reports that he does not use illicit drugs.   ROS:  Please see the history of present illness.   No blood in urine or stool. No transient neurological complaints.   All other systems reviewed and negative.   OBJECTIVE: VS:  BP 124/82 mmHg  Pulse 56  Ht 5\' 10"  (1.778 m)  Wt 172 lb (78.019 kg)  BMI 24.68 kg/m2 Well nourished, well developed, in no acute distress, appears healthy HEENT: normal Neck: JVD flat. Carotid bruit absent  Cardiac:  normal S1, S2; RRR; no murmur Lungs:  clear to auscultation bilaterally, no wheezing, rhonchi or rales Abd: soft, nontender, no hepatomegaly Ext: Edema none. Pulses 2+ Skin: warm and dry Neuro:  CNs 2-12 intact, no focal abnormalities noted  EKG:  Sinus bradycardia, left atrial abnormality, poor  R-wave progression V1 through V3.       Signed, Illene Labrador III, MD 11/21/2014 4:41 PM

## 2014-12-05 DIAGNOSIS — Z08 Encounter for follow-up examination after completed treatment for malignant neoplasm: Secondary | ICD-10-CM | POA: Diagnosis not present

## 2014-12-05 DIAGNOSIS — D225 Melanocytic nevi of trunk: Secondary | ICD-10-CM | POA: Diagnosis not present

## 2014-12-05 DIAGNOSIS — Z85828 Personal history of other malignant neoplasm of skin: Secondary | ICD-10-CM | POA: Diagnosis not present

## 2014-12-05 DIAGNOSIS — L814 Other melanin hyperpigmentation: Secondary | ICD-10-CM | POA: Diagnosis not present

## 2014-12-06 ENCOUNTER — Other Ambulatory Visit: Payer: Self-pay | Admitting: Interventional Cardiology

## 2014-12-12 ENCOUNTER — Other Ambulatory Visit: Payer: Self-pay | Admitting: Interventional Cardiology

## 2014-12-13 ENCOUNTER — Other Ambulatory Visit: Payer: Self-pay

## 2014-12-13 MED ORDER — ROSUVASTATIN CALCIUM 5 MG PO TABS: 5.0000 mg | ORAL_TABLET | Freq: Every day | ORAL | Status: DC

## 2014-12-13 NOTE — Telephone Encounter (Signed)
5 mg 

## 2014-12-14 DIAGNOSIS — R1013 Epigastric pain: Secondary | ICD-10-CM | POA: Diagnosis not present

## 2014-12-14 DIAGNOSIS — Z86010 Personal history of colonic polyps: Secondary | ICD-10-CM | POA: Diagnosis not present

## 2014-12-14 DIAGNOSIS — K5900 Constipation, unspecified: Secondary | ICD-10-CM | POA: Diagnosis not present

## 2014-12-20 ENCOUNTER — Other Ambulatory Visit: Payer: Self-pay | Admitting: Interventional Cardiology

## 2015-02-14 ENCOUNTER — Other Ambulatory Visit: Payer: Self-pay

## 2015-02-14 MED ORDER — ROSUVASTATIN CALCIUM 5 MG PO TABS: 5.0000 mg | ORAL_TABLET | Freq: Every day | ORAL | Status: DC

## 2015-02-20 DIAGNOSIS — F329 Major depressive disorder, single episode, unspecified: Secondary | ICD-10-CM | POA: Diagnosis not present

## 2015-02-20 DIAGNOSIS — E039 Hypothyroidism, unspecified: Secondary | ICD-10-CM | POA: Diagnosis not present

## 2015-02-20 DIAGNOSIS — I4891 Unspecified atrial fibrillation: Secondary | ICD-10-CM | POA: Diagnosis not present

## 2015-02-20 DIAGNOSIS — Z0001 Encounter for general adult medical examination with abnormal findings: Secondary | ICD-10-CM | POA: Diagnosis not present

## 2015-02-20 DIAGNOSIS — Z7901 Long term (current) use of anticoagulants: Secondary | ICD-10-CM | POA: Diagnosis not present

## 2015-02-20 DIAGNOSIS — I2510 Atherosclerotic heart disease of native coronary artery without angina pectoris: Secondary | ICD-10-CM | POA: Diagnosis not present

## 2015-02-20 DIAGNOSIS — N3943 Post-void dribbling: Secondary | ICD-10-CM | POA: Diagnosis not present

## 2015-02-20 DIAGNOSIS — Z79899 Other long term (current) drug therapy: Secondary | ICD-10-CM | POA: Diagnosis not present

## 2015-02-21 DIAGNOSIS — Z125 Encounter for screening for malignant neoplasm of prostate: Secondary | ICD-10-CM | POA: Diagnosis not present

## 2015-02-21 DIAGNOSIS — I5032 Chronic diastolic (congestive) heart failure: Secondary | ICD-10-CM | POA: Diagnosis not present

## 2015-02-21 DIAGNOSIS — I4891 Unspecified atrial fibrillation: Secondary | ICD-10-CM | POA: Diagnosis not present

## 2015-02-21 DIAGNOSIS — Z0000 Encounter for general adult medical examination without abnormal findings: Secondary | ICD-10-CM | POA: Diagnosis not present

## 2015-02-21 DIAGNOSIS — I2510 Atherosclerotic heart disease of native coronary artery without angina pectoris: Secondary | ICD-10-CM | POA: Diagnosis not present

## 2015-02-21 DIAGNOSIS — Z79899 Other long term (current) drug therapy: Secondary | ICD-10-CM | POA: Diagnosis not present

## 2015-02-21 DIAGNOSIS — E559 Vitamin D deficiency, unspecified: Secondary | ICD-10-CM | POA: Diagnosis not present

## 2015-02-21 DIAGNOSIS — E039 Hypothyroidism, unspecified: Secondary | ICD-10-CM | POA: Diagnosis not present

## 2015-04-14 DIAGNOSIS — J209 Acute bronchitis, unspecified: Secondary | ICD-10-CM | POA: Diagnosis not present

## 2015-06-08 DIAGNOSIS — H43813 Vitreous degeneration, bilateral: Secondary | ICD-10-CM | POA: Diagnosis not present

## 2015-08-15 ENCOUNTER — Other Ambulatory Visit: Payer: Self-pay | Admitting: Interventional Cardiology

## 2015-08-15 NOTE — Telephone Encounter (Signed)
Please advise on refill as I do not see a recent lipid panel.

## 2015-08-22 ENCOUNTER — Telehealth: Payer: Self-pay | Admitting: Interventional Cardiology

## 2015-08-22 NOTE — Telephone Encounter (Signed)
New message     Talk to the nurse.  Pt has been off and on in afib for the last couple of days.  He thinks he may need to be seen but not sure.  Please advise

## 2015-08-22 NOTE — Telephone Encounter (Signed)
Returned pt call. Pt sts that he has had an  increase in episodes of Afib. Pt sts that the last 2 episodes lasted several hours. This is different from previous episodes which are usually shorter in duration Pt sts that he is SOB when in Afib. Pt is currently asymptomatic. Adv pt that Dr.Smith would not be in the office for the next 2 weeks. Asked pt if he would like to be seen in the Afib clinic. Pt is agreeable with plan. Adv pt that the EP scheduler will call him to schedule. Pt verbalized understanding.

## 2015-08-23 ENCOUNTER — Encounter (HOSPITAL_COMMUNITY): Payer: Self-pay | Admitting: Nurse Practitioner

## 2015-08-23 ENCOUNTER — Ambulatory Visit (HOSPITAL_COMMUNITY)
Admission: RE | Admit: 2015-08-23 | Discharge: 2015-08-23 | Disposition: A | Discharge status: Home / Self Care | Payer: Medicare Other | Source: Ambulatory Visit | Attending: Nurse Practitioner | Admitting: Nurse Practitioner

## 2015-08-23 VITALS — BP 110/68 | HR 60 | Ht 70.0 in | Wt 163.4 lb

## 2015-08-23 DIAGNOSIS — I480 Paroxysmal atrial fibrillation: Secondary | ICD-10-CM | POA: Diagnosis not present

## 2015-08-23 DIAGNOSIS — I2510 Atherosclerotic heart disease of native coronary artery without angina pectoris: Secondary | ICD-10-CM | POA: Diagnosis not present

## 2015-08-23 NOTE — Patient Instructions (Signed)
With afib - may take extra 1/2 tablet as needed as long as BP >100.

## 2015-08-24 ENCOUNTER — Encounter (HOSPITAL_COMMUNITY): Payer: Self-pay | Admitting: Nurse Practitioner

## 2015-08-24 NOTE — Telephone Encounter (Signed)
Addressed by Roderic Palau on 10/12

## 2015-08-24 NOTE — Progress Notes (Signed)
Patient ID: Isaac [name], male   DOB: 11/19/1950, 64 y.o.   MRN: [id number]     Primary Care Physician: Isaac Screws, MD Referring Physician: Dr. Ok [name] Isaac [name] is a 64 y.o. male with a h/o PAF, CAD, s/p MI, CABG, in 2010, that is in the afib clinic today for evaluation of afib episode that lasted 24 hours occuring on Monday-Tuesday of this week.Isaac [name] He also reports one day of afib in early September. He states that  he has two type of episodes, one that he will feel a delay after a regular 6-8 regular betas and he is only aware of this if he is feeling his pulse. The other episode is that of an irregular irregular rhythm that makes him feel lightheaded at times and in fact on Monday after standing up he was so lightheaded that he stumbled to the floor, but did not pass out. No injury. He is currently on eliquis and asa, no bleeding issues for a chadsvasc score of at least 3.  He reports that he was on amiodarone around time of bypass but took only for a few months and it was stopped. Episodes of afib  have ben infrequent and not bothersome enough to take an antiarrythmic. He does not drink regular alcohol, although one episode may have been triggered by drinking a beer,no tobacco use, no snoring history. He does exercise regularly and is not overweight. He has never tried to increase BB in the past to shorten episodes of afib. Last echo in 2010 with ef of around 35-40%, following his MI. With LV dysfunction and CAD, the antiarrythmic's multaq and flecainide would not be options for him. Will repeat echo to see if there has been improvement in EF or other structural abnormalities that may have be associated with  afib.   Today, he denies symptoms of palpitations, chest pain, shortness of breath, orthopnea, PND, lower extremity edema, dizziness, presyncope, syncope, or neurologic sequela. The patient is tolerating medications without difficulties and is otherwise without complaint today.    Past Medical History  Diagnosis Date  . Myocardial infarction ([place])   . Coronary artery disease   . Anginal pain ([place])   . Dysrhythmia   . CHF (congestive heart failure) ([place])   . Chest pain, unspecified   . S/P CABG (coronary artery bypass graft)   . METATARSALGIA   . CERVICAL SPINE DISORDER, NOS   . 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 OSTEOARTHROSIS, LOCAL, SCND, HAND    Past Surgical History  Procedure Laterality Date  . Cholecystectomy    . Appendectomy    . Coronary artery bypass graft  10/2009    Current Outpatient Prescriptions  Medication Sig Dispense Refill  . aspirin 81 MG tablet Take aspirin 81 mg every Monday, Wednesday, and Friday (Patient taking differently: Take 81 mg by mouth daily. Take aspirin 81 mg every Monday, Wednesday, and Friday) 30 tablet   . CRESTOR 5 MG tablet TAKE 1 TABLET BY MOUTH DAILY. 90 tablet 0  . ELIQUIS 5 MG TABS tablet TAKE 1 TABLET BY MOUTH 2 (TWO) TIMES DAILY. 180 tablet 3  . metoprolol tartrate (LOPRESSOR) 25 MG tablet TAKE 1/2 TABLET BY MOUTH TWICE DAILY 90 tablet 3  . Tamsulosin HCl (FLOMAX) 0.4 MG CAPS Take 0.4 mg by mouth daily after supper.    . nitroGLYCERIN (NITROSTAT) 0.4 MG SL tablet Place 1 tablet (0.4 mg total) under the tongue every 5 (five) minutes x 3 doses as needed  for chest pain. 25 tablet 3   No current facility-administered medications for this encounter.    No Known Allergies  Social History   Social History  . Marital Status: Married    Spouse Name: N/A  . Number of Children: N/A  . Years of Education: N/A   Occupational History  . Not on file.   Social History Main Topics  . Smoking status: Never Smoker   . Smokeless tobacco: Never Used  . Alcohol Use: Yes     Comment: 3  . Drug Use: No  . Sexual Activity: Not Currently   Other Topics Concern  . Not on file   Social History Narrative    No family history on file.  ROS- All systems are reviewed and negative except as per the HPI  above  Physical Exam: Filed Vitals:   08/23/15 1447  BP: 110/68  Pulse: 60  Height: 5\' 10"  (1.778 m)  Weight: 163 lb 6.4 oz (74.118 kg)    GEN- The patient is well appearing, alert and oriented x 3 today.   Head- normocephalic, atraumatic Eyes-  Sclera clear, conjunctiva pink Ears- hearing intact Oropharynx- clear Neck- supple, no JVP Lymph- no cervical lymphadenopathy Lungs- Clear to ausculation bilaterally, normal work of breathing Heart- Regular rate and rhythm, no murmurs, rubs or gallops, PMI not laterally displaced GI- soft, NT, ND, + BS Extremities- no clubbing, cyanosis, or edema MS- no significant deformity or atrophy Skin- no rash or lesion Psych- euthymic mood, full affect Neuro- strength and sensation are intact  EKG- NSR, rate 87 bpm, pr int 166 ms, qrs int 90 bpm, qtc 425 ms   Epic records reviewed  Echo- from 2010- - Left ventricle: Wall thickness was increased in a pattern of mild  LVH. There was mild concentric hypertrophy. Systolic function was  moderately reduced. The estimated ejection fraction was in the  range of 35% to 40%. There is akinesis of the mid-distal  anteroseptal, anterior, and apical myocardium; consistent with  infarction. The study is not technically sufficient to allow  evaluation of LV diastolic function. - Ventricular septum: Septal motion showed paradox. These changes  are consistent with a post-thoracotomy state. - Aortic valve: Moderate diffuse thickening and calcification. - Right ventricle: Systolic pressure was increased. - Pulmonary arteries: PA peak pressure: 95mm Hg (S). Impressions:  - The right ventricular systolic pressure was increased consistent  with moderate pulmonary hypertension. Transthoracic echocardiography. M-mode, complete 2D, spectral Doppler, and color Doppler. Height: Height: 177.8cm. Height: 70in. Weight: Weight: 84.7kg. Weight: 186.3lb. Body mass index: BMI: 26.8kg/m^2. Body  surface area: BSA: 2.39m^2. Blood pressure: 121/80. Patient status: Inpatient. Location: Echo laboratory.   Assessment and Plan: 1. PAF Currently in SR, afib episodes infrequent but he states the pattern of afib is changing. He really does not want to go to antiarrythmics at this point and multaq and flecainide would not be an option for him due to LV dysfunction and CAD. Repeat echo, pt will be notified of results For now pt would like to try using extra metoprolol as needed for breakthrough episodes of afib  Continue eliquis  2. Cad Appears stable Denies chest pain  F/u in afib clinic as needed Has recall appointment with Dr. Tamala Julian after the first of the year  Geroge Baseman. Nathifa Ritthaler, [facility] [address] [place], [place] [address] [phone]

## 2015-08-24 NOTE — Telephone Encounter (Signed)
Agree with the AFib clinic. Let him know we may need to resume a medication to suppress AF. Has been on amio in past, but let's see what they recommend. Is he having angina?

## 2015-08-28 ENCOUNTER — Ambulatory Visit (HOSPITAL_COMMUNITY)
Admission: RE | Admit: 2015-08-28 | Discharge: 2015-08-28 | Disposition: A | Discharge status: Home / Self Care | Payer: Medicare Other | Source: Ambulatory Visit | Attending: Nurse Practitioner | Admitting: Nurse Practitioner

## 2015-08-28 DIAGNOSIS — E785 Hyperlipidemia, unspecified: Secondary | ICD-10-CM | POA: Insufficient documentation

## 2015-08-28 DIAGNOSIS — I480 Paroxysmal atrial fibrillation: Secondary | ICD-10-CM | POA: Diagnosis not present

## 2015-08-28 DIAGNOSIS — I340 Nonrheumatic mitral (valve) insufficiency: Secondary | ICD-10-CM | POA: Insufficient documentation

## 2015-08-28 DIAGNOSIS — Z951 Presence of aortocoronary bypass graft: Secondary | ICD-10-CM | POA: Diagnosis not present

## 2015-08-28 NOTE — Progress Notes (Signed)
  Echocardiogram 2D Echocardiogram has been performed.  Donata Clay 08/28/2015, 11:01 AM

## 2015-08-28 NOTE — Telephone Encounter (Signed)
F/u     Pt calling back to speak to [place] about A-fib clinic again. Pt aware Isaac [name] is not in the office today and will wait for her to call him back when she is back in.

## 2015-08-29 NOTE — Telephone Encounter (Signed)
Returned pt call. lmtcb if assistance is still needed 

## 2015-08-29 NOTE — Telephone Encounter (Signed)
Pt called back. Pt sts that an echo was ordered by Ceasar Lund when he was seen by her last wk. Greig Castilla pt that she will call with the results and any further recommendation.  Pt sts that is there are any changes to be made he would like to discuss with Dr.Smith first. Pt stshe will call back if any changes are indicated

## 2015-09-03 DIAGNOSIS — Z23 Encounter for immunization: Secondary | ICD-10-CM | POA: Diagnosis not present

## 2015-11-14 ENCOUNTER — Other Ambulatory Visit: Payer: Self-pay

## 2015-11-14 MED ORDER — CRESTOR 5 MG PO TABS: 5.0000 mg | ORAL_TABLET | Freq: Every day | ORAL | Status: DC

## 2015-11-14 MED FILL — ROSUVASTATIN CALCIUM 5 MG T: 5 | 30 days supply | Qty: 30 | Fill #0

## 2015-11-14 NOTE — Telephone Encounter (Signed)
Belva Crome, MD at 11/21/2014 4:41 PM  rosuvastatin (CRESTOR) 5 MG tabletTake 5 mg by mouth daily Patient Instructions     Your physician recommends that you continue on your current medications as directed. Please refer to the Current Medication list given to you today.

## 2015-11-27 ENCOUNTER — Other Ambulatory Visit: Payer: Self-pay | Admitting: Interventional Cardiology

## 2015-11-27 MED FILL — TAMSULOSIN HCL 0.4 MG CAP: 0.4 | 90 days supply | Qty: 90 | Fill #2

## 2015-11-27 MED FILL — METOPROLOL TARTRATE 25 MG T: 25 | 30 days supply | Qty: 30 | Fill #0

## 2015-11-27 NOTE — Telephone Encounter (Signed)
Belva Crome, MD at 11/21/2014 4:41 PM  metoprolol tartrate (LOPRESSOR) 25 MG tabletTAKE 1/2 TABLET BY MOUTH TWICE A DAY Patient Instructions     Your physician recommends that you continue on your current medications as directed. Please refer to the Current Medication list given to you today.

## 2015-12-14 ENCOUNTER — Other Ambulatory Visit: Payer: Self-pay | Admitting: *Deleted

## 2015-12-14 MED ORDER — ROSUVASTATIN CALCIUM 5 MG PO TABS: 5.0000 mg | ORAL_TABLET | Freq: Every day | ORAL | Status: DC

## 2015-12-14 MED FILL — ROSUVASTATIN CALCIUM 5 MG T: 5 | 30 days supply | Qty: 30 | Fill #0

## 2015-12-21 ENCOUNTER — Other Ambulatory Visit: Payer: Self-pay | Admitting: Interventional Cardiology

## 2015-12-21 MED FILL — ELIQUIS 5 MG TABLET: 5 | 30 days supply | Qty: 60 | Fill #0

## 2015-12-22 ENCOUNTER — Encounter: Payer: Self-pay | Admitting: Interventional Cardiology

## 2015-12-22 ENCOUNTER — Ambulatory Visit (INDEPENDENT_AMBULATORY_CARE_PROVIDER_SITE_OTHER): Payer: Medicare Other | Admitting: Interventional Cardiology

## 2015-12-22 VITALS — BP 118/80 | HR 66 | Ht 70.0 in | Wt 161.0 lb

## 2015-12-22 DIAGNOSIS — Z7901 Long term (current) use of anticoagulants: Secondary | ICD-10-CM | POA: Diagnosis not present

## 2015-12-22 DIAGNOSIS — I480 Paroxysmal atrial fibrillation: Secondary | ICD-10-CM

## 2015-12-22 DIAGNOSIS — I5042 Chronic combined systolic (congestive) and diastolic (congestive) heart failure: Secondary | ICD-10-CM | POA: Diagnosis not present

## 2015-12-22 DIAGNOSIS — I2510 Atherosclerotic heart disease of native coronary artery without angina pectoris: Secondary | ICD-10-CM | POA: Diagnosis not present

## 2015-12-22 LAB — BASIC METABOLIC PANEL
BUN: 15 mg/dL (ref 7–25)
CO2: 24 mmol/L (ref 20–31)
Calcium: 9.4 mg/dL (ref 8.6–10.3)
Chloride: 108 mmol/L (ref 98–110)
Creat: 1.03 mg/dL (ref 0.70–1.25)
GLUCOSE: 82 mg/dL (ref 65–99)
POTASSIUM: 4 mmol/L (ref 3.5–5.3)
SODIUM: 142 mmol/L (ref 135–146)

## 2015-12-22 LAB — MAGNESIUM: MAGNESIUM: 2 mg/dL (ref 1.5–2.5)

## 2015-12-22 NOTE — Patient Instructions (Addendum)
Medication Instructions:  Your physician recommends that you continue on your current medications as directed. Please refer to the Current Medication list given to you today.  Labwork: BMET/[place] today  Testing/Procedures: none  Follow-Up: Your physician wants you to follow-up in: 1 year with Dr Tamala Julian. (February 2018).  You will receive a reminder letter in the mail two months in advance. If you don't receive a letter, please call our office to schedule the follow-up appointment.  Any Other Special Instructions Will Be Listed Below (If Applicable).  You should receive a call from one of our pharmacists, Jinny Blossom or Gay Filler, in the next day or two to arrange an appointment for you in the [facility].  Call our office if you have not heard from one of them by the middle of next week.    If you need a refill on your cardiac medications before your next appointment, please call your pharmacy.

## 2015-12-22 NOTE — Progress Notes (Signed)
Cardiology Office Note   Date:  12/22/2015   ID:  Isaac [name], Isaac [name] 06/29/1951, MRN [id number]  PCP:  Henrine Screws, MD  Cardiologist:  Sinclair Grooms, MD   Chief Complaint  Patient presents with  . Coronary Artery Disease  . Atrial Fibrillation      History of Present Illness: Isaac [name] is a 65 y.o. male who presents for  Coronary artery disease, prior coronary bypass grafting ,  paroxysmalatrial fibrillation,  Chronic anticoagulation therapy,and hyperlipidemia.  No angina or chest discomfort of concern.  He has not had bleeding on anticoagulation therapy. He has been very active. He complains of increasingly frequent episodes of rapid irregular rhythm that can last up to 24 hours. When these episodes occur they cause orthostatic dizziness and fatigue. Was seen once in the last 6 months by the A. Fib clinic. Was in normal sinus rhythm when he occurred but states that the arrhythmia had resolved by the time he was seen. His episodes of occurred at least 5 times since the beginning of October.  They are disruptive to his lifestyle. No associated angina. Not associated with alcohol intake.  Past Medical History  Diagnosis Date  . Myocardial infarction ([place])   . Coronary artery disease   . Anginal pain ([place])   . Dysrhythmia   . CHF (congestive heart failure) ([place])   . Chest pain, unspecified   . S/P CABG (coronary artery bypass graft)   . METATARSALGIA   . CERVICAL SPINE DISORDER, NOS   . 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 OSTEOARTHROSIS, LOCAL, SCND, HAND     Past Surgical History  Procedure Laterality Date  . Cholecystectomy    . Appendectomy    . Coronary artery bypass graft  10/2009     Current Outpatient Prescriptions  Medication Sig Dispense Refill  . aspirin 81 MG tablet Take one (1) tablet by mouth daily on Mondays, Wednesdays, and Fridays.    Marland Kitchen ELIQUIS 5 MG TABS tablet TAKE 1 TABLET BY MOUTH 2 TIMES DAILY. 180 tablet 0  . metoprolol  tartrate (LOPRESSOR) 25 MG tablet TAKE 1/2 TABLET BY MOUTH TWICE DAILY 30 tablet 0  . nitroGLYCERIN (NITROSTAT) 0.4 MG SL tablet Place 0.4 mg under the tongue every 5 (five) minutes as needed for chest pain.    . rosuvastatin (CRESTOR) 5 MG tablet Take 1 tablet (5 mg total) by mouth daily. 30 tablet 0  . Tamsulosin HCl (FLOMAX) 0.4 MG CAPS Take 0.4 mg by mouth daily after supper.     No current facility-administered medications for this visit.    Allergies:   Review of patient's allergies indicates no known allergies.    Social History:  The patient  reports that he has never smoked. He has never used smokeless tobacco. He reports that he drinks alcohol. He reports that he does not use illicit drugs.   Family History:  The patient's family history includes Emphysema in his father and mother.    ROS:  Please see the history of present illness.   Otherwise, review of systems are positive for  One fall without head trauma.   All other systems are reviewed and negative.    PHYSICAL EXAM: VS:  BP 118/80 mmHg  Pulse 66  Ht 5\' 10"  (1.778 m)  Wt 161 lb (73.029 kg)  BMI 23.10 kg/m2 , BMI Body mass index is 23.1 kg/(m^2). GEN: Well nourished, well developed, in no acute distress HEENT: normal Neck: no JVD, carotid bruits,  or masses Cardiac: RRR.  There is no murmur, rub, or gallop. There is no edema. Respiratory:  clear to auscultation bilaterally, normal work of breathing. GI: soft, nontender, nondistended, + BS MS: no deformity or atrophy Skin: warm and dry, no rash Neuro:  Strength and sensation are intact Psych: euthymic mood, full affect   EKG:  EKG  Is ordered today. The ekg reveals  Normal sinus rhythm, left atrial   Abnormality, and QTC of 410 ms. QT 440 ms.   Recent Labs: No results found for requested labs within last 365 days.    Lipid Panel    Component Value Date/Time   CHOL 114 05/22/2012 0221   TRIG 105 05/22/2012 0221   HDL 39* 05/22/2012 0221   CHOLHDL 2.9  05/22/2012 0221   VLDL 21 05/22/2012 0221   LDLCALC 54 05/22/2012 0221      Wt Readings from Last 3 Encounters:  12/22/15 161 lb (73.029 kg)  08/23/15 163 lb 6.4 oz (74.118 kg)  11/21/14 172 lb (78.019 kg)      Other studies Reviewed: Additional studies/ records that were reviewed today include:  Reviewed the atrial fibrillation clinic note.. The findings include  A vague documentation of atrial fibrillation..   He has had this on numerous prior occasions and we have documented atrial fibrillation on continuous monitoring.    ASSESSMENT AND PLAN:  1. Paroxysmal atrial fibrillation (HCC)  At least 5 prolonged episodes since October. Associated with weakness and orthostatic dizziness.  2. Atherosclerosis of native coronary artery of native heart without angina pectoris  asymptomatic  3. Chronic combined systolic and diastolic CHF, NYHA class 2 (HCC)  no evidence of volume overload   4. Chronic anticoagulation therapy Apixaban  Current medicines are reviewed at length with the patient today.  The patient has the following concerns regarding medicines:  none.  The following changes/actions have been instituted:    Tikosyn is recommended is best option  after discussing the clinical data with Dr. Caryl Comes.   Basic metabolic panel and magnesium level  Labs/ tests ordered today include:   Orders Placed This Encounter  Procedures  . Basic metabolic panel  . Magnesium  . EKG 12-Lead     Disposition:   FU with HS in 1 year  Signed, Sinclair Grooms, MD  12/22/2015 9:24 AM    [facility] [place], [place], [place]  [address] Phone: [phone]; Fax: [phone]

## 2015-12-25 ENCOUNTER — Telehealth: Payer: Self-pay | Admitting: Pharmacist

## 2015-12-25 NOTE — Telephone Encounter (Signed)
Spoke with patient regarding Tikosyn initiation. Discussed medication in more depth as pt had a few questions before agreeing to try med. He called his insurance company - brand Tikosyn is not covered and would cost ~$7500 per year, generic is cheaper at ~$1400 per year. He is willing to try Tikosyn however would need to be placed on generic at discharge. Labwork and EKG from last week drawn at [facility] with Dr. Tamala Julian ok for Tikosyn start, pt reports he has not missed any doses of Eliquis in the last month, not on any QTc prolonging meds. Set up pharmacy visit for 2/14 for Tikosyn initiation per pt request.

## 2015-12-26 ENCOUNTER — Encounter (HOSPITAL_COMMUNITY): Payer: Self-pay

## 2015-12-26 ENCOUNTER — Ambulatory Visit (INDEPENDENT_AMBULATORY_CARE_PROVIDER_SITE_OTHER): Payer: Medicare Other | Admitting: Pharmacist

## 2015-12-26 ENCOUNTER — Inpatient Hospital Stay (HOSPITAL_COMMUNITY)
Admission: AD | Admit: 2015-12-26 | Discharge: 2015-12-29 | DRG: 310 | Disposition: A | Discharge status: Home / Self Care | Payer: Medicare Other | Source: Ambulatory Visit | Attending: Internal Medicine | Admitting: Internal Medicine

## 2015-12-26 VITALS — Wt 160.0 lb

## 2015-12-26 DIAGNOSIS — Z825 Family history of asthma and other chronic lower respiratory diseases: Secondary | ICD-10-CM

## 2015-12-26 DIAGNOSIS — Z7901 Long term (current) use of anticoagulants: Secondary | ICD-10-CM | POA: Diagnosis not present

## 2015-12-26 DIAGNOSIS — I252 Old myocardial infarction: Secondary | ICD-10-CM | POA: Diagnosis not present

## 2015-12-26 DIAGNOSIS — Z7982 Long term (current) use of aspirin: Secondary | ICD-10-CM

## 2015-12-26 DIAGNOSIS — Z951 Presence of aortocoronary bypass graft: Secondary | ICD-10-CM | POA: Diagnosis not present

## 2015-12-26 DIAGNOSIS — I2510 Atherosclerotic heart disease of native coronary artery without angina pectoris: Secondary | ICD-10-CM | POA: Diagnosis present

## 2015-12-26 DIAGNOSIS — I10 Essential (primary) hypertension: Secondary | ICD-10-CM | POA: Diagnosis not present

## 2015-12-26 DIAGNOSIS — E785 Hyperlipidemia, unspecified: Secondary | ICD-10-CM | POA: Diagnosis present

## 2015-12-26 DIAGNOSIS — I255 Ischemic cardiomyopathy: Secondary | ICD-10-CM | POA: Diagnosis not present

## 2015-12-26 DIAGNOSIS — I4892 Unspecified atrial flutter: Secondary | ICD-10-CM | POA: Diagnosis present

## 2015-12-26 DIAGNOSIS — M1990 Unspecified osteoarthritis, unspecified site: Secondary | ICD-10-CM | POA: Diagnosis present

## 2015-12-26 DIAGNOSIS — I480 Paroxysmal atrial fibrillation: Principal | ICD-10-CM | POA: Diagnosis present

## 2015-12-26 DIAGNOSIS — Z79899 Other long term (current) drug therapy: Secondary | ICD-10-CM

## 2015-12-26 DIAGNOSIS — I25810 Atherosclerosis of coronary artery bypass graft(s) without angina pectoris: Secondary | ICD-10-CM | POA: Diagnosis not present

## 2015-12-26 DIAGNOSIS — Z5181 Encounter for therapeutic drug level monitoring: Secondary | ICD-10-CM

## 2015-12-26 LAB — BASIC METABOLIC PANEL
ANION GAP: 8 (ref 5–15)
BUN: 15 mg/dL (ref 6–20)
CHLORIDE: 110 mmol/L (ref 101–111)
CO2: 24 mmol/L (ref 22–32)
Calcium: 9.9 mg/dL (ref 8.9–10.3)
Creatinine, Ser: 0.97 mg/dL (ref 0.61–1.24)
GFR calc non Af Amer: >60 mL/min (ref >60)
Glucose, Bld: 94 mg/dL (ref 65–99)
POTASSIUM: 4 mmol/L (ref 3.5–5.1)
Sodium: 142 mmol/L (ref 135–145)

## 2015-12-26 LAB — MAGNESIUM: Magnesium: 2.1 mg/dL (ref 1.7–2.4)

## 2015-12-26 MED ORDER — NITROGLYCERIN 0.4 MG SL SUBL: 0.4000 mg | SUBLINGUAL_TABLET | SUBLINGUAL | Status: DC | PRN

## 2015-12-26 MED ORDER — OFF THE BEAT BOOK
Freq: Once | Status: AC
  Administered 2015-12-26: 21:00:00
  Filled 2015-12-26: qty 1

## 2015-12-26 MED ORDER — SODIUM CHLORIDE 0.9% FLUSH
3.0000 mL | Freq: Two times a day (BID) | INTRAVENOUS | Status: DC
  Administered 2015-12-26 – 2015-12-28 (×4): 3 mL via INTRAVENOUS

## 2015-12-26 MED ORDER — TAMSULOSIN HCL 0.4 MG PO CAPS
0.4000 mg | ORAL_CAPSULE | Freq: Every day | ORAL | Status: DC
  Administered 2015-12-26 – 2015-12-28 (×3): 0.4 mg via ORAL
  Filled 2015-12-26 (×3): qty 1

## 2015-12-26 MED ORDER — SODIUM CHLORIDE 0.9 % IV SOLN: 250.0000 mL | INTRAVENOUS | Status: DC | PRN

## 2015-12-26 MED ORDER — SODIUM CHLORIDE 0.9% FLUSH: 3.0000 mL | INTRAVENOUS | Status: DC | PRN

## 2015-12-26 MED ORDER — METOPROLOL TARTRATE 12.5 MG HALF TABLET
12.5000 mg | ORAL_TABLET | Freq: Two times a day (BID) | ORAL | Status: DC
  Administered 2015-12-26 – 2015-12-28 (×4): 12.5 mg via ORAL
  Filled 2015-12-26 (×4): qty 1

## 2015-12-26 MED ORDER — DOFETILIDE 500 MCG PO CAPS
500.0000 ug | ORAL_CAPSULE | Freq: Two times a day (BID) | ORAL | Status: DC
Start: 2015-12-26 — End: 2015-12-29
  Administered 2015-12-26 – 2015-12-29 (×6): 500 ug via ORAL
  Filled 2015-12-26 (×6): qty 1

## 2015-12-26 MED ORDER — PANTOPRAZOLE SODIUM 20 MG PO TBEC
20.0000 mg | DELAYED_RELEASE_TABLET | Freq: Every day | ORAL | Status: DC
  Filled 2015-12-26: qty 1

## 2015-12-26 MED ORDER — ASPIRIN EC 81 MG PO TBEC
81.0000 mg | DELAYED_RELEASE_TABLET | Freq: Every day | ORAL | Status: DC
  Administered 2015-12-27 – 2015-12-29 (×2): 81 mg via ORAL
  Filled 2015-12-26 (×2): qty 1

## 2015-12-26 MED ORDER — ROSUVASTATIN CALCIUM 10 MG PO TABS
5.0000 mg | ORAL_TABLET | Freq: Every day | ORAL | Status: DC
  Administered 2015-12-26 – 2015-12-28 (×3): 5 mg via ORAL
  Filled 2015-12-26 (×3): qty 1

## 2015-12-26 MED ORDER — APIXABAN 5 MG PO TABS
5.0000 mg | ORAL_TABLET | Freq: Two times a day (BID) | ORAL | Status: DC
Start: 2015-12-26 — End: 2015-12-29
  Administered 2015-12-26 – 2015-12-29 (×6): 5 mg via ORAL
  Filled 2015-12-26 (×6): qty 1

## 2015-12-26 NOTE — Progress Notes (Signed)
Pharmacy Review for Dofetilide (Tikosyn) Initiation  Admit Complaint: 65 y.o. male admitted 12/26/2015 with atrial fibrillation to be initiated on dofetilide.   Assessment:  Patient Exclusion Criteria: If any screening criteria checked as "Yes", then  patient  should NOT receive dofetilide until criteria item is corrected. If "Yes" please indicate correction plan.  YES  NO Patient  Exclusion Criteria Correction Plan  []  [x]  Baseline QTc interval is greater than or equal to 440 msec. IF above YES box checked dofetilide contraindicated unless patient has ICD; then may proceed if QTc 500-550 msec or with known ventricular conduction abnormalities may proceed with QTc 550-600 msec. QTc =     []  [x]  Magnesium level is less than 1.8 mEq/l : Last magnesium:  Lab Results  Component Value Date   MG 2.1 12/26/2015         []  [x]  Potassium level is less than 4 mEq/l : Last potassium:  Lab Results  Component Value Date   K 4.0 12/26/2015         []  [x]  Patient is known or suspected to have a digoxin level greater than 2 ng/ml: No results found for: DIGOXIN    []  [x]  Creatinine clearance less than 20 ml/min (calculated using Cockcroft-Gault, actual body weight and serum creatinine): Estimated Creatinine Clearance: 75.9 mL/min (by C-G formula based on Cr of 0.97).    []  [x]  Patient has received drugs known to prolong the QT intervals within the last 48 hours (phenothiazines, tricyclics or tetracyclic antidepressants, erythromycin, H-1 antihistamines, cisapride, fluoroquinolones, azithromycin). Drugs not listed above may have an, as yet, undetected potential to prolong the QT interval, updated information on QT prolonging agents is available at this website:QT prolonging agents   []  [x]  Patient received a dose of hydrochlorothiazide (Oretic) alone or in any combination including triamterene (Dyazide, Maxzide) in the last 48 hours.   []  [x]  Patient received a medication known to increase dofetilide  plasma concentrations prior to initial dofetilide dose:  . Trimethoprim (Primsol, Proloprim) in the last 36 hours . Verapamil (Calan, Verelan) in the last 36 hours or a sustained release dose in the last 72 hours . Megestrol (Megace) in the last 5 days  . Cimetidine (Tagamet) in the last 6 hours . Ketoconazole (Nizoral) in the last 24 hours . Itraconazole (Sporanox) in the last 48 hours  . Prochlorperazine (Compazine) in the last 36 hours    []  [x]  Patient is known to have a history of torsades de pointes; congenital or acquired long QT syndromes.   []  [x]  Patient has received a Class 1 antiarrhythmic with less than 2 half-lives since last dose. (Disopyramide, Quinidine, Procainamide, Lidocaine, Mexiletine, Flecainide, Propafenone)   []  [x]  Patient has received amiodarone therapy in the past 3 months or amiodarone level is greater than 0.3 ng/ml.    Patient has been appropriately anticoagulated with apixaban 5mg  BID  Ordering provider was confirmed at [email or web address] if they are not listed on the [facility] Prescribers list.  Goal of Therapy: Follow renal function, electrolytes, potential drug interactions, and dose adjustment. Provide education and 1 week supply at discharge.  Plan:  [x]   Physician selected initial dose within range recommended for patients level of renal function - will monitor for response.  []   Physician selected initial dose outside of range recommended for patients level of renal function - will discuss if the dose should be altered at this time.   Select One Calculated CrCl  Dose q12h  [x]  > 60 ml/min 500 mcg  []   40-60 ml/min 250 mcg  []  20-40 ml/min 125 mcg   2. Follow up QTc after the first 5 doses, renal function, electrolytes (K & Mg) daily x 3     days, dose adjustment, success of initiation and facilitate 1 week discharge supply as     clinically indicated.  3. Initiate Tikosyn education video (Call [phone] and ask for video # 116).  4. Place  Enrollment Form on the chart for discharge supply of dofetilide.  Bonnita Nasuti Pharm.D. CPP, BCPS Clinical Pharmacist [phone] 12/26/2015 5:27 PM

## 2015-12-26 NOTE — Progress Notes (Signed)
Patient ID: [name]                 DOB: 23-Sep-2051, 65 yo                         MRN: [id number]     HPI: Isaac [name] is a 65 y.o. male patient of Dr. Tamala [name] who presents today for Tikosyn initiation. PMH is significant for paroxysmal afib, most recently with increasingly frequent episode sof rapid irregular rhythm that can last for up to 24 hours. When these episodes occur, they cause orthostatic dizziness and fatigue. He has had at least 5 episodes since the beginning of October. The decision was made at [facility] with Dr. Tamala [name] who consulted with Dr. Caryl [name] on 12/22/15 to pursue Tikosyn initiation.  We reviewed potential side effects of Tikosyn including QTc prolongation.He is aware of the importance of not missing doses and will call the office if he misses more than 2 doses in a row. He is aware to inform all of his other providers about Tikosyn and to call the office if there is ever a question about taking a new medication. Reviewed patient's medication list - he is not on any QTc prolonging drugs. He is anticoagulated on Eliquis and reports that he has not missed a dose in the last 30 days.  EKG reviewed by Dr. Caryl [name]. Sinus bradycardia with vent rate 56 bpm, QTc 453msec.  Labs: K 4, Mg 2.1, SCr 0.97, Wt 160 lbs, CrCl 76 mL/min  Past Medical History  Diagnosis Date  . Myocardial infarction ([place])   . Coronary artery disease   . Anginal pain ([place])   . Dysrhythmia   . CHF (congestive heart failure) ([place])   . Chest pain, unspecified   . S/P CABG (coronary artery bypass graft)   . METATARSALGIA   . CERVICAL SPINE DISORDER, NOS   . 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 OSTEOARTHROSIS, LOCAL, SCND, HAND     Current Outpatient Prescriptions on File Prior to Visit  Medication Sig Dispense Refill  . aspirin 81 MG tablet Take one (1) tablet by mouth daily on Mondays, Wednesdays, and Fridays.    . cholecalciferol (VITAMIN D) 400 units TABS tablet Take 1,200 Units by mouth.     Isaac [name]  5 MG TABS tablet TAKE 1 TABLET BY MOUTH 2 TIMES DAILY. 180 tablet 0  . metoprolol tartrate (LOPRESSOR) 25 MG tablet TAKE 1/2 TABLET BY MOUTH TWICE DAILY 30 tablet 0  . nitroGLYCERIN (NITROSTAT) 0.4 MG SL tablet Place 0.4 mg under the tongue every 5 (five) minutes as needed for chest pain.    . rosuvastatin (CRESTOR) 5 MG tablet Take 1 tablet (5 mg total) by mouth daily. 30 tablet 0  . Tamsulosin HCl (FLOMAX) 0.4 MG CAPS Take 0.4 mg by mouth daily after supper.     No current facility-administered medications on file prior to visit.    No Known Allergies  Assessment/Plan:  1. Atrial fibrillation - EKG reviewed by Dr. Caryl [name]. K and Mg ok for Tikosyn start, CrCl 14mL/min, anticipated starting dose of Tikosyn is 547mcg BID. Pt aware to report to admitting.   Megan E. Supple, PharmD, [place] [id number] [place], [place] [address] Phone: [phone]; Fax: [phone] 12/26/2015 12:08 PM

## 2015-12-26 NOTE — H&P (Signed)
H&P    Patient ID: Isaac [name] MRN: [id number], DOB/AGE: 04/09/1951 65 y.o.  Admit date: 12/26/2015 Date of Consult: 12/26/2015  Primary Physician: Henrine Screws, MD Primary Cardiologist: Dr. Tamala Julian  Reason for Admission: Tikosyn initiation  HPI: Isaac [name] is a 65 y.o. male with long history of PAFib dating back to the time of CABG on chronic anticoagulation without missing any doses comes to [facility] today for initiation of Tikosyn.  He has PMHx otherwise of remote MI, CAD/CABG 6 years ago, HLD.  He was seen by Dr. Tamala Julian last week with reports of increased episodes of palpitations 4-5 episodes in the last 3-4 months, and previously infrequently.   He is symptomatic with feeling more fatigued and orthostatic.  After discussion with Dr. Tamala Julian they felt time to start AAD therapy, he met today with Fuller Canada and understands the admission,. Possible side effects, and importance of taking his medicines on time and without missing doses.    He feels well currently, denies any kind of CP, palpitations or SOB, no dizziness, near syncope or syncope.  Past Medical History  Diagnosis Date  . Myocardial infarction ([place])   . Coronary artery disease   . Anginal pain ([place])   . Dysrhythmia   . CHF (congestive heart failure) ([place])   . Chest pain, unspecified   . S/P CABG (coronary artery bypass graft)   . METATARSALGIA   . CERVICAL SPINE DISORDER, NOS   . 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 OSTEOARTHROSIS, LOCAL, SCND, HAND      Surgical History:  Past Surgical History  Procedure Laterality Date  . Cholecystectomy    . Appendectomy    . Coronary artery bypass graft  10/2009     Prescriptions prior to admission  Medication Sig Dispense Refill Last Dose  . aspirin 81 MG tablet Take one (1) tablet by mouth daily on Mondays, Wednesdays, and Fridays.   Taking  . cholecalciferol (VITAMIN D) 400 units TABS tablet Take 1,200 Units by mouth.      Arne Cleveland 5 MG TABS tablet TAKE 1  TABLET BY MOUTH 2 TIMES DAILY. 180 tablet 0 Taking  . metoprolol tartrate (LOPRESSOR) 25 MG tablet TAKE 1/2 TABLET BY MOUTH TWICE DAILY 30 tablet 0 Taking  . nitroGLYCERIN (NITROSTAT) 0.4 MG SL tablet Place 0.4 mg under the tongue every 5 (five) minutes as needed for chest pain.   Taking  . rosuvastatin (CRESTOR) 5 MG tablet Take 1 tablet (5 mg total) by mouth daily. 30 tablet 0 Taking  . Tamsulosin HCl (FLOMAX) 0.4 MG CAPS Take 0.4 mg by mouth daily after supper.   Taking    Inpatient Medications:  . apixaban  5 mg Oral BID  . [START ON 12/27/2015] aspirin  81 mg Oral Daily  . dofetilide  500 mcg Oral BID  . metoprolol tartrate  12.5 mg Oral BID  . [START ON 12/27/2015] pantoprazole  20 mg Oral Daily  . rosuvastatin  5 mg Oral Daily  . sodium chloride flush  3 mL Intravenous Q12H  . tamsulosin  0.4 mg Oral QPC supper    Allergies: No Known Allergies  Social History   Social History  . Marital Status: Married    Spouse Name: N/A  . Number of Children: N/A  . Years of Education: N/A   Occupational History  . Not on file.   Social History Main Topics  . Smoking status: Never Smoker   . Smokeless tobacco: Never Used  .  Alcohol Use: Yes     Comment: 3  . Drug Use: No  . Sexual Activity: Not Currently   Other Topics Concern  . Not on file   Social History Narrative     Family History  Problem Relation Age of Onset  . Emphysema Mother   . Emphysema Father      Review of Systems: All other systems reviewed and are otherwise negative except as noted above.  Physical Exam: Filed Vitals:   12/26/15 1547 12/26/15 1555  BP:  122/77  Pulse:  58  Temp:  98 F (36.7 C)  TempSrc:  Oral  Resp:  18  Height: _0  (1.778 m)   Weight: 160 lb (72.576 kg)   SpO2:  97%    GEN- The patient is well appearing, alert and oriented x 3 today.   HEENT: normocephalic, atraumatic; sclera clear, conjunctiva pink; hearing intact; oropharynx clear; neck supple, no JVP Lymph- no  cervical lymphadenopathy Lungs- Clear to ausculation bilaterally, normal work of breathing.  No wheezes, rales, rhonchi Heart- Regular rate and rhythm, no murmurs, rubs or gallops, PMI not laterally displaced GI- soft, non-tender, non-distended, bowel sounds present Extremities- no clubbing, cyanosis, or edema MS- no significant deformity or atrophy Skin- warm and dry, no rash or lesion Psych- euthymic mood, full affect Neuro- no gross deficits observed  Labs:   Lab Results  Component Value Date   WBC 6.8 07/14/2014   HGB 14.8 07/14/2014   HCT 44.2 07/14/2014   MCV 91.9 07/14/2014   PLT 196.0 07/14/2014    Recent Labs Lab 12/26/15 1109  NA 142  K 4.0  CL 110  CO2 24  BUN 15  CREATININE 0.97  CALCIUM 9.9  GLUCOSE 94      Radiology/Studies: No results found.  EKG: [facility] as reviewed by Dr. Caryl Comes, [place] with QTc 401 TELEMETRY: SR 60's  08/28/15: Echocardiogram Study Conclusions - Left ventricle: The cavity size was normal. Systolic function was mildly to moderately reduced. The estimated ejection fraction was in the range of 40% to 45%. Diffuse hypokinesis. Hypokinesis of the anteroseptal myocardium is most prominent. Doppler parameters are consistent with abnormal left ventricular relaxation (grade 1 diastolic dysfunction). - Aortic valve: Transvalvular velocity was within the normal range. There was no stenosis. There was no regurgitation. - Mitral valve: There was mild regurgitation. - Left atrium: The atrium was moderately dilated. - Right ventricle: The cavity size was normal. Wall thickness was normal. Systolic function was normal. - Atrial septum: No defect or patent foramen ovale was identified. - Pulmonary arteries: PA peak pressure: 33 mm Hg (S). - Inferior vena cava: The vessel was dilated. The respirophasic diameter changes were blunted (< 50%), consistent with elevated central venous pressure.  Assessment and Plan:    1. PAfib     Admitted for Tikosyn initiation     K+ 4.0     Mag 2.1     Creat 0.75m calc Cr. Cl 75.86     Start Tikosyn 50108m BID  2. CAD     No CP  Signed, ReTommye StandardPA-C 12/26/2015 5:22 PM  I have seen, examined the patient, and reviewed the above assessment and plan. On exam, RRR.  Changes to above are made where necessary.  Admitted for tikosyn load.  chads2vasc score is 3.  He is appropriately anticoagulated with eliquis.  Co Sign: JaThompson GrayerMD 12/26/2015 5:36 PM

## 2015-12-27 LAB — BASIC METABOLIC PANEL
ANION GAP: 8 (ref 5–15)
BUN: 12 mg/dL (ref 6–20)
CHLORIDE: 108 mmol/L (ref 101–111)
CO2: 28 mmol/L (ref 22–32)
CREATININE: 1.05 mg/dL (ref 0.61–1.24)
Calcium: 9.4 mg/dL (ref 8.9–10.3)
GFR calc non Af Amer: >60 mL/min (ref >60)
Glucose, Bld: 95 mg/dL (ref 65–99)
Potassium: 3.7 mmol/L (ref 3.5–5.1)
SODIUM: 144 mmol/L (ref 135–145)

## 2015-12-27 LAB — MAGNESIUM: Magnesium: 2.2 mg/dL (ref 1.7–2.4)

## 2015-12-27 MED ORDER — POTASSIUM CHLORIDE CRYS ER 20 MEQ PO TBCR
40.0000 meq | EXTENDED_RELEASE_TABLET | Freq: Once | ORAL | Status: AC
  Administered 2015-12-27: 40 meq via ORAL
  Filled 2015-12-27: qty 2

## 2015-12-27 NOTE — Progress Notes (Addendum)
The client is Tikosyn loading and is receiving his 3rd dose. The morning lab work had a potassium of 3.7 and the client received 40 mEq today. I notified cardiology on call Philbert Riser to verify I could still give the dose without another lab draw. Mag was 2.2 this morning, current QTc is 448, and the client has no complains.   Philbert Riser stated it is okay to give the Tikosyn at this time. 2130. I will continue to monitor the client closely.

## 2015-12-27 NOTE — Care Management Note (Addendum)
Case Management Note  Patient Details  Name: Isaac [name] MRN: [id number] Date of Birth: 1951/05/01  Subjective/Objective:    Pt admitted for Tikosyn Load:  Paroxysmal Afib            Action/Plan: Benefits check in process for cost of medication. CM will make pt aware once completed. CM will assist with the 7 day Rx to be filled via [facility]. No further needs at this time.   Expected Discharge Date:  12/28/15               Expected Discharge Plan:  Home/Self Care  In-House Referral:  NA  Discharge planning Services  CM Consult, Medication Assistance  Post Acute Care Choice:  NA Choice offered to:  NA  DME Arranged:  N/A DME Agency:  NA  HH Arranged:  NA HH Agency:  NA  Status of Service:  Completed, signed off  Medicare Important Message Given:    Date Medicare IM Given:    Medicare IM give by:    Date Additional Medicare IM Given:    Additional Medicare Important Message give by:     If discussed at [place] of Stay Meetings, dates discussed:    Additional Comments:   S/W BRITTANY @ [facility] # [phone]   TIKOSYN 500 MCG BID  * NOT COVERED *   DOFETILIDE 500 MCG BID FOR 30 DAYS SUPPLY   COVER- YES  CO-PAY- $95.00  TIER- 4 DRUG  PRIOR APPROVAL - NO  PHARMACY : [facility]  MAIL ORDER FOR 90 DAYS SUPPLY $ 275.00     Bethena Roys, RN 12/27/2015, 2:42 PM

## 2015-12-27 NOTE — Plan of Care (Signed)
Problem: Education: Goal: Knowledge of disease or condition will improve Outcome: Completed/Met Date Met:  12/27/15 Pt given education material on atrial arhythmia. Provided Off the Beat booklet, print out on Tikosyn as well as stroke prevention. Reviewed with pt, at this time pt has no further questions.

## 2015-12-27 NOTE — Discharge Instructions (Addendum)
You have an appointment set up with the Atrial Fibrillation Clinic.  Multiple studies have shown that being followed by a dedicated atrial fibrillation clinic in addition to the standard care you receive from your other physicians improves health. We believe that enrollment in the atrial fibrillation clinic will allow us to better care for you.  ° °The phone number to the Atrial Fibrillation Clinic is [phone]. The clinic is staffed Monday through Friday from 8:30am to 5pm. ° °Parking Directions: The clinic is located in the Heart and Vascular Building connected to [facility]. °1)From [address] turn on to [address] and go to the 3rd entrance  (Heart and Vascular entrance) on the right. °2)Look to the right for Heart &Vascular Parking Garage. °3)A code for the entrance is required please call the clinic to receive this.   °4)Take the elevators to the 1st floor. Registration is in the room with the glass walls at the end of the hallway. ° °If you have any trouble parking or locating the clinic, please don’t hesitate to call [phone]. ° ° °Information on my medicine - ELIQUIS® (apixaban) ° °This medication education was reviewed with me or my healthcare representative as part of my discharge preparation.  The pharmacist that spoke with me during my hospital stay was:  Robertson, Crystal Stillinger, RPH ° °Why was Eliquis® prescribed for you? °Eliquis® was prescribed for you to reduce the risk of a blood clot forming that can cause a stroke if you have a medical condition called atrial fibrillation (a type of irregular heartbeat). ° °What do You need to know about Eliquis® ? °Take your Eliquis® TWICE DAILY - one tablet in the morning and one tablet in the evening with or without food. If you have difficulty swallowing the tablet whole please discuss with your pharmacist how to take the medication safely. ° °Take Eliquis® exactly as prescribed by your doctor and DO NOT stop taking Eliquis®  without talking to the doctor who prescribed the medication.  Stopping may increase your risk of developing a stroke.  Refill your prescription before you run out. ° °After discharge, you should have regular check-up appointments with your healthcare provider that is prescribing your Eliquis®.  In the future your dose may need to be changed if your kidney function or weight changes by a significant amount or as you get older. ° °What do you do if you miss a dose? °If you miss a dose, take it as soon as you remember on the same day and resume taking twice daily.  Do not take more than one dose of ELIQUIS at the same time to make up a missed dose. ° °Important Safety Information °A possible side effect of Eliquis® is bleeding. You should call your healthcare provider right away if you experience any of the following: °? Bleeding from an injury or your nose that does not stop. °? Unusual colored urine (red or dark brown) or unusual colored stools (red or black). °? Unusual bruising for unknown reasons. °? A serious fall or if you hit your head (even if there is no bleeding). ° °Some medicines may interact with Eliquis® and might increase your risk of bleeding or clotting while on Eliquis®. To help avoid this, consult your healthcare provider or pharmacist prior to using any new prescription or non-prescription medications, including herbals, vitamins, non-steroidal anti-inflammatory drugs (NSAIDs) and supplements. ° °This website has more information on Eliquis® (apixaban): http://www.eliquis.com/eliquis/home ° °

## 2015-12-27 NOTE — Progress Notes (Signed)
UR Completed Terri Rorrer Graves-Bigelow, RN,BSN [phone]  

## 2015-12-27 NOTE — Progress Notes (Signed)
    SUBJECTIVE: The patient is doing well today, felt his AFlutter episode last night.  At this time, he denies chest pain, shortness of breath, or any new concerns.  Marland Kitchen apixaban  5 mg Oral BID  . aspirin EC  81 mg Oral Daily  . dofetilide  500 mcg Oral BID  . metoprolol tartrate  12.5 mg Oral BID  . pantoprazole  20 mg Oral Daily  . rosuvastatin  5 mg Oral q1800  . sodium chloride flush  3 mL Intravenous Q12H  . tamsulosin  0.4 mg Oral QHS      OBJECTIVE: Physical Exam: Filed Vitals:   12/26/15 1547 12/26/15 1555 12/26/15 1938 12/27/15 0337  BP:  122/77 112/70 96/70  Pulse:  58 57 84  Temp:  98 F (36.7 C) 98 F (36.7 C) 98.2 F (36.8 C)  TempSrc:  Oral Oral Oral  Resp:  18 17 18   Height: 5\' 10"  (1.778 m)     Weight: 160 lb (72.576 kg)   156 lb 9.6 oz (71.033 kg)  SpO2:  97% 98% 98%    Intake/Output Summary (Last 24 hours) at 12/27/15 0442 Last data filed at 12/26/15 2248  Gross per 24 hour  Intake    680 ml  Output    950 ml  Net   -270 ml    Telemetry reveals SR 60's this morning, he had PAFib/flutter last night, SB 49-50's  GEN- The patient is well appearing, alert and oriented x 3 today.   Head- normocephalic, atraumatic Eyes-  Sclera clear, conjunctiva pink Ears- hearing intact Oropharynx- clear Neck- supple, no JVP Lungs- Clear to ausculation bilaterally, normal work of breathing Heart- Regular rate and rhythm, no significant murmurs, no rubs or gallops GI- soft, NT, ND Extremities- no clubbing, cyanosis, or edema Skin- no rash or lesion Psych- euthymic mood, full affect Neuro- no gross deficits appreciated  LABS: Basic Metabolic Panel:  Recent Labs  12/26/15 1109  NA 142  K 4.0  CL 110  CO2 24  GLUCOSE 94  BUN 15  CREATININE 0.97  CALCIUM 9.9  MG 2.1     ASSESSMENT AND PLAN:  Active Problems:   Visit for monitoring Tikosyn therapy  1. Paroxysmal Afib     CHADS2Vasc is at least 3 on Eliquis     Tikosyn initiation s/p #1 dose  EKG/ QTc appears stable     K+ is 3.7, will replace     Mag is 2.2     Creat is 1.05, calc Creat Cl 68.33  2. CAD      No CP, VSS  3. Patient reported on admit that he was taking an OTC PPI daily at home,      He corrects this today, was using a Zyrtec, will stop the Protonix.  Tommye Standard, PA-C 12/27/2015 4:42 AM   I have seen, examined the patient, and reviewed the above assessment and plan.  On exam, RRR. Changes to above are made where necessary.   Continue tikosyn and follow closely.  Co Sign: Thompson Grayer, MD 12/27/2015 11:57 AM

## 2015-12-28 LAB — BASIC METABOLIC PANEL
ANION GAP: 9 (ref 5–15)
BUN: 13 mg/dL (ref 6–20)
CHLORIDE: 110 mmol/L (ref 101–111)
CO2: 24 mmol/L (ref 22–32)
Calcium: 9.1 mg/dL (ref 8.9–10.3)
Creatinine, Ser: 1.02 mg/dL (ref 0.61–1.24)
Glucose, Bld: 93 mg/dL (ref 65–99)
POTASSIUM: 3.7 mmol/L (ref 3.5–5.1)
SODIUM: 143 mmol/L (ref 135–145)

## 2015-12-28 LAB — MAGNESIUM: MAGNESIUM: 2 mg/dL (ref 1.7–2.4)

## 2015-12-28 MED ORDER — POTASSIUM CHLORIDE CRYS ER 20 MEQ PO TBCR
40.0000 meq | EXTENDED_RELEASE_TABLET | Freq: Two times a day (BID) | ORAL | Status: AC
Start: 2015-12-28 — End: 2015-12-28
  Administered 2015-12-28 (×2): 40 meq via ORAL
  Filled 2015-12-28 (×2): qty 2

## 2015-12-28 NOTE — Progress Notes (Signed)
    SUBJECTIVE: The patient is doing well today.  At this time, he denies chest pain, shortness of breath, or any new concerns.  Marland Kitchen apixaban  5 mg Oral BID  . aspirin EC  81 mg Oral Daily  . dofetilide  500 mcg Oral BID  . metoprolol tartrate  12.5 mg Oral BID  . potassium chloride  40 mEq Oral BID  . rosuvastatin  5 mg Oral q1800  . sodium chloride flush  3 mL Intravenous Q12H  . tamsulosin  0.4 mg Oral QHS      OBJECTIVE: Physical Exam: Filed Vitals:   12/27/15 0800 12/27/15 1500 12/27/15 2120 12/28/15 0545  BP: 106/64 112/68 116/67 94/58  Pulse:   54 60  Temp:  97.8 F (36.6 C) 97.5 F (36.4 C) 97.6 F (36.4 C)  TempSrc:  Oral Oral Oral  Resp:   20 18  Height:      Weight:    156 lb 9.6 oz (71.033 kg)  SpO2:  99% 97% 98%    Intake/Output Summary (Last 24 hours) at 12/28/15 0944 Last data filed at 12/28/15 0900  Gross per 24 hour  Intake   1200 ml  Output   1500 ml  Net   -300 ml    Telemetry reveals SR 60's this morning,  SB 49-50's   GEN- The patient is well appearing, alert and oriented x 3 today.   Head- normocephalic, atraumatic Eyes-  Sclera clear, conjunctiva pink Ears- hearing intact Oropharynx- clear Neck- supple, no JVP Lungs- Clear to ausculation bilaterally, normal work of breathing Heart- Regular rate and rhythm, no significant murmurs, no rubs or gallops GI- soft, NT, ND Extremities- no clubbing, cyanosis, or edema Skin- no rash or lesion Psych- euthymic mood, full affect Neuro- no gross deficits appreciated  LABS: Basic Metabolic Panel:  Recent Labs  12/27/15 0639 12/28/15 0423  NA 144 143  K 3.7 3.7  CL 108 110  CO2 28 24  GLUCOSE 95 93  BUN 12 13  CREATININE 1.05 1.02  CALCIUM 9.4 9.1  MG 2.2 2.0     ASSESSMENT AND PLAN:  Active Problems:   Visit for monitoring Tikosyn therapy  1. Paroxysmal Afib     CHADS2Vasc is at least 3 on Eliquis     Tikosyn initiation s/p #3 doses     EKG/ QTc appears stable, EKG reviewed  with Dr. Rayann Heman     K+ is 3.7, will replace again today     Mag is 2.0     Creat is 1.02, stable  2. CAD      No CP, VSS  Tommye Standard, PA-C 12/28/2015 9:44 AM   I have seen, examined the patient, and reviewed the above assessment and plan.  On exam, RRR. Changes to above are made where necessary.  QT is stable.  Will follow closely  Anticipate discharge to home tomorrow if no events overnight.  Hold metoprolol due to bradycardia.  Co Sign: Thompson Grayer, MD 12/28/2015 12:18 PM

## 2015-12-29 ENCOUNTER — Telehealth: Payer: Self-pay

## 2015-12-29 LAB — BASIC METABOLIC PANEL
ANION GAP: 9 (ref 5–15)
BUN: 12 mg/dL (ref 6–20)
CALCIUM: 9.5 mg/dL (ref 8.9–10.3)
CO2: 26 mmol/L (ref 22–32)
Chloride: 109 mmol/L (ref 101–111)
Creatinine, Ser: 1.04 mg/dL (ref 0.61–1.24)
Glucose, Bld: 90 mg/dL (ref 65–99)
Potassium: 4.1 mmol/L (ref 3.5–5.1)
SODIUM: 144 mmol/L (ref 135–145)

## 2015-12-29 LAB — MAGNESIUM: MAGNESIUM: 2.2 mg/dL (ref 1.7–2.4)

## 2015-12-29 MED ORDER — DOFETILIDE 500 MCG PO CAPS: 500.0000 ug | ORAL_CAPSULE | Freq: Two times a day (BID) | ORAL | Status: DC

## 2015-12-29 MED FILL — *DOFETILIDE 500 MCG CAPSULE: 500 | 30 days supply | Qty: 60 | Fill #0

## 2015-12-29 NOTE — Care Management Important Message (Signed)
Important Message  Patient Details  Name: Isaac [name] MRN: [id number] Date of Birth: [birth year]   Medicare Important Message Given:  Yes    Nathen May 12/29/2015, 11:45 AM

## 2015-12-29 NOTE — Telephone Encounter (Signed)
Prior auth for Eliquis 5 mg sent to [facility]. 

## 2015-12-29 NOTE — Progress Notes (Signed)
Pt assessment unchanged from this am. I reviewed d/c instructions with patient. No further questions. D/c'd home via wheelchair to private vehicle with wife in stable condition

## 2015-12-29 NOTE — Discharge Summary (Signed)
Marland Kitchen     ELECTROPHYSIOLOGY PROCEDURE DISCHARGE SUMMARY    Patient ID: EVANGELOS DU,  MRN: [id number], DOB/AGE: 67-Apr-1952 67 y.o.  Admit date: 12/26/2015 Discharge date: 12/29/2015  Primary Care Physician: Henrine Screws, MD Primary Cardiologist: Dr. Tamala Julian Electrophysiologist: (New) Dr. Rayann Heman  Primary Discharge Diagnosis:  1.  Parixysmalatrial fibrillation status post Tikosyn loading this admission      CHADS2Vasc is at least 2, on Eliquis  Secondary Discharge Diagnosis:  1. CAD 2. HLD  No Known Allergies   Procedures This Admission:  1.  Tikosyn loading  Brief HPI: LAURENZ DERDERIAN is a 65 y.o. male with a past medical history as noted above.  The patient was recommended for Tikosyn initiation for ADD by his primary cardiologist.  Risks, benefits, and alternatives to Tikosyn were reviewed with the patient during his pharmacy visit for pre-Tikosyn initiation discussion, as well at his admission here, who wished to proceed.    Hospital Course:  The patient was admitted and Tikosyn was initiated.  Renal function and electrolytes were followed during the hospitalization.  Their QTc remained stable.  He had PAF while here, though has regained SR prior to discharge.  On the day of discharge, they were examined by Dr Rayann Heman who considered them stable for discharge to home.  Follow-up has been arranged with the Afib clinic in 1 week and with Dr Rayann Heman in 4 weeks.   Physical Exam: Filed Vitals:   12/28/15 1001 12/28/15 1346 12/28/15 1936 12/29/15 0647  BP: 111/74 97/66 117/73 87/59  Pulse: 63 56 55 74  Temp:  98.2 F (36.8 C) 97.4 F (36.3 C) 98.1 F (36.7 C)  TempSrc:  Oral Oral   Resp:  18 17 18   Height:      Weight:    157 lb 1.6 oz (71.26 kg)  SpO2:  99% 98% 96%    GEN- The patient is well appearing, alert and oriented x 3 today.   HEENT: normocephalic, atraumatic; sclera clear, conjunctiva pink; hearing intact; oropharynx clear; neck supple, no JVP Lymph- no  cervical lymphadenopathy Lungs- Clear to ausculation bilaterally, normal work of breathing.  No wheezes, rales, rhonchi Heart- Regular rate and rhythm, no murmurs, rubs or gallops, PMI not laterally displaced GI- soft, non-tender, non-distended, bowel sounds present, no hepatosplenomegaly Extremities- no clubbing, cyanosis, or edema, warm. MS- no significant deformity or atrophy Skin- warm and dry, no rash or lesion Psych- euthymic mood, full affect Neuro- strength and sensation are intact   Labs:   Lab Results  Component Value Date   WBC 6.8 07/14/2014   HGB 14.8 07/14/2014   HCT 44.2 07/14/2014   MCV 91.9 07/14/2014   PLT 196.0 07/14/2014     Recent Labs Lab 12/29/15 0450  NA 144  K 4.1  CL 109  CO2 26  BUN 12  CREATININE 1.04  CALCIUM 9.5  GLUCOSE 90     Discharge Medications:    Medication List    TAKE these medications        aspirin 81 MG tablet  Take one (1) tablet by mouth daily on Mondays, Wednesdays, and Fridays.     cetirizine 10 MG tablet  Commonly known as:  ZYRTEC  Take 10 mg by mouth daily.     cholecalciferol 400 units Tabs tablet  Commonly known as:  VITAMIN D  Take 1,200 Units by mouth daily.     dofetilide 500 MCG capsule  Commonly known as:  TIKOSYN  Take 1 capsule (500 mcg total) by  mouth 2 (two) times daily.     ELIQUIS 5 MG Tabs tablet  Generic drug:  apixaban  TAKE 1 TABLET BY MOUTH 2 TIMES DAILY.     metoprolol tartrate 25 MG tablet  Commonly known as:  LOPRESSOR  Take 12.5 mg by mouth 2 (two) times daily.     nitroGLYCERIN 0.4 MG SL tablet  Commonly known as:  NITROSTAT  Place 0.4 mg under the tongue every 5 (five) minutes as needed for chest pain.     rosuvastatin 5 MG tablet  Commonly known as:  CRESTOR  Take 1 tablet (5 mg total) by mouth daily.     tamsulosin 0.4 MG Caps capsule  Commonly known as:  FLOMAX  Take 0.4 mg by mouth daily after supper.        Disposition:  Discharge Instructions    Diet - low  sodium heart healthy    Complete by:  As directed      Increase activity slowly    Complete by:  As directed           Follow-up Information    Follow up with [facility] On 01/04/2016.   Specialty:  Cardiology   Why:  9:00AM   Contact information:   [id number] [place] [phone]      Follow up with Thompson Grayer, MD On 01/25/2016.   Specialty:  Cardiology   Why:  8:45AM   Contact information:   [place] [address] [phone]       Duration of Discharge Encounter: Greater than 30 minutes including physician time.  Signed, Tommye Standard, PA-C 12/29/2015 12:04 PM    I have seen, examined the patient, and reviewed the above assessment and plan.  On exam, iRRR. Changes to above are made where necessary.    Co Sign: Thompson Grayer, MD 12/29/2015 4:31 PM

## 2015-12-29 NOTE — Progress Notes (Signed)
The client is Tikosyn loading and will be receiving his 5th dose. The morning lab work had a potassium of 3.7 and the client received 40 mEq today and will receive 40 mEq tonight. I notified cardiology on call Eula Fried to verify I could still give the dose without another lab draw. Mag was 2.0  this morning, current QTc is 460 , and the client has no complains at this time.    Eula Fried stated it is okay to give the Tikosyn at this time. I will continue to monitor the client closely.

## 2015-12-29 NOTE — Progress Notes (Signed)
Pt converted back to NSR, EKG done at 11 am post 2 hours from last tikosyn dose. I paged Joseph Art so she could look at the EKG. Will continue to monitor

## 2015-12-29 NOTE — Progress Notes (Signed)
Pt converted to afib rates anywhere from 90s to 140s. Pt asymptomatic. Renee, PA on floor and is aware. EKG done and given to her. Plan is for Dr. Rayann Heman to come and see pt. She wanted to hold on tikosyn until seen by MD. Pt having what appears to be PVC and short runs of [place]. Renee made aware.

## 2016-01-01 ENCOUNTER — Other Ambulatory Visit: Payer: Self-pay | Admitting: Interventional Cardiology

## 2016-01-01 MED FILL — METOPROLOL TARTRATE 25 MG T: 25 | 30 days supply | Qty: 30 | Fill #0

## 2016-01-01 NOTE — Telephone Encounter (Signed)
REFILL 

## 2016-01-04 ENCOUNTER — Ambulatory Visit (HOSPITAL_COMMUNITY)
Admit: 2016-01-04 | Discharge: 2016-01-04 | Disposition: A | Discharge status: Home / Self Care | Payer: Medicare Other | Source: Ambulatory Visit | Attending: Nurse Practitioner | Admitting: Nurse Practitioner

## 2016-01-04 ENCOUNTER — Encounter (HOSPITAL_COMMUNITY): Payer: Self-pay | Admitting: Nurse Practitioner

## 2016-01-04 VITALS — BP 110/70 | HR 60 | Ht 70.0 in | Wt 162.0 lb

## 2016-01-04 DIAGNOSIS — Z951 Presence of aortocoronary bypass graft: Secondary | ICD-10-CM | POA: Diagnosis not present

## 2016-01-04 DIAGNOSIS — Z79899 Other long term (current) drug therapy: Secondary | ICD-10-CM | POA: Diagnosis not present

## 2016-01-04 DIAGNOSIS — Z7902 Long term (current) use of antithrombotics/antiplatelets: Secondary | ICD-10-CM | POA: Diagnosis not present

## 2016-01-04 DIAGNOSIS — Z7982 Long term (current) use of aspirin: Secondary | ICD-10-CM | POA: Diagnosis not present

## 2016-01-04 DIAGNOSIS — I252 Old myocardial infarction: Secondary | ICD-10-CM | POA: Insufficient documentation

## 2016-01-04 DIAGNOSIS — I4891 Unspecified atrial fibrillation: Secondary | ICD-10-CM | POA: Diagnosis not present

## 2016-01-04 DIAGNOSIS — I480 Paroxysmal atrial fibrillation: Secondary | ICD-10-CM | POA: Diagnosis not present

## 2016-01-04 DIAGNOSIS — I2510 Atherosclerotic heart disease of native coronary artery without angina pectoris: Secondary | ICD-10-CM | POA: Diagnosis not present

## 2016-01-04 DIAGNOSIS — Z825 Family history of asthma and other chronic lower respiratory diseases: Secondary | ICD-10-CM | POA: Insufficient documentation

## 2016-01-04 LAB — BASIC METABOLIC PANEL
Anion gap: 11 (ref 5–15)
BUN: 12 mg/dL (ref 6–20)
CHLORIDE: 108 mmol/L (ref 101–111)
CO2: 25 mmol/L (ref 22–32)
CREATININE: 1.01 mg/dL (ref 0.61–1.24)
Calcium: 9.6 mg/dL (ref 8.9–10.3)
GFR calc Af Amer: >60 mL/min (ref >60)
GFR calc non Af Amer: >60 mL/min (ref >60)
Glucose, Bld: 94 mg/dL (ref 65–99)
POTASSIUM: 3.9 mmol/L (ref 3.5–5.1)
Sodium: 144 mmol/L (ref 135–145)

## 2016-01-04 LAB — MAGNESIUM: Magnesium: 2.2 mg/dL (ref 1.7–2.4)

## 2016-01-04 NOTE — Progress Notes (Signed)
Patient ID: Isaac [name], male   DOB: 07-Nov-1951, 65 y.o.   MRN: [id number]     Primary Care Physician: Henrine Screws, MD Referring Physician: Dr. Carolin Sicks Isaac [name] is a 65 y.o. male with a h/o afib that was in the hospital 2/14 to 2/17 for dofetilide loading. QTc remained stable. Renal function and electrolytes were stable. He reports that he has not any afib since d/c and is tolerating well. Continues on eliquis.  Today, he denies symptoms of palpitations, chest pain, shortness of breath, orthopnea, PND, lower extremity edema, dizziness, presyncope, syncope, or neurologic sequela. The patient is tolerating medications without difficulties and is otherwise without complaint today.   Past Medical History  Diagnosis Date  . Myocardial infarction ([place])   . Coronary artery disease   . Anginal pain ([place])   . Dysrhythmia   . CHF (congestive heart failure) ([place])   . Chest pain, unspecified   . S/P CABG (coronary artery bypass graft)   . METATARSALGIA   . CERVICAL SPINE DISORDER, NOS   . 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 OSTEOARTHROSIS, LOCAL, SCND, HAND    Past Surgical History  Procedure Laterality Date  . Cholecystectomy    . Appendectomy    . Coronary artery bypass graft  10/2009    Current Outpatient Prescriptions  Medication Sig Dispense Refill  . aspirin 81 MG tablet Take one (1) tablet by mouth daily on Mondays, Wednesdays, and Fridays.    . cetirizine (ZYRTEC) 10 MG tablet Take 10 mg by mouth daily.    . cholecalciferol (VITAMIN D) 400 units TABS tablet Take 1,200 Units by mouth daily.     Marland Kitchen dofetilide (TIKOSYN) 500 MCG capsule Take 1 capsule (500 mcg total) by mouth 2 (two) times daily. 60 capsule 6  . ELIQUIS 5 MG TABS tablet TAKE 1 TABLET BY MOUTH 2 TIMES DAILY. 180 tablet 0  . metoprolol tartrate (LOPRESSOR) 25 MG tablet Take 0.5 tablets (12.5 mg total) by mouth 2 (two) times daily. 30 tablet 11  . nitroGLYCERIN (NITROSTAT) 0.4 MG SL tablet Place 0.4  mg under the tongue every 5 (five) minutes as needed for chest pain.    . rosuvastatin (CRESTOR) 5 MG tablet Take 1 tablet (5 mg total) by mouth daily. (Patient taking differently: Take 5 mg by mouth daily at 6 PM. ) 30 tablet 0  . Tamsulosin HCl (FLOMAX) 0.4 MG CAPS Take 0.4 mg by mouth daily after supper.     No current facility-administered medications for this encounter.    No Known Allergies  Social History   Social History  . Marital Status: Married    Spouse Name: N/A  . Number of Children: N/A  . Years of Education: N/A   Occupational History  . Not on file.   Social History Main Topics  . Smoking status: Never Smoker   . Smokeless tobacco: Never Used  . Alcohol Use: Yes     Comment: 3  . Drug Use: No  . Sexual Activity: Not Currently   Other Topics Concern  . Not on file   Social History Narrative    Family History  Problem Relation Age of Onset  . Emphysema Mother   . Emphysema Father     ROS- All systems are reviewed and negative except as per the HPI above  Physical Exam: Filed Vitals:   01/04/16 0901  BP: 110/70  Pulse: 60  Height: 5\' 10"  (1.778 m)  Weight: 162 lb (73.483 kg)  GEN- The patient is well appearing, alert and oriented x 3 today.   Head- normocephalic, atraumatic Eyes-  Sclera clear, conjunctiva pink Ears- hearing intact Oropharynx- clear Neck- supple, no JVP Lymph- no cervical lymphadenopathy Lungs- Clear to ausculation bilaterally, normal work of breathing Heart- Regular rate and rhythm, no murmurs, rubs or gallops, PMI not laterally displaced GI- soft, NT, ND, + BS Extremities- no clubbing, cyanosis, or edema MS- no significant deformity or atrophy Skin- no rash or lesion Psych- euthymic mood, full affect Neuro- strength and sensation are intact  EKG- NSR, pr int 162 ms, qrs int 82 ms, qtc 460 ms(stable) Epic records reviewed  Assessment and Plan: 1. Afib On tikosyn and maintaining SR Continue 500 mg bid, drug  precautions reviewed Continue eliquis Bmet/cbc today  Butch Penny C. Samanatha Brammer, [facility] [address] [place], [place] [address] [phone]

## 2016-01-09 ENCOUNTER — Telehealth: Payer: Self-pay | Admitting: Interventional Cardiology

## 2016-01-09 NOTE — Telephone Encounter (Signed)
Isaac [name] is calling because he is needing to have prior auth for his Tikosyn, he is getting the generic version . [employer] is asking for the auth.  Thanks

## 2016-01-11 ENCOUNTER — Telehealth: Payer: Self-pay

## 2016-01-11 ENCOUNTER — Ambulatory Visit (HOSPITAL_COMMUNITY)
Admission: RE | Admit: 2016-01-11 | Discharge: 2016-01-11 | Disposition: A | Discharge status: Home / Self Care | Payer: Medicare Other | Source: Ambulatory Visit | Attending: Nurse Practitioner | Admitting: Nurse Practitioner

## 2016-01-11 ENCOUNTER — Other Ambulatory Visit (HOSPITAL_COMMUNITY): Payer: Self-pay | Admitting: *Deleted

## 2016-01-11 DIAGNOSIS — I480 Paroxysmal atrial fibrillation: Secondary | ICD-10-CM | POA: Insufficient documentation

## 2016-01-11 LAB — BASIC METABOLIC PANEL
ANION GAP: 7 (ref 5–15)
BUN: 8 mg/dL (ref 6–20)
CO2: 24 mmol/L (ref 22–32)
Calcium: 9.2 mg/dL (ref 8.9–10.3)
Chloride: 112 mmol/L — ABNORMAL HIGH (ref 101–111)
Creatinine, Ser: 1.07 mg/dL (ref 0.61–1.24)
GFR calc Af Amer: >60 mL/min (ref >60)
GLUCOSE: 97 mg/dL (ref 65–99)
POTASSIUM: 3.9 mmol/L (ref 3.5–5.1)
Sodium: 143 mmol/L (ref 135–145)

## 2016-01-11 MED ORDER — POTASSIUM CHLORIDE CRYS ER 20 MEQ PO TBCR: 20.0000 meq | EXTENDED_RELEASE_TABLET | Freq: Every day | ORAL | Status: DC

## 2016-01-11 MED FILL — POTASSIUM CL ER 20 MEQ TAB: 20 | 30 days supply | Qty: 30 | Fill #0

## 2016-01-11 NOTE — Telephone Encounter (Signed)
Request for prior auth of Tikosyn sent to [facility]. Patient notified.

## 2016-01-11 NOTE — Telephone Encounter (Signed)
Prior auth for Tikosyn 500 mcg sent to [facility].

## 2016-01-12 ENCOUNTER — Telehealth: Payer: Self-pay

## 2016-01-12 NOTE — Telephone Encounter (Signed)
Dofetilide is on patient's list of covered drugs. I have left him a message to this effect.

## 2016-01-15 ENCOUNTER — Other Ambulatory Visit: Payer: Self-pay | Admitting: Interventional Cardiology

## 2016-01-15 MED FILL — ROSUVASTATIN CALCIUM 5 MG T: 5 | 30 days supply | Qty: 30 | Fill #0

## 2016-01-17 ENCOUNTER — Ambulatory Visit (HOSPITAL_COMMUNITY)
Admission: RE | Admit: 2016-01-17 | Discharge: 2016-01-17 | Disposition: A | Discharge status: Home / Self Care | Payer: Medicare Other | Source: Ambulatory Visit | Attending: Nurse Practitioner | Admitting: Nurse Practitioner

## 2016-01-17 DIAGNOSIS — I480 Paroxysmal atrial fibrillation: Secondary | ICD-10-CM | POA: Diagnosis present

## 2016-01-17 LAB — BASIC METABOLIC PANEL
Anion gap: 7 (ref 5–15)
BUN: 14 mg/dL (ref 6–20)
CHLORIDE: 111 mmol/L (ref 101–111)
CO2: 25 mmol/L (ref 22–32)
Calcium: 9.6 mg/dL (ref 8.9–10.3)
Creatinine, Ser: 1.16 mg/dL (ref 0.61–1.24)
GFR calc Af Amer: >60 mL/min (ref >60)
GFR calc non Af Amer: >60 mL/min (ref >60)
GLUCOSE: 91 mg/dL (ref 65–99)
POTASSIUM: 4.1 mmol/L (ref 3.5–5.1)
Sodium: 143 mmol/L (ref 135–145)

## 2016-01-25 ENCOUNTER — Ambulatory Visit (HOSPITAL_COMMUNITY): Payer: Medicare Other | Admitting: Nurse Practitioner

## 2016-01-25 ENCOUNTER — Ambulatory Visit: Payer: Medicare Other | Admitting: Internal Medicine

## 2016-01-26 ENCOUNTER — Ambulatory Visit (HOSPITAL_COMMUNITY): Payer: Medicare Other | Admitting: Nurse Practitioner

## 2016-01-29 ENCOUNTER — Ambulatory Visit (INDEPENDENT_AMBULATORY_CARE_PROVIDER_SITE_OTHER): Payer: Medicare Other | Admitting: Internal Medicine

## 2016-01-29 ENCOUNTER — Encounter: Payer: Self-pay | Admitting: Internal Medicine

## 2016-01-29 VITALS — BP 102/66 | HR 57 | Ht 70.0 in | Wt 163.0 lb

## 2016-01-29 DIAGNOSIS — I2510 Atherosclerotic heart disease of native coronary artery without angina pectoris: Secondary | ICD-10-CM | POA: Diagnosis not present

## 2016-01-29 DIAGNOSIS — I480 Paroxysmal atrial fibrillation: Secondary | ICD-10-CM | POA: Diagnosis not present

## 2016-01-29 MED FILL — ELIQUIS 5 MG TABLET: 5 | 30 days supply | Qty: 60 | Fill #1

## 2016-01-29 NOTE — Patient Instructions (Signed)
Medication Instructions:  Your physician has recommended you make the following change in your medication:  1) Stop Aspirin   Labwork: None ordered   Testing/Procedures: None ordered   Follow-Up: Your physician recommends that you schedule a follow-up appointment in: 3 months with Dr Rayann Heman   Any Other Special Instructions Will Be Listed Below (If Applicable).     If you need a refill on your cardiac medications before your next appointment, please call your pharmacy.

## 2016-01-29 NOTE — Progress Notes (Signed)
Electrophysiology Office Note   Date:  01/29/2016   ID:  Isaac [name] 12-Jul-1951, MRN [id number]  PCP:  Henrine Screws, MD  Cardiologist:  Dr Tamala Julian Primary Electrophysiologist: Thompson Grayer, MD    Chief Complaint  Patient presents with  . Hospitalization Follow-up    pt states he feels ok      History of Present Illness: Isaac [name] is a 65 y.o. male who presents today for electrophysiology evaluation.   Doing well since recent initiation of tikosyn.  Continues to have atrial fibrillation.  Episodes are less frequent and now last mostly only several hours at a time.  He estimates that he was in AF at least some duration for 15 of the past 30 days.  Thinks caffeine is a trigger but not ETOH.  Today, he denies symptoms of chest pain, shortness of breath, orthopnea, PND, lower extremity edema, claudication, dizziness, presyncope, syncope, bleeding, or neurologic sequela. The patient is tolerating medications without difficulties and is otherwise without complaint today.    Past Medical History  Diagnosis Date  . Myocardial infarction ([place])   . Coronary artery disease   . Anginal pain ([place])   . Paroxysmal atrial fibrillation ([facility])   . Chronic systolic (congestive) heart failure ([facility])     EF 40%  . S/P CABG (coronary artery bypass graft)   . METATARSALGIA   . CERVICAL SPINE DISORDER, NOS   . 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 OSTEOARTHROSIS, LOCAL, SCND, HAND    Past Surgical History  Procedure Laterality Date  . Cholecystectomy    . Appendectomy    . Coronary artery bypass graft  10/2009     Current Outpatient Prescriptions  Medication Sig Dispense Refill  . cetirizine (ZYRTEC) 10 MG tablet Take 10 mg by mouth daily.    . cholecalciferol (VITAMIN D) 400 units TABS tablet Take 1,200 Units by mouth daily.     Marland Kitchen dofetilide (TIKOSYN) 500 MCG capsule Take 1 capsule (500 mcg total) by mouth 2 (two) times daily. 60 capsule 6  . ELIQUIS 5 MG TABS tablet  TAKE 1 TABLET BY MOUTH 2 TIMES DAILY. 180 tablet 0  . metoprolol tartrate (LOPRESSOR) 25 MG tablet Take 0.5 tablets (12.5 mg total) by mouth 2 (two) times daily. 30 tablet 11  . nitroGLYCERIN (NITROSTAT) 0.4 MG SL tablet Place 0.4 mg under the tongue every 5 (five) minutes as needed for chest pain.    . potassium chloride SA (K-DUR,KLOR-CON) 20 MEQ tablet Take 1 tablet (20 mEq total) by mouth daily. 30 tablet 6  . rosuvastatin (CRESTOR) 5 MG tablet TAKE 1 TABLET BY MOUTH DAILY. 30 tablet 10  . Tamsulosin HCl (FLOMAX) 0.4 MG CAPS Take 0.4 mg by mouth daily after supper.     No current facility-administered medications for this visit.    Allergies:   Review of patient's allergies indicates no known allergies.   Social History:  The patient  reports that he has never smoked. He has never used smokeless tobacco. He reports that he drinks alcohol. He reports that he does not use illicit drugs.   Family History:  The patient's  family history includes Emphysema in his father and mother.    ROS:  Please see the history of present illness.   All other systems are reviewed and negative.    PHYSICAL EXAM: VS:  BP 102/66 mmHg  Pulse 57  Ht 5\' 10"  (1.778 m)  Wt 163 lb (73.936 kg)  BMI 23.39 kg/m2 ,  BMI Body mass index is 23.39 kg/(m^2). GEN: Well nourished, well developed, in no acute distress HEENT: normal Neck: no JVD, carotid bruits, or masses Cardiac: RRR; no murmurs, rubs, or gallops,no edema  Respiratory:  clear to auscultation bilaterally, normal work of breathing GI: soft, nontender, nondistended, + BS MS: no deformity or atrophy Skin: warm and dry  Neuro:  Strength and sensation are intact Psych: euthymic mood, full affect  EKG:  EKG is ordered today. The ekg ordered today shows sinus rhythm, LAA, QTc 455 msec, septal infarct   Recent Labs: 01/04/2016: Magnesium 2.2 01/17/2016: BUN 14; Creatinine, Ser 1.16; Potassium 4.1; Sodium 143    Lipid Panel     Component Value  Date/Time   CHOL 114 05/22/2012 0221   TRIG 105 05/22/2012 0221   HDL 39* 05/22/2012 0221   CHOLHDL 2.9 05/22/2012 0221   VLDL 21 05/22/2012 0221   LDLCALC 54 05/22/2012 0221     Wt Readings from Last 3 Encounters:  01/29/16 163 lb (73.936 kg)  01/04/16 162 lb (73.483 kg)  12/29/15 157 lb 1.6 oz (71.26 kg)      Other studies Reviewed: Additional studies/ records that were reviewed today include: AF clinic notes    ASSESSMENT AND PLAN:  1.  Paroxysmal atrial fibrillation Improved but continues despite medical therapy with tikosyn. Therapeutic strategies for afib including medicine (amiodarone) and ablation were discussed in detail with the patient today. Risk, benefits, and alternatives to EP study and radiofrequency ablation for afib were also discussed in detail today.  At this time, he would prefer to continue with tikosyn.  If his af burden increases then he may be more willing to consider ablation  chads2vasc score is at least 2.  Continue eliquis.  Stop asa  2. CAD No ischemic symptoms No changes today  3. Chronic systolic dysfunction Possibly part tachy mediated Would repeat echo once sinus rhythm is established and maintainined   Follow-up:  Return to see me in 3 months Follow-up with Dr Tamala Julian as scheduled  Current medicines are reviewed at length with the patient today.   The patient does not have concerns regarding his medicines.  The following changes were made today:  none  Labs/ tests ordered today include:  Orders Placed This Encounter  Procedures  . EKG 12-Lead     Signed, Thompson Grayer, MD  01/29/2016 9:21 AM     [place] [address] [phone] (office) [phone] (fax)

## 2016-01-30 MED FILL — *DOFETILIDE 500 MCG CAPSULE: 500 | 30 days supply | Qty: 60 | Fill #1

## 2016-01-30 MED FILL — METOPROLOL TARTRATE 25 MG T: 25 | 30 days supply | Qty: 30 | Fill #1

## 2016-02-06 MED FILL — POTASSIUM CL ER 20 MEQ TAB: 20 | 30 days supply | Qty: 30 | Fill #1

## 2016-02-14 MED FILL — ROSUVASTATIN CALCIUM 5 MG T: 5 | 30 days supply | Qty: 30 | Fill #1

## 2016-02-26 MED FILL — ELIQUIS 5 MG TABLET: 5 | 30 days supply | Qty: 60 | Fill #2

## 2016-02-26 MED FILL — TAMSULOSIN HCL 0.4 MG CAP: 0.4 | 90 days supply | Qty: 90 | Fill #0

## 2016-02-28 MED FILL — METOPROLOL TARTRATE 25 MG T: 25 | 30 days supply | Qty: 30 | Fill #2

## 2016-02-28 MED FILL — *DOFETILIDE 500 MCG CAPSULE: 500 | 30 days supply | Qty: 60 | Fill #2

## 2016-03-06 ENCOUNTER — Telehealth (HOSPITAL_COMMUNITY): Payer: Self-pay | Admitting: *Deleted

## 2016-03-06 MED FILL — KLOR-CON M20 TABLET: 20 | 30 days supply | Qty: 30 | Fill #2

## 2016-03-06 NOTE — Telephone Encounter (Signed)
Pt called in stating had his blood work completed at PCP office earlier this week for tikosyn and potassium was 3.9 -- states he had cold the week of the blood work so didn't eat/drink as much as he normally does. Instructed him to have potassium redrawn in 2 weeks and if still not above 4.0 may have to increase daily potassium supplement or increase potassium in diet. Patient will inform after repeat bloodwork if needs addressing.

## 2016-03-11 ENCOUNTER — Encounter: Payer: Self-pay | Admitting: Nurse Practitioner

## 2016-03-15 MED FILL — ROSUVASTATIN CALCIUM 5 MG T: 5 | 30 days supply | Qty: 30 | Fill #2

## 2016-03-18 ENCOUNTER — Ambulatory Visit (HOSPITAL_COMMUNITY)
Admission: RE | Admit: 2016-03-18 | Discharge: 2016-03-18 | Disposition: A | Discharge status: Home / Self Care | Payer: Medicare Other | Source: Ambulatory Visit | Attending: Nurse Practitioner | Admitting: Nurse Practitioner

## 2016-03-18 DIAGNOSIS — I480 Paroxysmal atrial fibrillation: Secondary | ICD-10-CM | POA: Diagnosis not present

## 2016-03-18 LAB — BASIC METABOLIC PANEL
Anion gap: 10 (ref 5–15)
BUN: 12 mg/dL (ref 6–20)
CHLORIDE: 110 mmol/L (ref 101–111)
CO2: 22 mmol/L (ref 22–32)
CREATININE: 1.14 mg/dL (ref 0.61–1.24)
Calcium: 9.4 mg/dL (ref 8.9–10.3)
Glucose, Bld: 115 mg/dL — ABNORMAL HIGH (ref 65–99)
Potassium: 4 mmol/L (ref 3.5–5.1)
SODIUM: 142 mmol/L (ref 135–145)

## 2016-03-18 LAB — MAGNESIUM: MAGNESIUM: 2 mg/dL (ref 1.7–2.4)

## 2016-03-26 ENCOUNTER — Other Ambulatory Visit: Payer: Self-pay | Admitting: Interventional Cardiology

## 2016-03-27 MED FILL — ELIQUIS 5 MG TABLET: 5 | 90 days supply | Qty: 180 | Fill #0

## 2016-04-01 MED FILL — *DOFETILIDE 500 MCG CAPSULE: 500 | 30 days supply | Qty: 60 | Fill #3

## 2016-04-01 MED FILL — METOPROLOL TARTRATE 25 MG T: 25 | 30 days supply | Qty: 30 | Fill #3

## 2016-04-05 MED FILL — KLOR-CON M20 TABLET: 20 | 30 days supply | Qty: 30 | Fill #3

## 2016-04-15 MED FILL — ROSUVASTATIN CALCIUM 5 MG T: 5 | 30 days supply | Qty: 30 | Fill #3

## 2016-04-29 MED FILL — *DOFETILIDE 500 MCG CAPSULE: 500 | 30 days supply | Qty: 60 | Fill #4

## 2016-05-01 ENCOUNTER — Encounter: Payer: Self-pay | Admitting: Internal Medicine

## 2016-05-01 ENCOUNTER — Ambulatory Visit (INDEPENDENT_AMBULATORY_CARE_PROVIDER_SITE_OTHER): Payer: Medicare Other | Admitting: Internal Medicine

## 2016-05-01 VITALS — BP 102/70 | HR 55 | Ht 70.0 in | Wt 159.2 lb

## 2016-05-01 DIAGNOSIS — I5042 Chronic combined systolic (congestive) and diastolic (congestive) heart failure: Secondary | ICD-10-CM

## 2016-05-01 DIAGNOSIS — I2510 Atherosclerotic heart disease of native coronary artery without angina pectoris: Secondary | ICD-10-CM

## 2016-05-01 DIAGNOSIS — I480 Paroxysmal atrial fibrillation: Secondary | ICD-10-CM

## 2016-05-01 MED FILL — METOPROLOL TARTRATE 25 MG T: 25 | 30 days supply | Qty: 30 | Fill #4

## 2016-05-01 NOTE — Progress Notes (Signed)
Electrophysiology Office Note   Date:  05/01/2016   ID:  Isaac, [name] Mar 29, 1951, MRN [id number]  PCP:  Henrine Screws, MD  Cardiologist:  Dr Tamala Julian Primary Electrophysiologist: Thompson Grayer, MD    Chief Complaint  Patient presents with  . Atrial Fibrillation     History of Present Illness: Isaac [name] is a 65 y.o. male who presents today for electrophysiology evaluation.   He has occasional afib, about 1-2 times per week, lasting 2-3 hours.   Today, he denies symptoms of chest pain, shortness of breath, orthopnea, PND, lower extremity edema, claudication, dizziness, presyncope, syncope, bleeding, or neurologic sequela. The patient is tolerating medications without difficulties and is otherwise without complaint today.    Past Medical History  Diagnosis Date  . Coronary artery disease     a. s/p CABG  . Paroxysmal atrial fibrillation (HCC)   . Chronic systolic (congestive) heart failure (HCC)     EF 40%  . METATARSALGIA   . CERVICAL SPINE DISORDER, NOS   . 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 OSTEOARTHROSIS, LOCAL, SCND, HAND    Past Surgical History  Procedure Laterality Date  . Cholecystectomy    . Appendectomy    . Coronary artery bypass graft  10/2009     Current Outpatient Prescriptions  Medication Sig Dispense Refill  . cetirizine (ZYRTEC) 10 MG tablet Take 10 mg by mouth daily.    . cholecalciferol (VITAMIN D) 400 units TABS tablet Take 1,200 Units by mouth daily.     Marland Kitchen dofetilide (TIKOSYN) 500 MCG capsule Take 1 capsule (500 mcg total) by mouth 2 (two) times daily. 60 capsule 6  . ELIQUIS 5 MG TABS tablet TAKE 1 TABLET BY MOUTH 2 TIMES DAILY. 180 tablet 2  . metoprolol tartrate (LOPRESSOR) 25 MG tablet Take 0.5 tablets (12.5 mg total) by mouth 2 (two) times daily. 30 tablet 11  . nitroGLYCERIN (NITROSTAT) 0.4 MG SL tablet Place 0.4 mg under the tongue every 5 (five) minutes as needed for chest pain.    . potassium chloride SA  (K-DUR,KLOR-CON) 20 MEQ tablet Take 1 tablet (20 mEq total) by mouth daily. 30 tablet 6  . rosuvastatin (CRESTOR) 5 MG tablet TAKE 1 TABLET BY MOUTH DAILY. 30 tablet 10  . Tamsulosin HCl (FLOMAX) 0.4 MG CAPS Take 0.4 mg by mouth daily after supper.     No current facility-administered medications for this visit.    Allergies:   Review of patient's allergies indicates no known allergies.   Social History:  The patient  reports that he has never smoked. He has never used smokeless tobacco. He reports that he drinks alcohol. He reports that he does not use illicit drugs.   Family History:  The patient's  family history includes Emphysema in his father and mother.    ROS:  Please see the history of present illness.   All other systems are reviewed and negative.    PHYSICAL EXAM: VS:  BP 102/70 mmHg  Pulse 55  Ht 5\' 10"  (1.778 m)  Wt 159 lb 3.2 oz (72.213 kg)  BMI 22.84 kg/m2 , BMI Body mass index is 22.84 kg/(m^2). GEN: Well nourished, well developed, in no acute distress HEENT: normal Neck: no JVD, carotid bruits, or masses Cardiac: RRR; no murmurs, rubs, or gallops,no edema  Respiratory:  clear to auscultation bilaterally, normal work of breathing GI: soft, nontender, nondistended, + BS MS: no deformity or atrophy Skin: warm and dry  Neuro:  Strength and sensation  are intact Psych: euthymic mood, full affect  EKG:  EKG is ordered today. The ekg ordered today shows sinus rhythm 55 bpm, LAA, QTc 46msec, septal infarct   Recent Labs: 03/18/2016: BUN 12; Creatinine, Ser 1.14; Magnesium 2.0; Potassium 4.0; Sodium 142    Lipid Panel     Component Value Date/Time   CHOL 114 05/22/2012 0221   TRIG 105 05/22/2012 0221   HDL 39* 05/22/2012 0221   CHOLHDL 2.9 05/22/2012 0221   VLDL 21 05/22/2012 0221   LDLCALC 54 05/22/2012 0221     Wt Readings from Last 3 Encounters:  05/01/16 159 lb 3.2 oz (72.213 kg)  01/29/16 163 lb (73.936 kg)  01/04/16 162 lb (73.483 kg)      ASSESSMENT AND PLAN:  1.  Paroxysmal atrial fibrillation Improved but continues despite medical therapy with tikosyn. Therapeutic strategies for afib including medicine (amiodarone) and ablation were discussed in detail with the patient today. Risk, benefits, and alternatives to EP study and radiofrequency ablation for afib were also discussed in detail today.  At this time, he would prefer to continue with tikosyn.  He is going to [place] in August.  He may be ready to consider ablation when he returns chads2vasc score is at least 2.  Continue eliquis.     2. CAD No ischemic symptoms No changes today  3. Chronic systolic dysfunction Possibly part tachy mediated Repeat echo prior to next visit   Follow-up:  Return to see me in 3 months Follow-up with Dr Tamala Julian as scheduled  Current medicines are reviewed at length with the patient today.   The patient does not have concerns regarding his medicines.  The following changes were made today:  none  Labs/ tests ordered today include:  No orders of the defined types were placed in this encounter.     Army Fossa, MD  05/01/2016 10:00 AM     [facility] [place] [address] [place] [address] [phone] (office) [phone] (fax)

## 2016-05-01 NOTE — Patient Instructions (Signed)
Medication Instructions:  Your physician recommends that you continue on your current medications as directed. Please refer to the Current Medication list given to you today.   Labwork: None ordered   Testing/Procedures: Your physician has requested that you have an echocardiogram. Echocardiography is a painless test that uses sound waves to create images of your heart. It provides your doctor with information about the size and shape of your heart and how well your heart's chambers and valves are working. This procedure takes approximately one hour. There are no restrictions for this procedure.    Follow-Up: Your physician recommends that you schedule a follow-up appointment in: 3 months with Dr Rayann Heman   Any Other Special Instructions Will Be Listed Below (If Applicable).     If you need a refill on your cardiac medications before your next appointment, please call your pharmacy.

## 2016-05-06 MED FILL — KLOR-CON M20 TABLET: 20 | 30 days supply | Qty: 30 | Fill #4

## 2016-05-13 MED FILL — ROSUVASTATIN CALCIUM 5 MG T: 5 | 30 days supply | Qty: 30 | Fill #4

## 2016-05-23 MED FILL — TAMSULOSIN HCL 0.4 MG CAP: 0.4 | 90 days supply | Qty: 90 | Fill #1

## 2016-05-24 ENCOUNTER — Other Ambulatory Visit (HOSPITAL_COMMUNITY): Payer: Self-pay | Admitting: *Deleted

## 2016-05-24 DIAGNOSIS — I4891 Unspecified atrial fibrillation: Secondary | ICD-10-CM

## 2016-05-24 MED ORDER — POTASSIUM CHLORIDE CRYS ER 20 MEQ PO TBCR: 20.0000 meq | EXTENDED_RELEASE_TABLET | Freq: Every day | ORAL | Status: DC

## 2016-05-27 ENCOUNTER — Other Ambulatory Visit: Payer: Self-pay

## 2016-05-27 MED ORDER — APIXABAN 5 MG PO TABS: ORAL_TABLET | ORAL | Status: DC

## 2016-05-27 MED ORDER — METOPROLOL TARTRATE 25 MG PO TABS: 12.5000 mg | ORAL_TABLET | Freq: Two times a day (BID) | ORAL | Status: DC

## 2016-05-27 MED ORDER — ROSUVASTATIN CALCIUM 5 MG PO TABS: 5.0000 mg | ORAL_TABLET | Freq: Every day | ORAL | Status: DC

## 2016-05-27 MED FILL — METOPROLOL TARTRATE 25 MG T: 25 | 90 days supply | Qty: 90 | Fill #0

## 2016-05-29 ENCOUNTER — Other Ambulatory Visit: Payer: Self-pay | Admitting: *Deleted

## 2016-05-29 MED ORDER — DOFETILIDE 500 MCG PO CAPS: 500.0000 ug | ORAL_CAPSULE | Freq: Two times a day (BID) | ORAL | Status: DC

## 2016-05-29 MED FILL — DOFETILIDE 500 MCG CAPSULE: 500 | 90 days supply | Qty: 180 | Fill #0

## 2016-05-29 MED FILL — KLOR-CON M20 TABLET: 20 | 90 days supply | Qty: 90 | Fill #0

## 2016-06-05 MED FILL — ELIQUIS 5 MG TABLET: 5 | 90 days supply | Qty: 180 | Fill #0

## 2016-06-05 MED FILL — ROSUVASTATIN CALCIUM 5 MG T: 5 | 90 days supply | Qty: 90 | Fill #0

## 2016-07-24 ENCOUNTER — Ambulatory Visit (HOSPITAL_COMMUNITY): Payer: Medicare Other | Attending: Cardiovascular Disease

## 2016-07-24 ENCOUNTER — Other Ambulatory Visit: Payer: Self-pay

## 2016-07-24 DIAGNOSIS — I517 Cardiomegaly: Secondary | ICD-10-CM | POA: Insufficient documentation

## 2016-07-24 DIAGNOSIS — I509 Heart failure, unspecified: Secondary | ICD-10-CM | POA: Insufficient documentation

## 2016-07-24 DIAGNOSIS — I252 Old myocardial infarction: Secondary | ICD-10-CM | POA: Diagnosis not present

## 2016-07-24 DIAGNOSIS — I4891 Unspecified atrial fibrillation: Secondary | ICD-10-CM | POA: Diagnosis present

## 2016-07-24 DIAGNOSIS — I2510 Atherosclerotic heart disease of native coronary artery without angina pectoris: Secondary | ICD-10-CM | POA: Insufficient documentation

## 2016-07-24 DIAGNOSIS — I480 Paroxysmal atrial fibrillation: Secondary | ICD-10-CM | POA: Insufficient documentation

## 2016-07-31 ENCOUNTER — Ambulatory Visit (INDEPENDENT_AMBULATORY_CARE_PROVIDER_SITE_OTHER): Payer: Medicare Other | Admitting: Internal Medicine

## 2016-07-31 ENCOUNTER — Encounter: Payer: Self-pay | Admitting: Internal Medicine

## 2016-07-31 VITALS — BP 110/76 | HR 55 | Ht 70.0 in | Wt 155.8 lb

## 2016-07-31 DIAGNOSIS — I480 Paroxysmal atrial fibrillation: Secondary | ICD-10-CM

## 2016-07-31 LAB — BASIC METABOLIC PANEL
BUN: 14 mg/dL (ref 7–25)
CALCIUM: 9.4 mg/dL (ref 8.6–10.3)
CO2: 24 mmol/L (ref 20–31)
Chloride: 111 mmol/L — ABNORMAL HIGH (ref 98–110)
Creat: 0.95 mg/dL (ref 0.70–1.25)
GLUCOSE: 92 mg/dL (ref 65–99)
Potassium: 4.3 mmol/L (ref 3.5–5.3)
SODIUM: 143 mmol/L (ref 135–146)

## 2016-07-31 LAB — MAGNESIUM: MAGNESIUM: 2.2 mg/dL (ref 1.5–2.5)

## 2016-07-31 MED ORDER — LOSARTAN POTASSIUM 25 MG PO TABS: 25.0000 mg | ORAL_TABLET | Freq: Every day | ORAL | 3 refills | Status: DC

## 2016-07-31 MED FILL — LOSARTAN POTASSIUM 25 MG TA: 25 | 90 days supply | Qty: 90 | Fill #0

## 2016-07-31 NOTE — Progress Notes (Signed)
Electrophysiology Office Note   Date:  07/31/2016   ID:  Isaac, [name] 1951-10-13, MRN [id number]  PCP:  Henrine Screws, MD  Cardiologist:  Dr Tamala Julian Primary Electrophysiologist: Thompson Grayer, MD    Chief Complaint  Patient presents with  . Atrial Fibrillation     History of Present Illness: Isaac [name] is a 65 y.o. male who presents today for electrophysiology evaluation.   He still has occasional afib, about 1-2 times per week, lasting 2-3 hours.   Today, he denies symptoms of chest pain, shortness of breath, orthopnea, PND, lower extremity edema, claudication, dizziness, presyncope, syncope, bleeding, or neurologic sequela. The patient is tolerating medications without difficulties and is otherwise without complaint today.    Past Medical History:  Diagnosis Date  . CERVICAL SPINE DISORDER, NOS   . Chronic systolic (congestive) heart failure (HCC)    EF 40%  . Coronary artery disease    a. s/p CABG  . 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 METATARSALGIA   . OSTEOARTHROSIS, LOCAL, SCND, HAND   . Paroxysmal atrial fibrillation [facility])    Past Surgical History:  Procedure Laterality Date  . APPENDECTOMY    . CHOLECYSTECTOMY    . CORONARY ARTERY BYPASS GRAFT  10/2009     Current Outpatient Prescriptions  Medication Sig Dispense Refill  . apixaban (ELIQUIS) 5 MG TABS tablet TAKE 1 TABLET BY MOUTH 2 TIMES DAILY. 180 tablet 1  . cetirizine (ZYRTEC) 10 MG tablet Take 10 mg by mouth daily.    . cholecalciferol (VITAMIN D) 400 units TABS tablet Take 1,200 Units by mouth daily.     Marland Kitchen dofetilide (TIKOSYN) 500 MCG capsule Take 1 capsule (500 mcg total) by mouth 2 (two) times daily. 90 capsule 3  . metoprolol tartrate (LOPRESSOR) 25 MG tablet Take 0.5 tablets (12.5 mg total) by mouth 2 (two) times daily. 90 tablet 1  . nitroGLYCERIN (NITROSTAT) 0.4 MG SL tablet Place 0.4 mg under the tongue every 5 (five) minutes as needed for chest pain.    . potassium chloride SA  (K-DUR,KLOR-CON) 20 MEQ tablet Take 1 tablet (20 mEq total) by mouth daily. 90 tablet 1  . rosuvastatin (CRESTOR) 5 MG tablet Take 1 tablet (5 mg total) by mouth daily. 90 tablet 1  . Tamsulosin HCl (FLOMAX) 0.4 MG CAPS Take 0.4 mg by mouth daily after supper.     No current facility-administered medications for this visit.     Allergies:   Review of patient's allergies indicates no known allergies.   Social History:  The patient  reports that he has never smoked. He has never used smokeless tobacco. He reports that he drinks alcohol. He reports that he does not use drugs.   Family History:  The patient's  family history includes Emphysema in his father and mother.    ROS:  Please see the history of present illness.   All other systems are reviewed and negative.    PHYSICAL EXAM: VS:  BP 110/76   Pulse (!) 55   Ht 5\' 10"  (1.778 m)   Wt 155 lb 12.8 oz (70.7 kg)   BMI 22.35 kg/m  , BMI Body mass index is 22.35 kg/m. GEN: Well nourished, well developed, in no acute distress  HEENT: normal  Neck: no JVD, carotid bruits, or masses Cardiac: RRR; no murmurs, rubs, or gallops,no edema  Respiratory:  clear to auscultation bilaterally, normal work of breathing GI: soft, nontender, nondistended, + BS MS: no deformity or atrophy  Skin: warm and dry  Neuro:  Strength and sensation are intact Psych: euthymic mood, full affect  EKG:  EKG is ordered today. The ekg ordered today shows sinus rhythm 55 bpm, LAA, QTc 443msec, septal infarct   Recent Labs: 03/18/2016: BUN 12; Creatinine, Ser 1.14; Magnesium 2.0; Potassium 4.0; Sodium 142    Lipid Panel     Component Value Date/Time   CHOL 114 05/22/2012 0221   TRIG 105 05/22/2012 0221   HDL 39 (L) 05/22/2012 0221   CHOLHDL 2.9 05/22/2012 0221   VLDL 21 05/22/2012 0221   LDLCALC 54 05/22/2012 0221     Wt Readings from Last 3 Encounters:  07/31/16 155 lb 12.8 oz (70.7 kg)  05/01/16 159 lb 3.2 oz (72.2 kg)  01/29/16 163 lb (73.9 kg)      ASSESSMENT AND PLAN:  1.  Paroxysmal atrial fibrillation continues despite medical therapy with tikosyn. Therapeutic strategies for afib including medicine and ablation were discussed in detail with the patient today. Risk, benefits, and alternatives to EP study and radiofrequency ablation for afib were also discussed in detail today. These risks include but are not limited to stroke, bleeding, vascular damage, tamponade, perforation, damage to the esophagus, lungs, and other structures, pulmonary vein stenosis, worsening renal function, and death. The patient understands these risk and wishes to think about this further.  If he decides to proceed, we would obtain cardiac CT to evaluate for LAA thrombus within 1 week of ablation. Bmet. Mg today  chads2vasc score is at least 2.  Continue eliquis.     2. CAD No ischemic symptoms No changes today  3. Chronic systolic dysfunction Repeat echo reveals EF 40% Add losartan 25mg  daily which may also be beneficial to promote atrial remodelling   Follow-up with Dr Tamala Julian as scheduled in January He will contact my office if he decides to proceed with ablation. Will need ekg, bmet, mg at least every 6 months going forward while on tikosyn.   Current medicines are reviewed at length with the patient today.   The patient does not have concerns regarding his medicines.  The following changes were made today:  none  Today, I have spent 40 minutes with the patient and spouse discussing afib ablation .  More than 50% of the visit time today was spent on this issue.   Army Fossa, MD  07/31/2016 10:53 AM     [facility] [address] [place] [address] [phone] (office) [phone] (fax)

## 2016-07-31 NOTE — Patient Instructions (Signed)
Medication Instructions:  Your physician has recommended you make the following change in your medication:  1) Start Losartan 25 mg daily   Labwork: Your physician recommends that you return for lab work today: BMP/Mag   Testing/Procedures: Your physician has recommended that you have an ablation. Catheter ablation is a medical procedure used to treat some cardiac arrhythmias (irregular heartbeats). During catheter ablation, a long, thin, flexible tube is put into a blood vessel in your groin (upper thigh), or neck. This tube is called an ablation catheter. It is then guided to your heart through the blood vessel. Radio frequency waves destroy small areas of heart tissue where abnormal heartbeats may cause an arrhythmia to start. Please see the instruction sheet given to you today.  Will need CT prior to ablation   Call if you decide to proceed  Follow-Up: Your physician recommends that you schedule a follow-up appointment in: January with Dr Tamala Julian   Any Other Special Instructions Will Be Listed Below (If Applicable).     If you need a refill on your cardiac medications before your next appointment, please call your pharmacy.

## 2016-08-09 ENCOUNTER — Telehealth: Payer: Self-pay | Admitting: Internal Medicine

## 2016-08-09 DIAGNOSIS — I480 Paroxysmal atrial fibrillation: Secondary | ICD-10-CM

## 2016-08-09 NOTE — Telephone Encounter (Signed)
Pt calling to schedule Ablation,  would  Like 09-17-16 if poss-pls call

## 2016-08-13 NOTE — Telephone Encounter (Signed)
Labs --10/24 Cardiac CT---week of 10/30 Afib Ablation 09/17/16  CARTO/ICE/ANES---done

## 2016-08-14 ENCOUNTER — Encounter: Payer: Self-pay | Admitting: Internal Medicine

## 2016-08-15 ENCOUNTER — Telehealth: Payer: Self-pay | Admitting: Internal Medicine

## 2016-08-15 NOTE — Telephone Encounter (Signed)
New message       Calling to ask Dr Rayann Heman to sign CT of heart order in epic

## 2016-08-20 MED FILL — TAMSULOSIN HCL 0.4 MG CAP: 0.4 | 90 days supply | Qty: 90 | Fill #2

## 2016-08-23 ENCOUNTER — Encounter (HOSPITAL_COMMUNITY): Payer: Self-pay | Admitting: Nurse Practitioner

## 2016-08-26 MED FILL — DOFETILIDE 500 MCG CAPSULE: 500 | 90 days supply | Qty: 180 | Fill #1

## 2016-08-26 MED FILL — METOPROLOL TARTRATE 25 MG T: 25 | 90 days supply | Qty: 90 | Fill #1

## 2016-09-02 MED FILL — KLOR-CON M20 TABLET: 20 | 90 days supply | Qty: 90 | Fill #1

## 2016-09-03 ENCOUNTER — Other Ambulatory Visit: Payer: Medicare Other | Admitting: *Deleted

## 2016-09-03 DIAGNOSIS — I480 Paroxysmal atrial fibrillation: Secondary | ICD-10-CM

## 2016-09-03 LAB — CBC WITH DIFFERENTIAL/PLATELET
BASOS ABS: 55 {cells}/uL (ref 0–200)
BASOS PCT: 1 %
EOS ABS: 220 {cells}/uL (ref 15–500)
Eosinophils Relative: 4 %
HEMATOCRIT: 45.1 % (ref 38.5–50.0)
HEMOGLOBIN: 15.1 g/dL (ref 13.2–17.1)
LYMPHS ABS: 1870 {cells}/uL (ref 850–3900)
Lymphocytes Relative: 34 %
MCH: 31.4 pg (ref 27.0–33.0)
MCHC: 33.5 g/dL (ref 32.0–36.0)
MCV: 93.8 fL (ref 80.0–100.0)
MONO ABS: 385 {cells}/uL (ref 200–950)
MPV: 10.5 fL (ref 7.5–12.5)
Monocytes Relative: 7 %
NEUTROS ABS: 2970 {cells}/uL (ref 1500–7800)
Neutrophils Relative %: 54 %
PLATELETS: 207 10*3/uL (ref 140–400)
RBC: 4.81 MIL/uL (ref 4.20–5.80)
RDW: 12.7 % (ref 11.0–15.0)
WBC: 5.5 10*3/uL (ref 3.8–10.8)

## 2016-09-03 LAB — BASIC METABOLIC PANEL
BUN: 15 mg/dL (ref 7–25)
CHLORIDE: 108 mmol/L (ref 98–110)
CO2: 26 mmol/L (ref 20–31)
Calcium: 9.5 mg/dL (ref 8.6–10.3)
Creat: 1.12 mg/dL (ref 0.70–1.25)
Glucose, Bld: 102 mg/dL — ABNORMAL HIGH (ref 65–99)
POTASSIUM: 4.1 mmol/L (ref 3.5–5.3)
Sodium: 142 mmol/L (ref 135–146)

## 2016-09-11 MED FILL — ROSUVASTATIN CALCIUM 5 MG T: 5 | 90 days supply | Qty: 90 | Fill #1

## 2016-09-12 ENCOUNTER — Telehealth: Payer: Self-pay | Admitting: Interventional Cardiology

## 2016-09-12 ENCOUNTER — Ambulatory Visit (HOSPITAL_COMMUNITY)
Admission: RE | Admit: 2016-09-12 | Discharge: 2016-09-12 | Disposition: A | Discharge status: Home / Self Care | Payer: Medicare Other | Source: Ambulatory Visit | Attending: Internal Medicine | Admitting: Internal Medicine

## 2016-09-12 ENCOUNTER — Encounter (HOSPITAL_COMMUNITY): Payer: Self-pay

## 2016-09-12 DIAGNOSIS — I480 Paroxysmal atrial fibrillation: Secondary | ICD-10-CM | POA: Diagnosis not present

## 2016-09-12 DIAGNOSIS — I4891 Unspecified atrial fibrillation: Secondary | ICD-10-CM

## 2016-09-12 MED ORDER — NITROGLYCERIN 0.4 MG SL SUBL
SUBLINGUAL_TABLET | SUBLINGUAL | Status: AC
  Filled 2016-09-12: qty 1

## 2016-09-12 MED ORDER — NITROGLYCERIN 0.4 MG SL SUBL
0.4000 mg | SUBLINGUAL_TABLET | SUBLINGUAL | Status: DC | PRN
  Administered 2016-09-12: 0.4 mg via SUBLINGUAL
  Filled 2016-09-12 (×2): qty 25

## 2016-09-12 MED ORDER — IOPAMIDOL (ISOVUE-370) INJECTION 76%
INTRAVENOUS | Status: AC
  Administered 2016-09-12: 80 mL
  Filled 2016-09-12: qty 100

## 2016-09-12 NOTE — Telephone Encounter (Signed)
Forwarding to [employer]

## 2016-09-12 NOTE — Telephone Encounter (Signed)
New Message  Pt call requesting to speak with RN about instructions of procedure. Please call back to discuss

## 2016-09-12 NOTE — Telephone Encounter (Signed)
Patient was just concerned as he was told not to take any medications the morning of the procedure and he wanted to make sure that was correct.  I let him know that was correct and we went over instructions again.  He was appreciative of my call

## 2016-09-12 NOTE — Progress Notes (Signed)
Pt discharged home. Feels 'fine" he says, VS stable, IV removed and hemostasis obtained.

## 2016-09-16 NOTE — Anesthesia Preprocedure Evaluation (Addendum)
Anesthesia Evaluation  Patient identified by MRN, date of birth, ID band Patient awake    Reviewed: Allergy & Precautions, NPO status , Patient's Chart, lab work & pertinent test results  History of Anesthesia Complications Negative for: history of anesthetic complications  Airway Mallampati: II  TM Distance: >3 FB Neck ROM: Full    Dental no notable dental hx. (+) Dental Advisory Given   Pulmonary neg pulmonary ROS,    Pulmonary exam normal        Cardiovascular hypertension, Pt. on home beta blockers + CAD, + Past MI and +CHF   Rhythm:Irregular Rate:Normal  Study Conclusions  - Left ventricle: Diffuse hypokinesis worse in the septum adn apex.   The cavity size was moderately dilated. Wall thickness was   normal. Systolic function was moderately reduced. The estimated   ejection fraction was in the range of 35% to 40%. Doppler   parameters are consistent with abnormal left ventricular   relaxation (grade 1 diastolic dysfunction).   Neuro/Psych negative neurological ROS  negative psych ROS   GI/Hepatic negative GI ROS, Neg liver ROS,   Endo/Other  negative endocrine ROS  Renal/GU negative Renal ROS     Musculoskeletal   Abdominal   Peds  Hematology negative hematology ROS (+)   Anesthesia Other Findings   Reproductive/Obstetrics                           Anesthesia Physical Anesthesia Plan  ASA: III  Anesthesia Plan: General   Post-op Pain Management:    Induction: Intravenous  Airway Management Planned: LMA  Additional Equipment: Arterial line  Intra-op Plan:   Post-operative Plan: Extubation in OR  Informed Consent: I have reviewed the patients History and Physical, chart, labs and discussed the procedure including the risks, benefits and alternatives for the proposed anesthesia with the patient or authorized representative who has indicated his/her understanding and  acceptance.   Dental advisory given  Plan Discussed with: CRNA and Anesthesiologist  Anesthesia Plan Comments:        Anesthesia Quick Evaluation

## 2016-09-17 ENCOUNTER — Ambulatory Visit (HOSPITAL_COMMUNITY): Payer: Medicare Other | Admitting: Anesthesiology

## 2016-09-17 ENCOUNTER — Encounter (HOSPITAL_COMMUNITY): Payer: Self-pay | Admitting: *Deleted

## 2016-09-17 ENCOUNTER — Ambulatory Visit (HOSPITAL_COMMUNITY)
Admission: RE | Admit: 2016-09-17 | Discharge: 2016-09-18 | Disposition: A | Discharge status: Home / Self Care | Payer: Medicare Other | Source: Ambulatory Visit | Attending: Internal Medicine | Admitting: Internal Medicine

## 2016-09-17 ENCOUNTER — Encounter (HOSPITAL_COMMUNITY)
Admission: RE | Disposition: A | Discharge status: Home / Self Care | Payer: Self-pay | Source: Ambulatory Visit | Attending: Internal Medicine

## 2016-09-17 DIAGNOSIS — I2510 Atherosclerotic heart disease of native coronary artery without angina pectoris: Secondary | ICD-10-CM | POA: Insufficient documentation

## 2016-09-17 DIAGNOSIS — I255 Ischemic cardiomyopathy: Secondary | ICD-10-CM | POA: Diagnosis not present

## 2016-09-17 DIAGNOSIS — Z79899 Other long term (current) drug therapy: Secondary | ICD-10-CM | POA: Diagnosis not present

## 2016-09-17 DIAGNOSIS — I4891 Unspecified atrial fibrillation: Secondary | ICD-10-CM | POA: Diagnosis present

## 2016-09-17 DIAGNOSIS — Z951 Presence of aortocoronary bypass graft: Secondary | ICD-10-CM | POA: Insufficient documentation

## 2016-09-17 DIAGNOSIS — I480 Paroxysmal atrial fibrillation: Secondary | ICD-10-CM | POA: Diagnosis not present

## 2016-09-17 DIAGNOSIS — I5022 Chronic systolic (congestive) heart failure: Secondary | ICD-10-CM | POA: Diagnosis not present

## 2016-09-17 DIAGNOSIS — Z7901 Long term (current) use of anticoagulants: Secondary | ICD-10-CM | POA: Diagnosis not present

## 2016-09-17 HISTORY — PX: ELECTROPHYSIOLOGIC STUDY: SHX172A

## 2016-09-17 LAB — POCT ACTIVATED CLOTTING TIME
ACTIVATED CLOTTING TIME: 274 s
ACTIVATED CLOTTING TIME: 318 s
Activated Clotting Time: 296 seconds
Activated Clotting Time: 175 seconds

## 2016-09-17 LAB — BASIC METABOLIC PANEL
Anion gap: 7 (ref 5–15)
BUN: 15 mg/dL (ref 6–20)
CALCIUM: 8.7 mg/dL — AB (ref 8.9–10.3)
CO2: 22 mmol/L (ref 22–32)
CREATININE: 1.06 mg/dL (ref 0.61–1.24)
Chloride: 114 mmol/L — ABNORMAL HIGH (ref 101–111)
GFR calc Af Amer: >60 mL/min (ref >60)
Glucose, Bld: 140 mg/dL — ABNORMAL HIGH (ref 65–99)
Potassium: 3.6 mmol/L (ref 3.5–5.1)
SODIUM: 143 mmol/L (ref 135–145)

## 2016-09-17 LAB — MAGNESIUM: MAGNESIUM: 1.8 mg/dL (ref 1.7–2.4)

## 2016-09-17 LAB — MRSA PCR SCREENING: MRSA BY PCR: NEGATIVE

## 2016-09-17 SURGERY — ATRIAL FIBRILLATION ABLATION
Anesthesia: General

## 2016-09-17 MED ORDER — BUPIVACAINE HCL (PF) 0.25 % IJ SOLN
INTRAMUSCULAR | Status: AC
  Filled 2016-09-17: qty 30

## 2016-09-17 MED ORDER — BUPIVACAINE HCL (PF) 0.25 % IJ SOLN
INTRAMUSCULAR | Status: DC | PRN
  Administered 2016-09-17: 20 mL

## 2016-09-17 MED ORDER — PHENYLEPHRINE HCL 10 MG/ML IJ SOLN
INTRAMUSCULAR | Status: DC | PRN
  Administered 2016-09-17: 40 ug via INTRAVENOUS
  Administered 2016-09-17: 80 ug via INTRAVENOUS

## 2016-09-17 MED ORDER — PHENYLEPHRINE HCL 10 MG/ML IJ SOLN
INTRAMUSCULAR | Status: DC | PRN
  Administered 2016-09-17: 100 ug/min via INTRAVENOUS

## 2016-09-17 MED ORDER — SODIUM CHLORIDE 0.9 % IV SOLN: Freq: Once | INTRAVENOUS | Status: DC

## 2016-09-17 MED ORDER — IOPAMIDOL (ISOVUE-370) INJECTION 76%
INTRAVENOUS | Status: AC
  Filled 2016-09-17: qty 50

## 2016-09-17 MED ORDER — HEPARIN SODIUM (PORCINE) 1000 UNIT/ML IJ SOLN
INTRAMUSCULAR | Status: AC
  Filled 2016-09-17: qty 1

## 2016-09-17 MED ORDER — METOPROLOL TARTRATE 12.5 MG HALF TABLET
12.5000 mg | ORAL_TABLET | Freq: Two times a day (BID) | ORAL | Status: DC
  Administered 2016-09-18: 12.5 mg via ORAL
  Filled 2016-09-17: qty 1

## 2016-09-17 MED ORDER — DEXTROSE 5 % IV SOLN
INTRAVENOUS | Status: DC | PRN
  Administered 2016-09-17: 20 ug/min via INTRAVENOUS

## 2016-09-17 MED ORDER — FENTANYL CITRATE (PF) 100 MCG/2ML IJ SOLN
INTRAMUSCULAR | Status: DC | PRN
  Administered 2016-09-17: 100 ug via INTRAVENOUS

## 2016-09-17 MED ORDER — APIXABAN 5 MG PO TABS
5.0000 mg | ORAL_TABLET | Freq: Two times a day (BID) | ORAL | Status: DC
  Administered 2016-09-17 – 2016-09-18 (×2): 5 mg via ORAL
  Filled 2016-09-17 (×2): qty 1

## 2016-09-17 MED ORDER — PROTAMINE SULFATE 10 MG/ML IV SOLN
INTRAVENOUS | Status: DC | PRN
  Administered 2016-09-17: 30 mg via INTRAVENOUS

## 2016-09-17 MED ORDER — TAMSULOSIN HCL 0.4 MG PO CAPS
0.4000 mg | ORAL_CAPSULE | Freq: Every day | ORAL | Status: DC
Start: 2016-09-17 — End: 2016-09-18
  Administered 2016-09-17: 0.4 mg via ORAL
  Filled 2016-09-17: qty 1

## 2016-09-17 MED ORDER — SODIUM CHLORIDE 0.9% FLUSH: 3.0000 mL | INTRAVENOUS | Status: DC | PRN

## 2016-09-17 MED ORDER — SODIUM CHLORIDE 0.9 % IV SOLN
INTRAVENOUS | Status: DC
  Administered 2016-09-17 (×2): via INTRAVENOUS

## 2016-09-17 MED ORDER — ALUM & MAG HYDROXIDE-SIMETH 200-200-20 MG/5ML PO SUSP
30.0000 mL | Freq: Four times a day (QID) | ORAL | Status: DC | PRN
  Administered 2016-09-17: 30 mL via ORAL
  Filled 2016-09-17: qty 30

## 2016-09-17 MED ORDER — SODIUM CHLORIDE 0.9% FLUSH
3.0000 mL | Freq: Two times a day (BID) | INTRAVENOUS | Status: DC
  Administered 2016-09-17 – 2016-09-18 (×3): 3 mL via INTRAVENOUS

## 2016-09-17 MED ORDER — MIDAZOLAM HCL 5 MG/5ML IJ SOLN
INTRAMUSCULAR | Status: DC | PRN
  Administered 2016-09-17: 2 mg via INTRAVENOUS

## 2016-09-17 MED ORDER — LOSARTAN POTASSIUM 25 MG PO TABS
25.0000 mg | ORAL_TABLET | Freq: Every day | ORAL | Status: DC
  Administered 2016-09-18: 25 mg via ORAL
  Filled 2016-09-17: qty 1

## 2016-09-17 MED ORDER — PROPOFOL 10 MG/ML IV BOLUS
INTRAVENOUS | Status: DC | PRN
  Administered 2016-09-17: 130 mg via INTRAVENOUS

## 2016-09-17 MED ORDER — ISOPROTERENOL HCL 0.2 MG/ML IJ SOLN
INTRAMUSCULAR | Status: AC
  Filled 2016-09-17: qty 5

## 2016-09-17 MED ORDER — ACETAMINOPHEN 325 MG PO TABS: 650.0000 mg | ORAL_TABLET | ORAL | Status: DC | PRN

## 2016-09-17 MED ORDER — HEPARIN SODIUM (PORCINE) 1000 UNIT/ML IJ SOLN
INTRAMUSCULAR | Status: DC | PRN
  Administered 2016-09-17: 2000 [IU] via INTRAVENOUS
  Administered 2016-09-17: 12000 [IU] via INTRAVENOUS
  Administered 2016-09-17: 3000 [IU] via INTRAVENOUS
  Administered 2016-09-17: 2000 [IU] via INTRAVENOUS

## 2016-09-17 MED ORDER — DOFETILIDE 500 MCG PO CAPS
500.0000 ug | ORAL_CAPSULE | Freq: Two times a day (BID) | ORAL | Status: DC
  Administered 2016-09-17 – 2016-09-18 (×2): 500 ug via ORAL
  Filled 2016-09-17 (×2): qty 1

## 2016-09-17 MED ORDER — ONDANSETRON HCL 4 MG/2ML IJ SOLN
INTRAMUSCULAR | Status: DC | PRN
  Administered 2016-09-17: 4 mg via INTRAVENOUS

## 2016-09-17 MED ORDER — POTASSIUM CHLORIDE CRYS ER 20 MEQ PO TBCR
20.0000 meq | EXTENDED_RELEASE_TABLET | Freq: Every day | ORAL | Status: DC
  Administered 2016-09-18: 20 meq via ORAL
  Filled 2016-09-17: qty 1

## 2016-09-17 MED ORDER — HEPARIN SODIUM (PORCINE) 1000 UNIT/ML IJ SOLN
INTRAMUSCULAR | Status: DC | PRN
Start: 2016-09-17 — End: 2016-09-17
  Administered 2016-09-17: 1000 [IU] via INTRAVENOUS

## 2016-09-17 MED ORDER — IOPAMIDOL (ISOVUE-370) INJECTION 76%
INTRAVENOUS | Status: DC | PRN
  Administered 2016-09-17: 1 mg via INTRAVENOUS

## 2016-09-17 MED ORDER — DEXAMETHASONE SODIUM PHOSPHATE 10 MG/ML IJ SOLN
INTRAMUSCULAR | Status: DC | PRN
  Administered 2016-09-17: 10 mg via INTRAVENOUS

## 2016-09-17 MED ORDER — EPHEDRINE SULFATE 50 MG/ML IJ SOLN
INTRAMUSCULAR | Status: DC | PRN
  Administered 2016-09-17 (×2): 5 mg via INTRAVENOUS

## 2016-09-17 MED ORDER — LIDOCAINE HCL (CARDIAC) 20 MG/ML IV SOLN
INTRAVENOUS | Status: DC | PRN
  Administered 2016-09-17: 100 mg via INTRAVENOUS

## 2016-09-17 MED ORDER — SODIUM CHLORIDE 0.9 % IV SOLN: 250.0000 mL | INTRAVENOUS | Status: DC | PRN

## 2016-09-17 MED ORDER — ONDANSETRON HCL 4 MG/2ML IJ SOLN: 4.0000 mg | Freq: Four times a day (QID) | INTRAMUSCULAR | Status: DC | PRN

## 2016-09-17 MED ORDER — HYDROCODONE-ACETAMINOPHEN 5-325 MG PO TABS: 1.0000 | ORAL_TABLET | ORAL | Status: DC | PRN

## 2016-09-17 SURGICAL SUPPLY — 18 items
BAG SNAP BAND KOVER 36X36 (MISCELLANEOUS) ×3 IMPLANT
BLANKET WARM UNDERBOD FULL ACC (MISCELLANEOUS) ×3 IMPLANT
CATH NAVISTAR SMARTTOUCH DF (ABLATOR) ×3 IMPLANT
CATH SOUNDSTAR 3D IMAGING (CATHETERS) ×3 IMPLANT
CATH VARIABLE LASSO NAV 2515 (CATHETERS) ×3 IMPLANT
CATH WEBSTER BI DIR CS D-F CRV (CATHETERS) ×3 IMPLANT
COVER SWIFTLINK CONNECTOR (BAG) ×3 IMPLANT
NEEDLE TRANSEP BRK 71CM 407200 (NEEDLE) ×3 IMPLANT
PACK EP LATEX FREE (CUSTOM PROCEDURE TRAY) ×2
PACK EP LF (CUSTOM PROCEDURE TRAY) ×1 IMPLANT
PAD DEFIB LIFELINK (PAD) ×3 IMPLANT
PATCH CARTO3 (PAD) ×3 IMPLANT
SHEATH AVANTI 11F 11CM (SHEATH) ×3 IMPLANT
SHEATH PINNACLE 7F 10CM (SHEATH) ×6 IMPLANT
SHEATH PINNACLE 9F 10CM (SHEATH) ×3 IMPLANT
SHEATH SWARTZ TS SL2 63CM 8.5F (SHEATH) ×3 IMPLANT
SHIELD RADPAD SCOOP 12X17 (MISCELLANEOUS) ×3 IMPLANT
TUBING SMART ABLATE COOLFLOW (TUBING) ×3 IMPLANT

## 2016-09-17 NOTE — Anesthesia Procedure Notes (Signed)
Procedure Name: LMA Insertion Date/Time: 09/17/2016 7:51 AM Performed by: Jacquiline Doe A Pre-anesthesia Checklist: Patient identified, Emergency Drugs available, Suction available and Patient being monitored Patient Re-evaluated:Patient Re-evaluated prior to inductionOxygen Delivery Method: Circle System Utilized and Circle system utilized Preoxygenation: Pre-oxygenation with 100% oxygen Intubation Type: IV induction Ventilation: Mask ventilation without difficulty LMA: LMA inserted LMA Size: 4.0 Tube type: Oral Number of attempts: 1 Placement Confirmation: positive ETCO2 Tube secured with: Tape Dental Injury: Teeth and Oropharynx as per pre-operative assessment

## 2016-09-17 NOTE — Progress Notes (Addendum)
Site area: RFV x 3 Site Prior to Removal:  Level 0 Pressure Applied For:20 min Manual:  yes  Patient Status During Pull:  stable Post Pull Site:  Level 0 Post Pull Instructions Given:yes   Post Pull Pulses Present:palpable  Dressing Applied:  tegaderm Bedrest begins @ [id number] till 1815 Comments:by Katelin/canderson

## 2016-09-17 NOTE — Discharge Summary (Signed)
ELECTROPHYSIOLOGY PROCEDURE DISCHARGE SUMMARY    Patient ID: Isaac [name],  MRN: [id number], DOB/AGE: 1951/08/12 65 y.o.  Admit date: 09/17/2016 Discharge date: 09/18/2016  Primary Care Physician: Henrine Screws, MD Primary Cardiologist: Dr. Tamala Julian Electrophysiologist: Thompson Grayer, MD  Primary Discharge Diagnosis:  1. Paroxysmal AFib     CHA2DS2Vasc is at least 3, on Eliquis  Secondary Discharge Diagnosis:  1. CAD 2. ICM  Procedures This Admission:  1.  Electrophysiology study and radiofrequency catheter ablation on 09/17/16 by Dr Thompson Grayer.  This study demonstrated  CONCLUSIONS: 1. Sinus rhythm upon presentation.   2. Intracardiac echo reveals a moderate sized left atrium with four separate pulmonary veins.  The left PVs were small. 3. Successful electrical isolation and anatomical encircling of all four pulmonary veins with radiofrequency current. 4. No inducible arrhythmias following ablation both on and off of Isuprel 5. No early apparent complications.   Brief HPI: Isaac [name] is a 65 y.o. male with a history of paroxysmal atrial fibrillation.  They have failed medical therapy with Tikosyn. Risks, benefits, and alternatives to catheter ablation of atrial fibrillation were reviewed with the patient who wished to proceed.  The patient underwent cardiac CT prior to the procedure which demonstrated no LAA thrombus.    Hospital Course:  The patient was admitted and underwent EPS/RFCA of atrial fibrillation with details as outlined above.  They were monitored on telemetry overnight which demonstrated SR.  Groin was without complication on the day of discharge.  The patient was examined and considered to be stable for discharge.  Wound care and restrictions were reviewed with the patient.  The patient will be seen back by Roderic Palau, NP in 4 weeks and Dr Rayann Heman in 12 weeks for post ablation follow up.    Physical Exam: Vitals:   09/18/16 0600 09/18/16 0700  09/18/16 0800 09/18/16 0815  BP: 105/62 124/61 112/65   Pulse: 61 70 61   Resp: 16 20 16    Temp:    97.4 F (36.3 C)  TempSrc:    Oral  SpO2: 97% 100% 97%   Weight:      Height:        GEN- The patient is well appearing, alert and oriented x 3 today.   HEENT: normocephalic, atraumatic; sclera clear, conjunctiva pink; hearing intact; oropharynx clear; neck supple  Lungs- Clear to ausculation bilaterally, normal work of breathing.  No wheezes, rales, rhonchi Heart- RRR, no murmurs, rubs or gallops  GI- soft, non-tender, non-distended, bowel sounds present  Extremities- no clubbing, cyanosis, or edema; DP/PT/radial pulses 2+ bilaterally, groin without hematoma/bruit MS- no significant deformity or atrophy Skin- warm and dry, no rash or lesion Psych- euthymic mood, full affect Neuro- strength and sensation are intact   Labs:   Lab Results  Component Value Date   WBC 5.5 09/03/2016   HGB 15.1 09/03/2016   HCT 45.1 09/03/2016   MCV 93.8 09/03/2016   PLT 207 09/03/2016     Recent Labs Lab 09/17/16 1357  NA 143  K 3.6  CL 114*  CO2 22  BUN 15  CREATININE 1.06  CALCIUM 8.7*  GLUCOSE 140*     Discharge Medications:    Medication List    TAKE these medications   acetaminophen 325 MG tablet Commonly known as:  TYLENOL Take 325 mg by mouth every 6 (six) hours as needed for mild pain.   apixaban 5 MG Tabs tablet Commonly known as:  ELIQUIS TAKE 1 TABLET BY MOUTH 2  TIMES DAILY.   cetirizine 10 MG tablet Commonly known as:  ZYRTEC Take 10 mg by mouth daily.   cholecalciferol 400 units Tabs tablet Commonly known as:  VITAMIN D Take 1,200 Units by mouth daily.   dofetilide 500 MCG capsule Commonly known as:  TIKOSYN Take 1 capsule (500 mcg total) by mouth 2 (two) times daily.   losartan 25 MG tablet Commonly known as:  COZAAR Take 1 tablet (25 mg total) by mouth daily.   metoprolol tartrate 25 MG tablet Commonly known as:  LOPRESSOR Take 0.5 tablets (12.5  mg total) by mouth 2 (two) times daily.   nitroGLYCERIN 0.4 MG SL tablet Commonly known as:  NITROSTAT Place 0.4 mg under the tongue every 5 (five) minutes as needed for chest pain.   pantoprazole 40 MG tablet Commonly known as:  PROTONIX Take 1 tablet (40 mg total) by mouth daily.   potassium chloride SA 20 MEQ tablet Commonly known as:  K-DUR,KLOR-CON Take 1 tablet (20 mEq total) by mouth daily.   rosuvastatin 5 MG tablet Commonly known as:  CRESTOR Take 1 tablet (5 mg total) by mouth daily.   tamsulosin 0.4 MG Caps capsule Commonly known as:  FLOMAX Take 0.4 mg by mouth daily after supper.       Disposition:  Home Discharge Instructions    Diet - low sodium heart healthy    Complete by:  As directed    Increase activity slowly    Complete by:  As directed      Follow-up Information    [facility] Follow up on 10/17/2016.   Specialty:  Cardiology Why:  9:00AM Contact information: [address] [id number] [place] [phone]       Thompson Grayer, MD Follow up on 12/18/2016.   Specialty:  Cardiology Why:  9:45AM Contact information: [place] [address] [phone]           Duration of Discharge Encounter: Greater than 30 minutes including physician time.  Signed, Tommye Standard, PA-C 09/18/2016 8:22 AM  I have seen, examined the patient, and reviewed the above assessment and plan.  On exam, RRR.  Changes to above are made where necessary.    Co Sign: Thompson Grayer, MD 09/18/2016 5:01 PM

## 2016-09-17 NOTE — Anesthesia Postprocedure Evaluation (Signed)
Anesthesia Post Note  Patient: Isaac [name]  Procedure(s) Performed: Procedure(s) (LRB): Atrial Fibrillation Ablation (N/A)  Patient location during evaluation: PACU Anesthesia Type: General Level of consciousness: sedated Pain management: pain level controlled Vital Signs Assessment: post-procedure vital signs reviewed and stable Respiratory status: spontaneous breathing and respiratory function stable Cardiovascular status: stable Anesthetic complications: no    Last Vitals:  Vitals:   09/17/16 1215 09/17/16 1217  BP: 101/60 (!) 104/58  Pulse: 65 (!) 56  Resp: 16 (!) 9  Temp:  36.6 C    Last Pain:  Vitals:   09/17/16 0546  TempSrc: Oral                 Chirstine Defrain DANIEL

## 2016-09-17 NOTE — Transfer of Care (Signed)
Immediate Anesthesia Transfer of Care Note  Patient: Isaac [name]  Procedure(s) Performed: Procedure(s): Atrial Fibrillation Ablation (N/A)  Patient Location: Cath Lab  Anesthesia Type:General  Level of Consciousness: awake, oriented, sedated, patient cooperative and responds to stimulation  Airway & Oxygen Therapy: Patient Spontanous Breathing and Patient connected to nasal cannula oxygen  Post-op Assessment: Report given to RN, Post -op Vital signs reviewed and stable, Patient moving all extremities and Patient moving all extremities X 4  Post vital signs: Reviewed and stable  Last Vitals:  Vitals:   09/17/16 0546  BP: 97/61  Pulse: 63  Resp: 18  Temp: 36.4 C    Last Pain:  Vitals:   09/17/16 0546  TempSrc: Oral         Complications: No apparent anesthesia complications

## 2016-09-17 NOTE — H&P (Signed)
Chief Complaint  Patient presents with  . Atrial Fibrillation     History of Present Illness: Isaac [name] is a 65 y.o. male who presents today for afib ablation.   He still has occasional afib, about 1-2 times per week, lasting 2-3 hours.   Today, he denies symptoms of chest pain, shortness of breath, orthopnea, PND, lower extremity edema, claudication, dizziness, presyncope, syncope, bleeding, or neurologic sequela. The patient is tolerating medications without difficulties and is otherwise without complaint today.        Past Medical History:  Diagnosis Date  . CERVICAL SPINE DISORDER, NOS   . Chronic systolic (congestive) heart failure (HCC)    EF 40%  . Coronary artery disease    a. s/p CABG  . 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 METATARSALGIA   . OSTEOARTHROSIS, LOCAL, SCND, HAND   . Paroxysmal atrial fibrillation [facility])         Past Surgical History:  Procedure Laterality Date  . APPENDECTOMY    . CHOLECYSTECTOMY    . CORONARY ARTERY BYPASS GRAFT  10/2009           Current Outpatient Prescriptions  Medication Sig Dispense Refill  . apixaban (ELIQUIS) 5 MG TABS tablet TAKE 1 TABLET BY MOUTH 2 TIMES DAILY. 180 tablet 1  . cetirizine (ZYRTEC) 10 MG tablet Take 10 mg by mouth daily.    . cholecalciferol (VITAMIN D) 400 units TABS tablet Take 1,200 Units by mouth daily.     Marland Kitchen dofetilide (TIKOSYN) 500 MCG capsule Take 1 capsule (500 mcg total) by mouth 2 (two) times daily. 90 capsule 3  . metoprolol tartrate (LOPRESSOR) 25 MG tablet Take 0.5 tablets (12.5 mg total) by mouth 2 (two) times daily. 90 tablet 1  . nitroGLYCERIN (NITROSTAT) 0.4 MG SL tablet Place 0.4 mg under the tongue every 5 (five) minutes as needed for chest pain.    . potassium chloride SA (K-DUR,KLOR-CON) 20 MEQ tablet Take 1 tablet (20 mEq total) by mouth daily. 90 tablet 1  . rosuvastatin (CRESTOR) 5 MG tablet Take 1 tablet (5 mg total) by mouth daily. 90 tablet 1  .  Tamsulosin HCl (FLOMAX) 0.4 MG CAPS Take 0.4 mg by mouth daily after supper.     No current facility-administered medications for this visit.     Allergies:   Review of patient's allergies indicates no known allergies.   Social History:  The patient  reports that he has never smoked. He has never used smokeless tobacco. He reports that he drinks alcohol. He reports that he does not use drugs.   Family History:  The patient's  family history includes Emphysema in his father and mother.    ROS:  Please see the history of present illness.   All other systems are reviewed and negative.    PHYSICAL EXAM: Vitals:   09/17/16 0546  BP: 97/61  Pulse: 63  Resp: 18  Temp: 97.6 F (36.4 C)   GEN: Well nourished, well developed, in no acute distress  HEENT: normal  Neck: no JVD, carotid bruits, or masses Cardiac: RRR; no murmurs, rubs, or gallops,no edema  Respiratory:  clear to auscultation bilaterally, normal work of breathing GI: soft, nontender, nondistended, + BS MS: no deformity or atrophy  Skin: warm and dry  Neuro:  Strength and sensation are intact Psych: euthymic mood, full affect      Wt Readings from Last 3 Encounters:  07/31/16 155 lb 12.8 oz (70.7 kg)  05/01/16 159 lb  3.2 oz (72.2 kg)  01/29/16 163 lb (73.9 kg)    ASSESSMENT AND PLAN:  1.  Paroxysmal atrial fibrillation failed medical therapy with tikosyn. Therapeutic strategies for afib including medicine and ablation were discussed in detail with the patient today. Risk, benefits, and alternatives to EP study and radiofrequency ablation for afib were also discussed in detail today. These risks include but are not limited to stroke, bleeding, vascular damage, tamponade, perforation, damage to the esophagus, lungs, and other structures, pulmonary vein stenosis, worsening renal function, and death. The patient understands these risk and wishes to proceed at this time.  Cardiac CT is reviewed. Reports  compliance with eliquis   2. CAD No ischemic symptoms No changes today Will have Dr Tamala Julian review CT  3. Chronic systolic dysfunction Repeat echo reveals EF 40%  Thompson Grayer MD, [facility] 09/17/2016 7:31 AM

## 2016-09-18 DIAGNOSIS — I2510 Atherosclerotic heart disease of native coronary artery without angina pectoris: Secondary | ICD-10-CM | POA: Diagnosis not present

## 2016-09-18 DIAGNOSIS — I5022 Chronic systolic (congestive) heart failure: Secondary | ICD-10-CM | POA: Diagnosis not present

## 2016-09-18 DIAGNOSIS — I255 Ischemic cardiomyopathy: Secondary | ICD-10-CM | POA: Diagnosis not present

## 2016-09-18 DIAGNOSIS — I480 Paroxysmal atrial fibrillation: Secondary | ICD-10-CM | POA: Diagnosis not present

## 2016-09-18 MED ORDER — PANTOPRAZOLE SODIUM 40 MG PO TBEC: 40.0000 mg | DELAYED_RELEASE_TABLET | Freq: Every day | ORAL | 0 refills | Status: DC

## 2016-09-18 MED FILL — PANTOPRAZOLE SOD DR 40 MG T: 40 | 45 days supply | Qty: 45 | Fill #0

## 2016-09-18 NOTE — Discharge Instructions (Signed)
No driving for 1 week. No lifting over 5 lbs for 1 week. No vigorous or sexual activity for 1 week. You may return to work on 09/24/16. Keep procedure site clean & dry. If you notice increased pain, swelling, bleeding or pus, call/return!  You may shower, but no soaking baths/hot tubs/pools for 1 week.  ° °---------- ° ° °You have an appointment set up with the Atrial Fibrillation Clinic.  Multiple studies have shown that being followed by a dedicated atrial fibrillation clinic in addition to the standard care you receive from your other physicians improves health. We believe that enrollment in the atrial fibrillation clinic will allow us to better care for you.  ° °The phone number to the Atrial Fibrillation Clinic is [phone]. The clinic is staffed Monday through Friday from 8:30am to 5pm. ° °Parking Directions: The clinic is located in the Heart and Vascular Building connected to [facility]. °1)From [address] turn on to [address] and go to the 3rd entrance  (Heart and Vascular entrance) on the right. °2)Look to the right for Heart &Vascular Parking Garage. °3)A code for the entrance is required please call the clinic to receive this.   °4)Take the elevators to the 1st floor. Registration is in the room with the glass walls at the end of the hallway. ° °If you have any trouble parking or locating the clinic, please don’t hesitate to call [phone]. ° ° °

## 2016-09-18 NOTE — Progress Notes (Signed)
Patient discharge paperwork gone over with patient and his wife in detail. All questions answered to patient's satisfaction. Patient and wife educated on all medications. Patient iv removed intact, telemetry discontinued. Patient discharged to home with wife by way of wheelchair.   Richie Vadala N. Emogene Morgan, RN

## 2016-09-19 ENCOUNTER — Telehealth: Payer: Self-pay | Admitting: *Deleted

## 2016-09-19 DIAGNOSIS — I2510 Atherosclerotic heart disease of native coronary artery without angina pectoris: Secondary | ICD-10-CM

## 2016-09-19 NOTE — Telephone Encounter (Signed)
Follow Up:; ° ° °Returning your call. °

## 2016-09-19 NOTE — Telephone Encounter (Signed)
-----   Message from Belva Crome, MD sent at 09/19/2016 11:58 AM EST ----- Discussed with Yvone Neu. We will stress him in a month or two to get idea about perfusion  Anderson Malta, please schedule a stress nuclear in 4-6 weeks. Dx: CAD with suspected graft closure. ----- Message ----- From: Thompson Grayer, MD Sent: 09/17/2016   7:32 AM To: Belva Crome, MD  Jeralene Peters,  Review [facility] cardiac CT (performed for AF ablation). Make sure you dont think we need to do any additional workup.  Thanks! JA

## 2016-09-19 NOTE — Telephone Encounter (Signed)
Left message to call back  

## 2016-09-19 NOTE — Telephone Encounter (Signed)
Spoke with pt and went over instructions for stress test.  Pt verbalized understanding and was in agreement with this plan.

## 2016-09-23 MED FILL — ELIQUIS 5 MG TABLET: 5 | 90 days supply | Qty: 180 | Fill #1

## 2016-10-17 ENCOUNTER — Encounter (HOSPITAL_COMMUNITY): Payer: Self-pay | Admitting: Nurse Practitioner

## 2016-10-17 ENCOUNTER — Ambulatory Visit (HOSPITAL_COMMUNITY)
Admission: RE | Admit: 2016-10-17 | Discharge: 2016-10-17 | Disposition: A | Discharge status: Home / Self Care | Payer: Medicare Other | Source: Ambulatory Visit | Attending: Nurse Practitioner | Admitting: Nurse Practitioner

## 2016-10-17 VITALS — BP 106/64 | HR 60 | Ht 70.0 in | Wt 159.6 lb

## 2016-10-17 DIAGNOSIS — I480 Paroxysmal atrial fibrillation: Secondary | ICD-10-CM | POA: Diagnosis not present

## 2016-10-17 DIAGNOSIS — I4891 Unspecified atrial fibrillation: Secondary | ICD-10-CM | POA: Diagnosis present

## 2016-10-17 LAB — BASIC METABOLIC PANEL
Anion gap: 9 (ref 5–15)
BUN: 14 mg/dL (ref 6–20)
CHLORIDE: 110 mmol/L (ref 101–111)
CO2: 22 mmol/L (ref 22–32)
CREATININE: 0.99 mg/dL (ref 0.61–1.24)
Calcium: 9.5 mg/dL (ref 8.9–10.3)
GFR calc non Af Amer: >60 mL/min (ref >60)
Glucose, Bld: 96 mg/dL (ref 65–99)
POTASSIUM: 4 mmol/L (ref 3.5–5.1)
SODIUM: 141 mmol/L (ref 135–145)

## 2016-10-17 LAB — MAGNESIUM: MAGNESIUM: 2.1 mg/dL (ref 1.7–2.4)

## 2016-10-17 NOTE — Progress Notes (Signed)
Primary Care Physician: Henrine Screws, MD Referring Physician:  Dr. Carolin Sicks Isaac [name] is a 65 y.o. male with a h/o CAD/s/p bypass,paroxsymal afib that had ablation 11/7, after failing tikosyn to keep in rhythm. He feels well and has just noticed a few skipped beats since the ablation. Cardiac CT showed atretic LAD and he is scheduled for stress test. Denies any swallowing or groin issues.  Today, he denies symptoms of palpitations, chest pain, shortness of breath, orthopnea, PND, lower extremity edema, dizziness, presyncope, syncope, or neurologic sequela. The patient is tolerating medications without difficulties and is otherwise without complaint today.   Past Medical History:  Diagnosis Date  . CERVICAL SPINE DISORDER, NOS   . Chronic systolic (congestive) heart failure    EF 40%  . Coronary artery disease    a. s/p CABG  . 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 METATARSALGIA   . OSTEOARTHROSIS, LOCAL, SCND, HAND   . Paroxysmal atrial fibrillation [facility])    Past Surgical History:  Procedure Laterality Date  . APPENDECTOMY    . CHOLECYSTECTOMY    . CORONARY ARTERY BYPASS GRAFT  10/2009  . ELECTROPHYSIOLOGIC STUDY N/A 09/17/2016   Procedure: Atrial Fibrillation Ablation;  Surgeon: Thompson Grayer, MD;  Location: [place] [facility];  Service: Cardiovascular;  Laterality: N/A;    Current Outpatient Prescriptions  Medication Sig Dispense Refill  . acetaminophen (TYLENOL) 325 MG tablet Take 325 mg by mouth every 6 (six) hours as needed for mild pain.    Marland Kitchen apixaban (ELIQUIS) 5 MG TABS tablet TAKE 1 TABLET BY MOUTH 2 TIMES DAILY. 180 tablet 1  . cetirizine (ZYRTEC) 10 MG tablet Take 10 mg by mouth daily.    . cholecalciferol (VITAMIN D) 400 units TABS tablet Take 1,200 Units by mouth daily.     Marland Kitchen dofetilide (TIKOSYN) 500 MCG capsule Take 1 capsule (500 mcg total) by mouth 2 (two) times daily. 90 capsule 3  . losartan (COZAAR) 25 MG tablet Take 1 tablet (25 mg total) by mouth  daily. 90 tablet 3  . metoprolol tartrate (LOPRESSOR) 25 MG tablet Take 0.5 tablets (12.5 mg total) by mouth 2 (two) times daily. 90 tablet 1  . nitroGLYCERIN (NITROSTAT) 0.4 MG SL tablet Place 0.4 mg under the tongue every 5 (five) minutes as needed for chest pain.    . pantoprazole (PROTONIX) 40 MG tablet Take 1 tablet (40 mg total) by mouth daily. 45 tablet 0  . potassium chloride SA (K-DUR,KLOR-CON) 20 MEQ tablet Take 1 tablet (20 mEq total) by mouth daily. 90 tablet 1  . rosuvastatin (CRESTOR) 5 MG tablet Take 1 tablet (5 mg total) by mouth daily. 90 tablet 1  . Tamsulosin HCl (FLOMAX) 0.4 MG CAPS Take 0.4 mg by mouth daily after supper.     No current facility-administered medications for this encounter.     No Known Allergies  Social History   Social History  . Marital status: Married    Spouse name: N/A  . Number of children: N/A  . Years of education: N/A   Occupational History  . Not on file.   Social History Main Topics  . Smoking status: Never Smoker  . Smokeless tobacco: Never Used  . Alcohol use Yes     Comment: 3  . Drug use: No  . Sexual activity: Not Currently   Other Topics Concern  . Not on file   Social History Narrative  . No narrative on file    Family  History  Problem Relation Age of Onset  . Emphysema Mother   . Emphysema Father     ROS- All systems are reviewed and negative except as per the HPI above  Physical Exam: Vitals:   10/17/16 0908  BP: 106/64  Pulse: 60  Weight: 159 lb 9.6 oz (72.4 kg)  Height: 5\' 10"  (1.778 m)   Wt Readings from Last 3 Encounters:  10/17/16 159 lb 9.6 oz (72.4 kg)  09/17/16 153 lb (69.4 kg)  07/31/16 155 lb 12.8 oz (70.7 kg)    Labs: Lab Results  Component Value Date   NA 143 09/17/2016   K 3.6 09/17/2016   CL 114 (H) 09/17/2016   CO2 22 09/17/2016   GLUCOSE 140 (H) 09/17/2016   BUN 15 09/17/2016   CREATININE 1.06 09/17/2016   CALCIUM 8.7 (L) 09/17/2016   MG 1.8 09/17/2016   Lab Results    Component Value Date   INR 1.5 12/14/2013   Lab Results  Component Value Date   CHOL 114 05/22/2012   HDL 39 (L) 05/22/2012   LDLCALC 54 05/22/2012   TRIG 105 05/22/2012     GEN- The patient is well appearing, alert and oriented x 3 today.   Head- normocephalic, atraumatic Eyes-  Sclera clear, conjunctiva pink Ears- hearing intact Oropharynx- clear Neck- supple, no JVP Lymph- no cervical lymphadenopathy Lungs- Clear to ausculation bilaterally, normal work of breathing Heart- Regular rate and rhythm, no murmurs, rubs or gallops, PMI not laterally displaced GI- soft, NT, ND, + BS Extremities- no clubbing, cyanosis, or edema MS- no significant deformity or atrophy Skin- no rash or lesion Psych- euthymic mood, full affect Neuro- strength and sensation are intact  EKG- NSR at 60 bpm, pr int 156 ms, qrs int 80 ms, qtc 454 ms Epic records reviewed    Assessment and Plan: 1.Paroxysmal  afib S/p ablation 11/7 Maintaining SR No apparent complications Continue apixaban Continue dofetilide 500 mg bid Continue emtoprolol Bmet/mag today  2. CAD Atretic LAD by cardiac CT Stress test pending  Can resume normal activites F/u with Dr. Rayann Heman as scheduled 2/7 Dr. Tamala Julian 2/19 afib clinic as needed  Butch Penny C. Elyn Krogh, [facility] [address] [place], [place] [address] [phone]

## 2016-10-22 ENCOUNTER — Telehealth (HOSPITAL_COMMUNITY): Payer: Self-pay | Admitting: *Deleted

## 2016-10-22 NOTE — Telephone Encounter (Signed)
Patient given detailed instructions per Myocardial Perfusion Study Information Sheet for the test on 10/24/16. Patient notified to arrive 15 minutes early and that it is imperative to arrive on time for appointment to keep from having the test rescheduled.  If you need to cancel or reschedule your appointment, please call the office within 24 hours of your appointment. Failure to do so may result in a cancellation of your appointment, and a $50 no show fee. Patient verbalized understanding. Aalaya Yadao Jacqueline    

## 2016-10-24 ENCOUNTER — Ambulatory Visit (HOSPITAL_COMMUNITY): Payer: Medicare Other | Attending: Cardiology

## 2016-10-24 DIAGNOSIS — I501 Left ventricular failure: Secondary | ICD-10-CM | POA: Diagnosis not present

## 2016-10-24 DIAGNOSIS — I2510 Atherosclerotic heart disease of native coronary artery without angina pectoris: Secondary | ICD-10-CM | POA: Diagnosis present

## 2016-10-24 DIAGNOSIS — I252 Old myocardial infarction: Secondary | ICD-10-CM | POA: Insufficient documentation

## 2016-10-24 DIAGNOSIS — R9439 Abnormal result of other cardiovascular function study: Secondary | ICD-10-CM | POA: Diagnosis not present

## 2016-10-24 DIAGNOSIS — I517 Cardiomegaly: Secondary | ICD-10-CM | POA: Diagnosis not present

## 2016-10-24 LAB — MYOCARDIAL PERFUSION IMAGING
CHL CUP NUCLEAR SRS: 27
CHL CUP NUCLEAR SSS: 29
CHL CUP RESTING HR STRESS: 55 {beats}/min
CHL RATE OF PERCEIVED EXERTION: 17
CSEPED: 7 min
CSEPEDS: 0 s
CSEPEW: 8.5 METS
CSEPHR: 101 %
LV dias vol: 139 mL (ref 62–150)
LV sys vol: 83 mL
MPHR: 153 {beats}/min
Peak HR: 155 {beats}/min
RATE: 0.27
SDS: 2
TID: 1.01

## 2016-10-24 MED ORDER — TECHNETIUM TC 99M TETROFOSMIN IV KIT
10.7000 | PACK | Freq: Once | INTRAVENOUS | Status: AC | PRN
  Administered 2016-10-24: 10.7 via INTRAVENOUS
  Filled 2016-10-24: qty 11

## 2016-10-24 MED ORDER — TECHNETIUM TC 99M TETROFOSMIN IV KIT
31.9000 | PACK | Freq: Once | INTRAVENOUS | Status: AC | PRN
  Administered 2016-10-24: 31.9 via INTRAVENOUS
  Filled 2016-10-24: qty 32

## 2016-10-24 MED FILL — LOSARTAN POTASSIUM 25 MG TA: 25 | 90 days supply | Qty: 90 | Fill #1

## 2016-11-25 MED FILL — TAMSULOSIN HCL 0.4 MG CAP: 0.4 | 90 days supply | Qty: 90 | Fill #3

## 2016-11-25 MED FILL — DOFETILIDE 500 MCG CAPSULE: 500 | 30 days supply | Qty: 60 | Fill #2

## 2016-11-29 MED FILL — METOPROLOL TARTRATE 25 MG T: 25 | 30 days supply | Qty: 30 | Fill #5

## 2016-12-05 MED FILL — KLOR-CON M20 TABLET: 20 | 30 days supply | Qty: 30 | Fill #5

## 2016-12-11 ENCOUNTER — Other Ambulatory Visit: Payer: Self-pay | Admitting: *Deleted

## 2016-12-11 MED ORDER — ROSUVASTATIN CALCIUM 5 MG PO TABS: 5.0000 mg | ORAL_TABLET | Freq: Every day | ORAL | 1 refills | Status: DC

## 2016-12-11 MED FILL — ROSUVASTATIN CALCIUM 5 MG T: 5 | 90 days supply | Qty: 90 | Fill #0

## 2016-12-12 MED FILL — ATOVAQUONE-PROGUANIL 250-10: 250-100 | 30 days supply | Qty: 30 | Fill #0

## 2016-12-18 ENCOUNTER — Encounter: Payer: Self-pay | Admitting: Internal Medicine

## 2016-12-18 ENCOUNTER — Ambulatory Visit (INDEPENDENT_AMBULATORY_CARE_PROVIDER_SITE_OTHER): Payer: Medicare Other | Admitting: Internal Medicine

## 2016-12-18 VITALS — BP 108/64 | HR 52 | Ht 70.0 in | Wt 159.4 lb

## 2016-12-18 DIAGNOSIS — I2510 Atherosclerotic heart disease of native coronary artery without angina pectoris: Secondary | ICD-10-CM | POA: Diagnosis not present

## 2016-12-18 DIAGNOSIS — I480 Paroxysmal atrial fibrillation: Secondary | ICD-10-CM | POA: Diagnosis not present

## 2016-12-18 NOTE — Progress Notes (Signed)
Electrophysiology Office Note   Date:  12/18/2016   ID:  Anes, Fross [birth year], MRN [id number]  PCP:  Henrine Screws, MD  Cardiologist:  Dr Tamala Julian Primary Electrophysiologist: Thompson Grayer, MD    Chief Complaint  Patient presents with  . Atrial Fibrillation     History of Present Illness: Isaac [name] is a 66 y.o. male who presents today for electrophysiology evaluation. He has done well since his ablation.  Denies procedure related complications.  He has had a few short palpitations but no symptoms of afib.  Today, he denies symptoms of chest pain, shortness of breath, orthopnea, PND, lower extremity edema, claudication, dizziness, presyncope, syncope, bleeding, or neurologic sequela. The patient is tolerating medications without difficulties and is otherwise without complaint today.    Past Medical History:  Diagnosis Date  . CERVICAL SPINE DISORDER, NOS   . Chronic systolic (congestive) heart failure    EF 40%  . Coronary artery disease    a. s/p CABG  . 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 METATARSALGIA   . OSTEOARTHROSIS, LOCAL, SCND, HAND   . Paroxysmal atrial fibrillation [facility])    Past Surgical History:  Procedure Laterality Date  . APPENDECTOMY    . CHOLECYSTECTOMY    . CORONARY ARTERY BYPASS GRAFT  10/2009  . ELECTROPHYSIOLOGIC STUDY N/A 09/17/2016   Procedure: Atrial Fibrillation Ablation;  Surgeon: Thompson Grayer, MD;  Location: [place] [facility];  Service: Cardiovascular;  Laterality: N/A;     Current Outpatient Prescriptions  Medication Sig Dispense Refill  . acetaminophen (TYLENOL) 325 MG tablet Take 325 mg by mouth every 6 (six) hours as needed for mild pain.    Marland Kitchen apixaban (ELIQUIS) 5 MG TABS tablet TAKE 1 TABLET BY MOUTH 2 TIMES DAILY. 180 tablet 1  . atovaquone-proguanil (MALARONE) 250-100 MG TABS tablet Will start in August 2018    . cetirizine (ZYRTEC) 10 MG tablet Take 10 mg by mouth daily.    . cholecalciferol (VITAMIN D) 400 units  TABS tablet Take 1,200 Units by mouth daily.     Marland Kitchen dofetilide (TIKOSYN) 500 MCG capsule Take 1 capsule (500 mcg total) by mouth 2 (two) times daily. 90 capsule 3  . losartan (COZAAR) 25 MG tablet Take 1 tablet (25 mg total) by mouth daily. 90 tablet 3  . metoprolol tartrate (LOPRESSOR) 25 MG tablet Take 0.5 tablets (12.5 mg total) by mouth 2 (two) times daily. 90 tablet 1  . nitroGLYCERIN (NITROSTAT) 0.4 MG SL tablet Place 0.4 mg under the tongue every 5 (five) minutes as needed for chest pain.    . potassium chloride SA (K-DUR,KLOR-CON) 20 MEQ tablet Take 1 tablet (20 mEq total) by mouth daily. 90 tablet 1  . rosuvastatin (CRESTOR) 5 MG tablet Take 1 tablet (5 mg total) by mouth daily. 90 tablet 1  . Tamsulosin HCl (FLOMAX) 0.4 MG CAPS Take 0.4 mg by mouth daily after supper.     No current facility-administered medications for this visit.     Allergies:   Patient has no known allergies.   Social History:  The patient  reports that he is a non-smoker but has been exposed to tobacco smoke. He has never used smokeless tobacco. He reports that he drinks alcohol. He reports that he does not use drugs.   Family History:  The patient's  family history includes Emphysema in his father and mother.    ROS:  Please see the history of present illness.   All other systems  are reviewed and negative.    PHYSICAL EXAM: VS:  BP 108/64   Pulse (!) 52   Ht 5\' 10"  (1.778 m)   Wt 159 lb 6.4 oz (72.3 kg)   BMI 22.87 kg/m  , BMI Body mass index is 22.87 kg/m. GEN: Well nourished, well developed, in no acute distress  HEENT: normal  Neck: no JVD, carotid bruits, or masses Cardiac: RRR; no murmurs, rubs, or gallops,no edema  Respiratory:  clear to auscultation bilaterally, normal work of breathing GI: soft, nontender, nondistended, + BS MS: no deformity or atrophy  Skin: warm and dry  Neuro:  Strength and sensation are intact Psych: euthymic mood, full affect  EKG:  EKG is ordered today. The ekg  ordered today shows sinus rhythm    Recent Labs: 09/03/2016: Hemoglobin 15.1; Platelets 207 10/17/2016: BUN 14; Creatinine, Ser 0.99; Magnesium 2.1; Potassium 4.0; Sodium 141    Lipid Panel     Component Value Date/Time   CHOL 114 05/22/2012 0221   TRIG 105 05/22/2012 0221   HDL 39 (L) 05/22/2012 0221   CHOLHDL 2.9 05/22/2012 0221   VLDL 21 05/22/2012 0221   LDLCALC 54 05/22/2012 0221     Wt Readings from Last 3 Encounters:  12/18/16 159 lb 6.4 oz (72.3 kg)  10/24/16 153 lb (69.4 kg)  10/17/16 159 lb 9.6 oz (72.4 kg)    ASSESSMENT AND PLAN:  1.  Paroxysmal atrial fibrillation Doing well s/p ablation without further AAD therapy Continue tikosyn.  He may be willing to stop this therapy after his trip to [place] in august. chads2vasc score is at least 2.  Continue eliquis.     2. CAD No ischemic symptoms No changes today Recent myoview discussed with patient He has plans to follow-up with Dr Tamala Julian in the near future.  3. Chronic systolic dysfunction Stable No change required today   Follow-up with Dr Tamala Julian as scheduled Return to see me in 3 months  Current medicines are reviewed at length with the patient today.   The patient does not have concerns regarding his medicines.  The following changes were made today:  none   Signed, Thompson Grayer, MD  12/18/2016 10:38 AM     [facility] [address] [place]  [place] [address] [phone] (office) [phone] (fax)

## 2016-12-18 NOTE — Patient Instructions (Signed)

## 2016-12-24 MED FILL — ELIQUIS 5 MG TABLET: 5 | 90 days supply | Qty: 180 | Fill #1

## 2016-12-24 MED FILL — METOPROLOL TARTRATE 25 MG T: 25 | 30 days supply | Qty: 30 | Fill #6

## 2016-12-24 MED FILL — DOFETILIDE 500 MCG CAPSULE: 500 | 30 days supply | Qty: 60 | Fill #3

## 2016-12-29 NOTE — Progress Notes (Signed)
Cardiology Office Note    Date:  12/30/2016   ID:  Arsen, Lanes 05/07/1951, MRN [id number]  PCP:  Henrine Screws, MD  Cardiologist: Sinclair Grooms, MD   Chief Complaint  Patient presents with  . Atrial Fibrillation  . Coronary Artery Disease  . Congestive Heart Failure    History of Present Illness:  Isaac [name] is a 66 y.o. male male with a h/o CAD/s/p bypass,paroxsymal afib that had ablation 11/7, after failing tikosyn to keep in rhythm. He feels well and has just noticed a few skipped beats since the ablation. Cardiac CT showed atretic LAD and non-ischemic nuclear study.  Asymptomatic with reference to exertional angina and other cardiac complaints. No recurrence of atrial fibrillation since ablation in October/November 2017. Pre-ablation CT angiogram demonstrated atresia/occlusion of LIMA to LAD with patent SVG to the diagonal and widely patent right coronary and circumflex. Emergency surgery performed approximately 8 years ago in the setting of acute infarction led to late reperfusion and high certainty of significant anterior wall injury. He is asymptomatic. He is active. LVEF estimated to be 40% by nuclear testing. There is no evidence of ischemia on perfusion imaging. The nucleated EF is probably an underestimate of the true EF as we frequently see with nuclear wall motion studies.  Past Medical History:  Diagnosis Date  . CERVICAL SPINE DISORDER, NOS   . Chronic systolic (congestive) heart failure    EF 40%  . Coronary artery disease    a. s/p CABG  . 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 METATARSALGIA   . OSTEOARTHROSIS, LOCAL, SCND, HAND   . Paroxysmal atrial fibrillation [facility])     Past Surgical History:  Procedure Laterality Date  . APPENDECTOMY    . CHOLECYSTECTOMY    . CORONARY ARTERY BYPASS GRAFT  10/2009  . ELECTROPHYSIOLOGIC STUDY N/A 09/17/2016   Procedure: Atrial Fibrillation Ablation;  Surgeon: Thompson Grayer, MD;  Location: [place]  CV LAB;  Service: Cardiovascular;  Laterality: N/A;    Current Medications: Outpatient Medications Prior to Visit  Medication Sig Dispense Refill  . acetaminophen (TYLENOL) 325 MG tablet Take 325 mg by mouth every 6 (six) hours as needed for mild pain.    Marland Kitchen apixaban (ELIQUIS) 5 MG TABS tablet TAKE 1 TABLET BY MOUTH 2 TIMES DAILY. 180 tablet 1  . atovaquone-proguanil (MALARONE) 250-100 MG TABS tablet Will start in August 2018    . cetirizine (ZYRTEC) 10 MG tablet Take 10 mg by mouth daily.    . cholecalciferol (VITAMIN D) 400 units TABS tablet Take 1,200 Units by mouth daily.     Marland Kitchen dofetilide (TIKOSYN) 500 MCG capsule Take 1 capsule (500 mcg total) by mouth 2 (two) times daily. 90 capsule 3  . metoprolol tartrate (LOPRESSOR) 25 MG tablet Take 0.5 tablets (12.5 mg total) by mouth 2 (two) times daily. 90 tablet 1  . nitroGLYCERIN (NITROSTAT) 0.4 MG SL tablet Place 0.4 mg under the tongue every 5 (five) minutes as needed for chest pain.    . potassium chloride SA (K-DUR,KLOR-CON) 20 MEQ tablet Take 1 tablet (20 mEq total) by mouth daily. 90 tablet 1  . rosuvastatin (CRESTOR) 5 MG tablet Take 1 tablet (5 mg total) by mouth daily. 90 tablet 1  . Tamsulosin HCl (FLOMAX) 0.4 MG CAPS Take 0.4 mg by mouth daily after supper.    . losartan (COZAAR) 25 MG tablet Take 1 tablet (25 mg total) by mouth daily. 90 tablet 3   No  facility-administered medications prior to visit.      Allergies:   Patient has no known allergies.   Social History   Social History  . Marital status: Married    Spouse name: N/A  . Number of children: N/A  . Years of education: N/A   Social History Main Topics  . Smoking status: Passive Smoke Exposure - Never Smoker  . Smokeless tobacco: Never Used     Comment: both parents smoked  . Alcohol use Yes     Comment: 3  . Drug use: No  . Sexual activity: Not Currently   Other Topics Concern  . None   Social History Narrative  . None     Family History:  The  patient's family history includes Emphysema in his father and mother.   ROS:   Please see the history of present illness.    Suspect a completed anterior infarction at the time of emergency surgery for totally occluded LAD but could not be opened with angioplasty. Time to reperfusion was certainly greater than 6 hours. There was a additional ischemic insult associated with cold cardioplegia and bypass. Physical activity functional status has remained stable for the past 8 years. No anginal complaints. Plan tenderness without difficulty.  All other systems reviewed and are negative.   PHYSICAL EXAM:   VS:  BP 106/70   Pulse (!) 56   Ht 5\' 10"  (1.778 m)   Wt 159 lb 6.4 oz (72.3 kg)   SpO2 97%   BMI 22.87 kg/m    GEN: Well nourished, well developed, in no acute distress  HEENT: normal  Neck: no JVD, carotid bruits, or masses Cardiac: RRR; no murmurs, rubs, or gallops,no edema  Respiratory:  clear to auscultation bilaterally, normal work of breathing GI: soft, nontender, nondistended, + BS MS: no deformity or atrophy  Skin: warm and dry, no rash Neuro:  Alert and Oriented x 3, Strength and sensation are intact Psych: euthymic mood, full affect  Wt Readings from Last 3 Encounters:  12/30/16 159 lb 6.4 oz (72.3 kg)  12/18/16 159 lb 6.4 oz (72.3 kg)  10/24/16 153 lb (69.4 kg)      Studies/Labs Reviewed:   EKG:  EKG  Septal Q waves V1 through V3 demonstrated almost recent EKG performed prior to the visit with Dr. Rayann Heman on 12/19/16.  Recent Labs: 09/03/2016: Hemoglobin 15.1; Platelets 207 10/17/2016: BUN 14; Creatinine, Ser 0.99; Magnesium 2.1; Potassium 4.0; Sodium 141   Lipid Panel    Component Value Date/Time   CHOL 114 05/22/2012 0221   TRIG 105 05/22/2012 0221   HDL 39 (L) 05/22/2012 0221   CHOLHDL 2.9 05/22/2012 0221   VLDL 21 05/22/2012 0221   LDLCALC 54 05/22/2012 0221    Additional studies/ records that were reviewed today include:  Cardiac CT with morphology,  09/12/16: This study was done pre-A. fib ablation and lead to the nuclear study done in December, noted below IMPRESSION: 1. Patent RCA and LCx. The proximal LAD is occluded. Patent SVG-D with some back-filling of LAD. LIMA-LAD is atretic.  2.  Pulmonary veins as above, no significant stenosis.  3.  No LA appendage thrombus noted.   Stress nuclear 10/24/16: Study Highlights    Nuclear stress EF: 40%.  Findings consistent with prior myocardial infarction.  This is a high risk study.  The left ventricular ejection fraction is moderately decreased (30-44%).  Defect 1: There is a defect present in the mid anterior, mid anteroseptal, mid anterolateral, apical anterior, apical septal  and apex location. Fixed, consistent with scar  No significant change from previous scan      ASSESSMENT:    1. Paroxysmal atrial fibrillation (HCC)   2. Coronary artery disease involving native coronary artery of native heart without angina pectoris   3. Chronic combined systolic and diastolic CHF, NYHA class 2 (HCC)      PLAN:  In order of problems listed above:  1. Paroxysmal atrial fibrillation seems to be well controlled now status post ablation. A fat LAD is also being continued. Probably won't be stopped until after the patient returns from a planned trip to after which will occur in August. 2. Coronary disease with evidence of a completed anterior wall infarction due to late reperfusion which was done by surgical approach. Mammary to the LAD is atretic noted by CT angio. Follow-up nuclear study demonstrated anterior wall scar without ischemia. No symptoms of angina. No exercise-induced ventricular ectopy. The circumflex and right coronary widely patent as is the saphenous vein graft to the first diagonal. Plan continue conservative medical management. 3.  LVEF estimated to be 40% by nuclear wall motion study. Now on low-dose beta blocker and ARB therapy. Blood pressure has always run  relatively low making further titration of medical therapy difficult.. Prior echocardiographic data suggests EF between 13 - 45% dating back as far as 2010. Anterior akinesis and apical akinesis have been consistently reported. Repeat echocardiogram in one year to ensure no worsening in LV function.   I do not feel that coronary angiography is indicated. He does have an anterior wall scar due to late reperfusion of the time of bypass. LVEF is stable. He is on low-dose heart failure therapy. Plan repeat the echo in one year prior to the next office visit.  Medication Adjustments/Labs and Tests Ordered: Current medicines are reviewed at length with the patient today.  Concerns regarding medicines are outlined above.  Medication changes, Labs and Tests ordered today are listed in the Patient Instructions below. There are no Patient Instructions on file for this visit.   Signed, Sinclair Grooms, MD  12/30/2016 4:41 PM    [facility] [address], [place], [place]  [address] Phone: [phone]; Fax: [phone]

## 2016-12-30 ENCOUNTER — Ambulatory Visit (INDEPENDENT_AMBULATORY_CARE_PROVIDER_SITE_OTHER): Payer: Medicare Other | Admitting: Interventional Cardiology

## 2016-12-30 ENCOUNTER — Encounter: Payer: Self-pay | Admitting: Interventional Cardiology

## 2016-12-30 VITALS — BP 106/70 | HR 56 | Ht 70.0 in | Wt 159.4 lb

## 2016-12-30 DIAGNOSIS — I2510 Atherosclerotic heart disease of native coronary artery without angina pectoris: Secondary | ICD-10-CM

## 2016-12-30 DIAGNOSIS — I480 Paroxysmal atrial fibrillation: Secondary | ICD-10-CM | POA: Diagnosis not present

## 2016-12-30 DIAGNOSIS — I5042 Chronic combined systolic (congestive) and diastolic (congestive) heart failure: Secondary | ICD-10-CM

## 2016-12-30 MED ORDER — LOSARTAN POTASSIUM 25 MG PO TABS: 25.0000 mg | ORAL_TABLET | Freq: Every day | ORAL | 3 refills | Status: DC

## 2016-12-30 NOTE — Patient Instructions (Signed)
Medication Instructions:  None  Labwork: None  Testing/Procedures: Your physician has requested that you have an echocardiogram anytime prior to 1 year follow  up. Echocardiography is a painless test that uses sound waves to create images of your heart. It provides your doctor with information about the size and shape of your heart and how well your heart's chambers and valves are working. This procedure takes approximately one hour. There are no restrictions for this procedure.    Follow-Up: Your physician wants you to follow-up in: 1 year with Dr. Tamala Julian. You will receive a reminder letter in the mail two months in advance. If you don't receive a letter, please call our office to schedule the follow-up appointment.   Any Other Special Instructions Will Be Listed Below (If Applicable).     If you need a refill on your cardiac medications before your next appointment, please call your pharmacy.

## 2017-01-06 MED FILL — KLOR-CON M20 TABLET: 20 | 30 days supply | Qty: 30 | Fill #6

## 2017-01-27 ENCOUNTER — Other Ambulatory Visit: Payer: Self-pay | Admitting: Interventional Cardiology

## 2017-01-27 MED FILL — DOFETILIDE 500 MCG CAPSULE: 500 | 90 days supply | Qty: 180 | Fill #4

## 2017-01-27 MED FILL — METOPROLOL TARTRATE 25 MG T: 25 | 90 days supply | Qty: 90 | Fill #0

## 2017-01-27 MED FILL — LOSARTAN POTASSIUM 25 MG TA: 25 | 90 days supply | Qty: 90 | Fill #2

## 2017-02-06 ENCOUNTER — Other Ambulatory Visit (HOSPITAL_COMMUNITY): Payer: Self-pay | Admitting: Nurse Practitioner

## 2017-02-06 MED FILL — KLOR-CON M20 TABLET: 20 | 90 days supply | Qty: 90 | Fill #0

## 2017-02-19 MED FILL — TAMSULOSIN HCL 0.4 MG CAP: 0.4 | 90 days supply | Qty: 90 | Fill #4

## 2017-02-21 ENCOUNTER — Encounter: Payer: Self-pay | Admitting: Internal Medicine

## 2017-03-11 MED FILL — ROSUVASTATIN CALCIUM 5 MG T: 5 | 90 days supply | Qty: 90 | Fill #1

## 2017-03-17 ENCOUNTER — Ambulatory Visit: Payer: Medicare Other | Admitting: Internal Medicine

## 2017-03-24 MED FILL — ELIQUIS 5 MG TABLET: 5 | 90 days supply | Qty: 180 | Fill #2

## 2017-04-21 ENCOUNTER — Encounter: Payer: Self-pay | Admitting: Internal Medicine

## 2017-04-24 MED FILL — DOFETILIDE 500 MCG CAPSULE: 500 | 30 days supply | Qty: 60 | Fill #5

## 2017-04-24 MED FILL — LOSARTAN POTASSIUM 25 MG TA: 25 | 90 days supply | Qty: 90 | Fill #3

## 2017-04-24 MED FILL — METOPROLOL TARTRATE 25 MG T: 25 | 90 days supply | Qty: 90 | Fill #1

## 2017-05-05 MED FILL — POTASSIUM CL ER 20 MEQ TABL: 20 | 90 days supply | Qty: 90 | Fill #1

## 2017-05-07 ENCOUNTER — Ambulatory Visit (INDEPENDENT_AMBULATORY_CARE_PROVIDER_SITE_OTHER): Payer: Medicare Other | Admitting: Internal Medicine

## 2017-05-07 ENCOUNTER — Encounter: Payer: Self-pay | Admitting: Internal Medicine

## 2017-05-07 VITALS — BP 100/70 | HR 51 | Ht 70.0 in | Wt 158.6 lb

## 2017-05-07 DIAGNOSIS — I480 Paroxysmal atrial fibrillation: Secondary | ICD-10-CM

## 2017-05-07 DIAGNOSIS — I2510 Atherosclerotic heart disease of native coronary artery without angina pectoris: Secondary | ICD-10-CM

## 2017-05-07 NOTE — Progress Notes (Signed)
PCP: Josetta Huddle, MD Primary Cardiologist: Dr Lamount Cohen Isaac [name] is a 66 y.o. male who presents today for routine electrophysiology followup.  Since last being seen in our clinic, the patient reports doing very well.   He is active.  Plans for a trip in August to [place] and does not wish to stop AAD therapy until he returns.  No afib since his last visit.  Today, he denies symptoms of palpitations, chest pain, shortness of breath,  lower extremity edema, dizziness, presyncope, or syncope.  The patient is otherwise without complaint today.   Past Medical History:  Diagnosis Date  . CERVICAL SPINE DISORDER, NOS   . Chronic systolic (congestive) heart failure (HCC)    EF 40%  . Coronary artery disease    a. s/p CABG  . 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 METATARSALGIA   . OSTEOARTHROSIS, LOCAL, SCND, HAND   . Paroxysmal atrial fibrillation [facility])    Past Surgical History:  Procedure Laterality Date  . APPENDECTOMY    . CHOLECYSTECTOMY    . CORONARY ARTERY BYPASS GRAFT  10/2009  . ELECTROPHYSIOLOGIC STUDY N/A 09/17/2016   Procedure: Atrial Fibrillation Ablation;  Surgeon: Thompson Grayer, MD;  Location: [place] [facility];  Service: Cardiovascular;  Laterality: N/A;    ROS- all systems are reviewed and negatives except as per HPI above  Current Outpatient Prescriptions  Medication Sig Dispense Refill  . acetaminophen (TYLENOL) 325 MG tablet Take 325 mg by mouth every 6 (six) hours as needed for mild pain.    Marland Kitchen apixaban (ELIQUIS) 5 MG TABS tablet TAKE 1 TABLET BY MOUTH 2 TIMES DAILY. 180 tablet 1  . atovaquone-proguanil (MALARONE) 250-100 MG TABS tablet Will start in August 2018    . cetirizine (ZYRTEC) 10 MG tablet Take 10 mg by mouth daily.    . cholecalciferol (VITAMIN D) 400 units TABS tablet Take 1,200 Units by mouth daily.     Marland Kitchen dofetilide (TIKOSYN) 500 MCG capsule Take 1 capsule (500 mcg total) by mouth 2 (two) times daily. 90 capsule 3  . KLOR-CON M20 20 MEQ tablet TAKE 1  TABLET BY MOUTH DAILY. 30 tablet 6  . losartan (COZAAR) 25 MG tablet Take 25 mg by mouth daily.    Marland Kitchen losartan (COZAAR) 25 MG tablet Take 1 tablet (25 mg total) by mouth daily. 90 tablet 3  . metoprolol tartrate (LOPRESSOR) 25 MG tablet Take 0.5 tablets (12.5 mg total) by mouth 2 (two) times daily. 90 tablet 1  . metoprolol tartrate (LOPRESSOR) 25 MG tablet TAKE 1/2 TABLET BY MOUTH TWICE DAILY 90 tablet 2  . nitroGLYCERIN (NITROSTAT) 0.4 MG SL tablet Place 0.4 mg under the tongue every 5 (five) minutes as needed for chest pain.    . potassium chloride SA (K-DUR,KLOR-CON) 20 MEQ tablet Take 1 tablet (20 mEq total) by mouth daily. 90 tablet 1  . rosuvastatin (CRESTOR) 5 MG tablet Take 1 tablet (5 mg total) by mouth daily. 90 tablet 1  . Tamsulosin HCl (FLOMAX) 0.4 MG CAPS Take 0.4 mg by mouth daily after supper.     No current facility-administered medications for this visit.     Physical Exam: Vitals:   05/07/17 1558  Height: 5\' 10"  (1.778 m)    GEN- The patient is well appearing, alert and oriented x 3 today.   Head- normocephalic, atraumatic Eyes-  Sclera clear, conjunctiva pink Ears- hearing intact Oropharynx- clear Lungs- Clear to ausculation bilaterally, normal work of breathing Heart- Regular rate  and rhythm, no murmurs, rubs or gallops, PMI not laterally displaced GI- soft, NT, ND, + BS Extremities- no clubbing, cyanosis, or edema  EKG tracing ordered today is personally reviewed and shows sinus rhythm 51 bpm, Qtc 438 msec  Assessment and Plan:  1. Paroxysmal atrial fibrillation No episodes post ablation He is reluctant to stop tikosyn but may stop it prior to his next visit (once he returns from [place]) On eliquis (chads2vasc score is 2) Bmet, Mg today Labs from 12/17 reviewed today  2. CAD No ischemic symptoms Dr Thompson Caul notes from 2/18 is reviewed Medical management advised  3. Chronic systolic dysfunction euvolemic Adequate hydration and heat avoidance is  encouraged  Follow-up with Dr Tamala Julian as scheduled I will see again in 6 months  Thompson Grayer MD, [facility] 05/07/2017 3:59 PM

## 2017-05-07 NOTE — Patient Instructions (Addendum)
Medication Instructions:  Your physician recommends that you continue on your current medications as directed. Please refer to the Current Medication list given to you today.   Labwork: None ordered   Testing/Procedures: Your physician recommends that you havelab work today: BMP/Mag    Follow-Up: Your physician wants you to follow-up in: 6 months with Dr Rayann Heman Dennis Bast will receive a reminder letter in the mail two months in advance. If you don't receive a letter, please call our office to schedule the follow-up appointment.   Any Other Special Instructions Will Be Listed Below (If Applicable).     If you need a refill on your cardiac medications before your next appointment, please call your pharmacy.

## 2017-05-08 ENCOUNTER — Encounter: Payer: Self-pay | Admitting: Internal Medicine

## 2017-05-08 LAB — BASIC METABOLIC PANEL
BUN / CREAT RATIO: 14 (ref 10–24)
BUN: 15 mg/dL (ref 8–27)
CO2: 22 mmol/L (ref 20–29)
CREATININE: 1.05 mg/dL (ref 0.76–1.27)
Calcium: 9.3 mg/dL (ref 8.6–10.2)
Chloride: 106 mmol/L (ref 96–106)
GFR calc Af Amer: 84 mL/min/{1.73_m2} (ref >59)
GFR, EST NON AFRICAN AMERICAN: 73 mL/min/{1.73_m2} (ref >59)
GLUCOSE: 93 mg/dL (ref 65–99)
Potassium: 4 mmol/L (ref 3.5–5.2)
Sodium: 142 mmol/L (ref 134–144)

## 2017-05-08 LAB — MAGNESIUM: MAGNESIUM: 2.1 mg/dL (ref 1.6–2.3)

## 2017-05-13 ENCOUNTER — Telehealth: Payer: Self-pay | Admitting: Internal Medicine

## 2017-05-13 NOTE — Telephone Encounter (Signed)
New message ° ° ° ° ° ° °Calling to get lab results °

## 2017-05-13 NOTE — Telephone Encounter (Signed)
Spoke with pt, aware of lab results. 

## 2017-05-19 MED FILL — CEFDINIR 300 MG CAPSULE: 300 | 7 days supply | Qty: 14 | Fill #0

## 2017-05-20 MED FILL — TAMSULOSIN HCL 0.4 MG CAP: 0.4 | 90 days supply | Qty: 90 | Fill #0

## 2017-05-22 ENCOUNTER — Other Ambulatory Visit: Payer: Self-pay | Admitting: *Deleted

## 2017-05-22 ENCOUNTER — Other Ambulatory Visit: Payer: Self-pay | Admitting: Internal Medicine

## 2017-05-22 MED ORDER — DOFETILIDE 500 MCG PO CAPS: 500.0000 ug | ORAL_CAPSULE | Freq: Two times a day (BID) | ORAL | 3 refills | Status: DC

## 2017-05-22 MED FILL — DOFETILIDE 500 MCG CAPSULE: 500 | 90 days supply | Qty: 180 | Fill #0

## 2017-05-22 NOTE — Telephone Encounter (Signed)
Pt's medication was sent to pt's pharmacy as requested. Confirmation received.  °

## 2017-05-27 ENCOUNTER — Other Ambulatory Visit: Payer: Self-pay | Admitting: Interventional Cardiology

## 2017-06-02 ENCOUNTER — Other Ambulatory Visit: Payer: Self-pay | Admitting: Interventional Cardiology

## 2017-06-02 MED FILL — ROSUVASTATIN CALCIUM 5 MG T: 5 | 90 days supply | Qty: 90 | Fill #0

## 2017-06-02 MED FILL — ELIQUIS 5 MG TABLET: 5 | 90 days supply | Qty: 180 | Fill #0

## 2017-06-07 IMAGING — CT CT HEART MORPH/PULM VEIN W/ CM & W/O CA SCORE
1 of 10 series · 1 of 20 positions shown, 2 images · non-contrast
Comparison: Chest radiograph 05/21/2012

CLINICAL DATA: Pre-atrial fibrillation ablation

EXAM:
Cardiac CTA
MEDICATIONS:
Sub lingual nitro. 4mg
TECHNIQUE: The patient was scanned on a [employer] [REDACTED]ice scanner. Gantry
rotation speed was 270 msecs. Collimation was .9mm. A 100 kV
prospective scan was triggered in the descending thoracic aorta at
111 HU's with 5% padding centered around 78% of the R-R interval.
Average HR during the scan was 60 bpm. The 3D data set was
interpreted on a dedicated work station using MPR, MIP and VRT
modes. A total of 80cc of contrast was used.

[Series 300: locator · axial · 0.35mm/px · z∈[-186,-186]mm · 1 of 1 slices shown, 2 images]
[im 1/1  vessel]
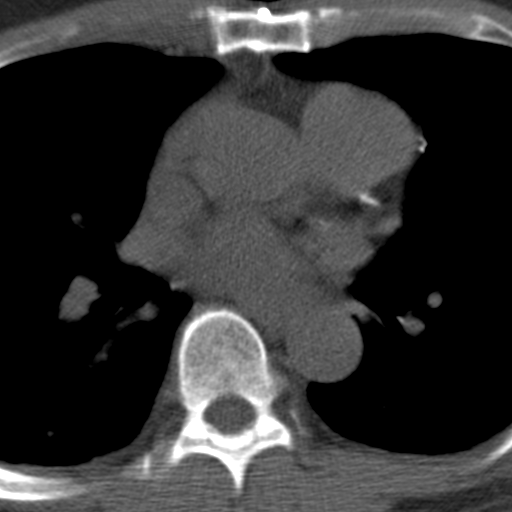
[im 1/1  lung]
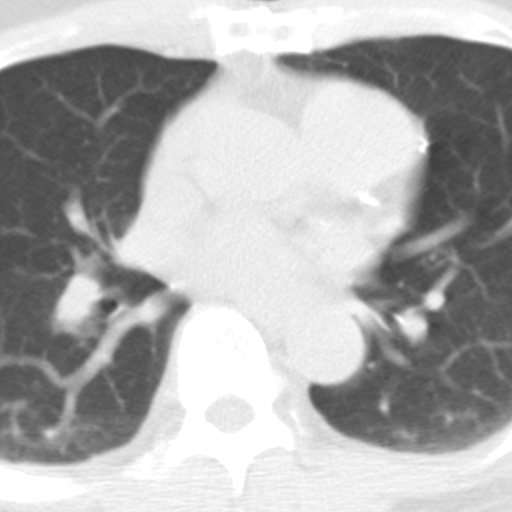

[1 of 20 positions shown; findings below may reference images not displayed]

FINDINGS: Non-cardiac: See separate report from [REDACTED].

Coronary Arteries: Right dominant with no anomalies

LM:  Short, no significant disease.

LAD system: The proximal LAD appears to be totally occluded. There
is a patent saphenous vein graft to a large diagonal. The [REDACTED]-LAD
appears to be atretic. The distal LAD does fill though it is a small
vessel. It appears to get flow from the grafted diagonal.

Circumflex system: Mixed plaque with mild stenosis in the proximal
LCx. The LCx system is relatively small.

RCA:  Mixed plaque with mild stenosis in the proximal and mid RCA.

There does not appear to be thrombus in the LA appendage.

Pulmonary veins:

Right upper PV 16 x 16 mm

Right middle PV 12 x 9 mm

Right lower PV 15 x 13 mm

Left upper PV 21 x 11 mm

Left lower PV 20 x 8 mm
IMPRESSION: 1. Patent RCA and LCx. The proximal LAD is occluded. Patent MEHRI
with some back-filling of LAD. [REDACTED]-LAD is atretic.

2.  Pulmonary veins as above, no significant stenosis.

3.  No LA appendage thrombus noted.

Dinka Henri

EXAM:
OVER-READ INTERPRETATION  CT CHEST

The following report is an over-read performed by radiologist Dr.
does not include interpretation of cardiac or coronary anatomy or
pathology. The coronary calcium score/coronary CTA interpretation by
the cardiologist is attached.
FINDINGS: Lungs are clear. There is minimal pleural thickening along the right
major fissure on sequence 204, image 32. No significant pleural
effusions. There is no significant mediastinal or hilar
lymphadenopathy. Patient has undergone a CABG procedure. Images of
the upper abdomen are unremarkable. No acute bone abnormality.
IMPRESSION: Unremarkable over-read study.

## 2017-07-22 MED FILL — LOSARTAN POTASSIUM 25 MG TA: 25 | 90 days supply | Qty: 90 | Fill #0

## 2017-07-25 MED FILL — METOPROLOL TARTRATE 25 MG T: 25 | 90 days supply | Qty: 90 | Fill #2

## 2017-08-04 MED FILL — POTASSIUM CL ER 20 MEQ TABL: 20 | 90 days supply | Qty: 90 | Fill #2

## 2017-08-11 ENCOUNTER — Telehealth: Payer: Self-pay | Admitting: Internal Medicine

## 2017-08-11 DIAGNOSIS — I480 Paroxysmal atrial fibrillation: Secondary | ICD-10-CM

## 2017-08-11 NOTE — Telephone Encounter (Signed)
°  New Prob  Pt has some questions regarding his Potassium medication. Please call.

## 2017-08-11 NOTE — Telephone Encounter (Signed)
Pt states he was originally starting on K+ because he was placed on Tikosyn.  Pt states when he last saw Dr. Rayann Heman he mentioned stopping Tikosyn when pt ran out of current supply.  Pt has 2 weeks worth left.  He wanted to ask about stopping Tikosyn and to see if he should stop K+ as well?  Advised I would send message to Dr. Rayann Heman and his nurse for follow up.

## 2017-08-11 NOTE — Telephone Encounter (Signed)
Discussed with Dr Rayann Heman.  He says okay to stop both Tikosyn and Potassium.  Spoke with patient and will take last dose of Tikosyn and Potassium on 08/30/17. Then per Dr Rayann Heman will need to have labs one week after stopping. He will come in on10/29/18 for labs at 11:00am

## 2017-08-19 MED FILL — TAMSULOSIN HCL 0.4 MG CAP: 0.4 | 90 days supply | Qty: 90 | Fill #1

## 2017-09-08 ENCOUNTER — Other Ambulatory Visit: Payer: Medicare Other

## 2017-09-08 DIAGNOSIS — I480 Paroxysmal atrial fibrillation: Secondary | ICD-10-CM

## 2017-09-08 LAB — BASIC METABOLIC PANEL
BUN / CREAT RATIO: 15 (ref 10–24)
BUN: 15 mg/dL (ref 8–27)
CO2: 20 mmol/L (ref 20–29)
Calcium: 9.3 mg/dL (ref 8.6–10.2)
Chloride: 107 mmol/L — ABNORMAL HIGH (ref 96–106)
Creatinine, Ser: 0.98 mg/dL (ref 0.76–1.27)
GFR calc Af Amer: 91 mL/min/{1.73_m2} (ref >59)
GFR calc non Af Amer: 79 mL/min/{1.73_m2} (ref >59)
GLUCOSE: 113 mg/dL — AB (ref 65–99)
POTASSIUM: 4.2 mmol/L (ref 3.5–5.2)
SODIUM: 144 mmol/L (ref 134–144)

## 2017-09-09 MED FILL — ROSUVASTATIN CALCIUM 5 MG T: 5 | 90 days supply | Qty: 90 | Fill #1

## 2017-09-15 ENCOUNTER — Telehealth: Payer: Self-pay | Admitting: Internal Medicine

## 2017-09-15 NOTE — Telephone Encounter (Signed)
Did not need this encountger

## 2017-09-22 MED FILL — ELIQUIS 5 MG TABLET: 5 | 90 days supply | Qty: 180 | Fill #1

## 2017-10-22 ENCOUNTER — Other Ambulatory Visit: Payer: Self-pay | Admitting: Interventional Cardiology

## 2017-10-22 MED FILL — LOSARTAN POTASSIUM 25 MG TA: 25 | 90 days supply | Qty: 90 | Fill #1

## 2017-10-22 MED FILL — METOPROLOL TARTRATE 25 MG T: 25 | 90 days supply | Qty: 90 | Fill #0

## 2017-10-29 ENCOUNTER — Encounter: Payer: Self-pay | Admitting: Internal Medicine

## 2017-10-29 ENCOUNTER — Ambulatory Visit: Payer: Medicare Other | Admitting: Internal Medicine

## 2017-10-29 VITALS — BP 110/74 | HR 57 | Ht 70.0 in | Wt 159.0 lb

## 2017-10-29 DIAGNOSIS — I480 Paroxysmal atrial fibrillation: Secondary | ICD-10-CM | POA: Diagnosis not present

## 2017-10-29 NOTE — Patient Instructions (Signed)
Medication Instructions:  Your physician recommends that you continue on your current medications as directed. Please refer to the Current Medication list given to you today.   Labwork: None ordered   Testing/Procedures: None ordered   Follow-Up: Your physician wants you to follow-up in: August 2019 with Dr Rayann Heman Dennis Bast will receive a reminder letter in the mail two months in advance. If you don't receive a letter, please call our office to schedule the follow-up appointment.     Any Other Special Instructions Will Be Listed Below (If Applicable).     If you need a refill on your cardiac medications before your next appointment, please call your pharmacy.

## 2017-10-29 NOTE — Progress Notes (Signed)
PCP: Josetta Huddle, MD Primary Cardiologist: Dr Tamala Julian Primary EP: Dr Rayann Heman  Isaac [name] is a 66 y.o. male who presents today for routine electrophysiology followup.  Since last being seen in our clinic, the patient reports doing very well.  Volunteers at the [employer] with his wife.  Today, he denies symptoms of palpitations, chest pain, shortness of breath,  lower extremity edema, dizziness, presyncope, or syncope.  The patient is otherwise without complaint today.   Past Medical History:  Diagnosis Date  . CERVICAL SPINE DISORDER, NOS   . Chronic systolic (congestive) heart failure (HCC)    EF 40%  . Coronary artery disease    a. s/p CABG  . 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 METATARSALGIA   . OSTEOARTHROSIS, LOCAL, SCND, HAND   . Paroxysmal atrial fibrillation [facility])    Past Surgical History:  Procedure Laterality Date  . APPENDECTOMY    . CHOLECYSTECTOMY    . CORONARY ARTERY BYPASS GRAFT  10/2009  . ELECTROPHYSIOLOGIC STUDY N/A 09/17/2016   Procedure: Atrial Fibrillation Ablation;  Surgeon: Thompson Grayer, MD;  Location: [place] [facility];  Service: Cardiovascular;  Laterality: N/A;    ROS- all systems are reviewed and negatives except as per HPI above  Current Outpatient Medications  Medication Sig Dispense Refill  . acetaminophen (TYLENOL) 325 MG tablet Take 325 mg by mouth every 6 (six) hours as needed for mild pain.    Marland Kitchen apixaban (ELIQUIS) 5 MG TABS tablet TAKE 1 TABLET BY MOUTH 2 TIMES DAILY. 180 tablet 1  . cetirizine (ZYRTEC) 10 MG tablet Take 10 mg by mouth daily.    . cholecalciferol (VITAMIN D) 400 units TABS tablet Take 1,200 Units by mouth daily.     Marland Kitchen losartan (COZAAR) 25 MG tablet Take 25 mg by mouth daily.    . metoprolol tartrate (LOPRESSOR) 25 MG tablet Take 0.5 tablets (12.5 mg total) by mouth 2 (two) times daily. 90 tablet 0  . nitroGLYCERIN (NITROSTAT) 0.4 MG SL tablet Place 0.4 mg under the tongue every 5 (five) minutes as needed for chest pain.    .  rosuvastatin (CRESTOR) 5 MG tablet Take 1 tablet (5 mg total) by mouth daily. 90 tablet 2  . Tamsulosin HCl (FLOMAX) 0.4 MG CAPS Take 0.4 mg by mouth daily after supper.     No current facility-administered medications for this visit.     Physical Exam: Vitals:   10/29/17 1012  BP: 110/74  Pulse: (!) 57  SpO2: 98%  Weight: 159 lb (72.1 kg)  Height: 5\' 10"  (1.778 m)    GEN- The patient is well appearing, alert and oriented x 3 today.   Head- normocephalic, atraumatic Eyes-  Sclera clear, conjunctiva pink Ears- hearing intact Oropharynx- clear Lungs- Clear to ausculation bilaterally, normal work of breathing Heart- Regular rate and rhythm, no murmurs, rubs or gallops, PMI not laterally displaced GI- soft, NT, ND, + BS Extremities- no clubbing, cyanosis, or edema  EKG tracing ordered today is personally reviewed and shows sinus rhythm 57 bpm, otherwise normal ekg  Assessment and Plan:  1. Paroxysmal afib No afib post ablation off tikosyn Pleased with results. On eliquis for chads2vasc score of 2  2. CAD No ischemic symptoms No changes  3. Chronic systolic dysfunction Echo ordered by Dr Tamala Julian for January  Follow-up with Dr Tamala Julian in February as scheduled I will see again 6 months after visit with Dr Tamala Julian.  He will contact AF clinic with any problems in the  interim.  Thompson Grayer MD, [facility] 10/29/2017 10:30 AM

## 2017-11-05 MED FILL — TAMSULOSIN HCL 0.4 MG CAP: 0.4 | 90 days supply | Qty: 90 | Fill #2

## 2017-11-27 ENCOUNTER — Other Ambulatory Visit: Payer: Self-pay

## 2017-11-27 ENCOUNTER — Ambulatory Visit (HOSPITAL_COMMUNITY): Payer: Medicare Other | Attending: Cardiovascular Disease

## 2017-11-27 DIAGNOSIS — I051 Rheumatic mitral insufficiency: Secondary | ICD-10-CM | POA: Diagnosis not present

## 2017-11-27 DIAGNOSIS — I5042 Chronic combined systolic (congestive) and diastolic (congestive) heart failure: Secondary | ICD-10-CM | POA: Insufficient documentation

## 2017-11-27 DIAGNOSIS — I420 Dilated cardiomyopathy: Secondary | ICD-10-CM | POA: Insufficient documentation

## 2017-11-28 ENCOUNTER — Telehealth: Payer: Self-pay | Admitting: Interventional Cardiology

## 2017-11-28 NOTE — Telephone Encounter (Signed)
New Message   Patient is returning call. Please call.

## 2017-11-28 NOTE — Telephone Encounter (Signed)
Informed pt of echo results.  Pt verbalized understanding and was appreciative for call.

## 2017-12-08 MED FILL — ROSUVASTATIN CALCIUM 5 MG T: 5 | 90 days supply | Qty: 90 | Fill #2

## 2017-12-09 ENCOUNTER — Telehealth: Payer: Self-pay | Admitting: Nurse Practitioner

## 2017-12-09 NOTE — Telephone Encounter (Signed)
   [facility] Pre-operative Risk Assessment    Request for surgical clearance:  1. What type of surgery is being performed? Colonoscopy   2. When is this surgery scheduled? 01/06/18   3. What type of clearance is required (medical clearance vs. Pharmacy clearance to hold med vs. Both)? Pharmacy  4. Are there any medications that need to be held prior to surgery and how long? Eliquis - Can patient stop Eliquis the night before and morning of the procedure   5. Practice name and name of physician performing surgery? [facility], Endoscopy/Dr. Buccini   6. What is your office phone and fax number?  Phone: [phone], Fax: [phone]   7. Anesthesia type (None, local, MAC, general) ? Not Indicated   Isaac [name] 12/09/2017, 4:00 PM  _________________________________________________________________   (provider comments below)

## 2017-12-10 NOTE — Telephone Encounter (Signed)
Pt takes Eliquis for afib with CHADS2VASc score of 3 (age, CAD, CHF). Renal function is normal. Ok to hold Eliquis the night before and morning of the procedure as requested.

## 2017-12-10 NOTE — Telephone Encounter (Signed)
Spoke with Arbie Cookey - states that office also inquired as to when pt can resume Eliquis if polyps are removed. Generally prefer that patients resume within 24 hours after procedure, however would be ok with holding up to 48 hours after procedure. If longer is required, would recommend that [facility] follow up with [place] after procedure.

## 2017-12-10 NOTE — Telephone Encounter (Signed)
Lmtcb to discuss with the pt as to when he needs to hold Eliquis.

## 2017-12-10 NOTE — Telephone Encounter (Signed)
I s/w Dr. Buccini's office and advised pt has been cleared and will need to hold Eliquis the night before procedure and the morning of procedure. I was then asked by Dr. Osborn Coho office if they remove polyps when can the pt resume Eliquis. I advised I will d/w our Pharm D for further advice, see Megan Supple, Pharm-D recommendation. I will also call the pt to let him know when to hold the Eliquis as well as I will fax clearance with recommendations to Dr. Osborn Coho office.

## 2017-12-10 NOTE — Telephone Encounter (Signed)
Pt called back and has been advised as to hold the Eliquis the night before procedure as well as the morning of procedure. Pt was thankful for my call. Pt aware clearance with recommendations have been sent to Dr. Osborn Coho office today as well.

## 2017-12-16 MED FILL — ELIQUIS 5 MG TABLET: 5 | 90 days supply | Qty: 180 | Fill #2

## 2017-12-30 MED FILL — PEG-3350 SOLUTION: 420 | 1 days supply | Qty: 4000 | Fill #0

## 2017-12-30 NOTE — Progress Notes (Signed)
Cardiology Office Note    Date:  12/31/2017   ID:  Desman, Polak 1950-11-19, MRN [id number]  PCP:  Josetta Huddle, MD  Cardiologist: Sinclair Grooms, MD   Chief Complaint  Patient presents with  . Coronary Artery Disease  . Atrial Fibrillation    History of Present Illness:  Isaac [name] is a 67 y.o. male  with a h/o CAD/s/p bypass 2010, paroxsymal afib that had ablation 11/17, after failing tikosyn to control rhythm. Interval Cardiac CT showed atretic LAD and non-ischemic nuclear study 2017.   Isaac [name] is doing well.  Recent trip to [place] was uneventful.  Much hiking without dyspnea or chest pain.  He denies dyspnea or limitations while playing tennis on a regular basis.  He has not had palpitations or syncope.  There is no peripheral edema.  He has not had palpitations, near syncope, or episodes of atrial fibrillation.  No transient neurological complaints.   Past Medical History:  Diagnosis Date  . CERVICAL SPINE DISORDER, NOS   . Chronic systolic (congestive) heart failure (HCC)    EF 40%  . Coronary artery disease    a. s/p CABG  . 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 METATARSALGIA   . OSTEOARTHROSIS, LOCAL, SCND, HAND   . Paroxysmal atrial fibrillation [facility])     Past Surgical History:  Procedure Laterality Date  . APPENDECTOMY    . CHOLECYSTECTOMY    . CORONARY ARTERY BYPASS GRAFT  10/2009  . ELECTROPHYSIOLOGIC STUDY N/A 09/17/2016   Procedure: Atrial Fibrillation Ablation;  Surgeon: Thompson Grayer, MD;  Location: [place] [facility];  Service: Cardiovascular;  Laterality: N/A;    Current Medications: Outpatient Medications Prior to Visit  Medication Sig Dispense Refill  . acetaminophen (TYLENOL) 325 MG tablet Take 325 mg by mouth every 6 (six) hours as needed for mild pain.    Marland Kitchen apixaban (ELIQUIS) 5 MG TABS tablet TAKE 1 TABLET BY MOUTH 2 TIMES DAILY. 180 tablet 1  . cetirizine (ZYRTEC) 10 MG tablet Take 10 mg by mouth daily.    . cholecalciferol  (VITAMIN D) 400 units TABS tablet Take 1,200 Units by mouth daily.     Marland Kitchen losartan (COZAAR) 25 MG tablet Take 25 mg by mouth daily.    . metoprolol tartrate (LOPRESSOR) 25 MG tablet Take 0.5 tablets (12.5 mg total) by mouth 2 (two) times daily. 90 tablet 0  . nitroGLYCERIN (NITROSTAT) 0.4 MG SL tablet Place 0.4 mg under the tongue every 5 (five) minutes as needed for chest pain.    . rosuvastatin (CRESTOR) 5 MG tablet Take 1 tablet (5 mg total) by mouth daily. 90 tablet 2  . Tamsulosin HCl (FLOMAX) 0.4 MG CAPS Take 0.4 mg by mouth daily after supper.     No facility-administered medications prior to visit.      Allergies:   Patient has no known allergies.   Social History   Socioeconomic History  . Marital status: Married    Spouse name: None  . Number of children: None  . Years of education: None  . Highest education level: None  Social Needs  . Financial resource strain: None  . Food insecurity - worry: None  . Food insecurity - inability: None  . Transportation needs - medical: None  . Transportation needs - non-medical: None  Occupational History  . None  Tobacco Use  . Smoking status: Passive Smoke Exposure - Never Smoker  . Smokeless tobacco: Never Used  . Tobacco comment:  both parents smoked  Substance and Sexual Activity  . Alcohol use: Yes    Comment: 3  . Drug use: No  . Sexual activity: Not Currently  Other Topics Concern  . None  Social History Narrative  . None     Family History:  The patient's family history includes Emphysema in his father and mother.   ROS:   Please see the history of present illness.    None  All other systems reviewed and are negative.   PHYSICAL EXAM:   VS:  BP 108/72   Pulse 61   Ht 5\' 10"  (1.778 m)   Wt 159 lb 12.8 oz (72.5 kg)   BMI 22.93 kg/m    GEN: Well nourished, well developed, in no acute distress  HEENT: normal  Neck: no JVD, carotid bruits, or masses. Cardiac: RRR; no murmurs, rubs, or gallops,no edema    Respiratory:  clear to auscultation bilaterally, normal work of breathing GI: soft, nontender, nondistended, + BS MS: no deformity or atrophy  Skin: warm and dry, no rash Neuro:  Alert and Oriented x 3, Strength and sensation are intact Psych: euthymic mood, full affect  Wt Readings from Last 3 Encounters:  12/31/17 159 lb 12.8 oz (72.5 kg)  10/29/17 159 lb (72.1 kg)  05/07/17 158 lb 9.6 oz (71.9 kg)      Studies/Labs Reviewed:   EKG:  EKG October 29, 2017 anteroseptal infarction.  Sinus bradycardia.  No change when compared to prior tracings.  Recent Labs: 05/07/2017: Magnesium 2.1 09/08/2017: BUN 15; Creatinine, Ser 0.98; Potassium 4.2; Sodium 144   Lipid Panel    Component Value Date/Time   CHOL 114 05/22/2012 0221   TRIG 105 05/22/2012 0221   HDL 39 (L) 05/22/2012 0221   CHOLHDL 2.9 05/22/2012 0221   VLDL 21 05/22/2012 0221   LDLCALC 54 05/22/2012 0221    Additional studies/ records that were reviewed today include:  Echocardiogram January 2019: Study Conclusions   - Left ventricle: The cavity size was normal. Wall thickness was   normal. Systolic function was mildly to moderately reduced. The   estimated ejection fraction was in the range of 40% to 45%.   Features are consistent with a pseudonormal left ventricular   filling pattern, with concomitant abnormal relaxation and   increased filling pressure (grade 2 diastolic dysfunction). - Mitral valve: There was mild regurgitation. - Right atrium: The atrium was mildly dilated.     ASSESSMENT:    1. Paroxysmal atrial fibrillation (HCC)   2. Chronic combined systolic and diastolic CHF, NYHA class 2 ([place])   3. Coronary artery disease involving coronary bypass graft of native heart with angina pectoris ([place])   4. Hyperlipidemia LDL goal <70   5. Chronic anticoagulation      PLAN:  In order of problems listed above:  1. No recurrence of atrial fibrillation since ablation.  Isaac [name] has been discontinued  now for several months. 2. Improvement in LV systolic function noted earlier this year on follow-up echo.  EF is in the 45% range.  No heart failure symptoms exists. 3. Significant physical activity without dyspnea or chest discomfort to suggest angina.  No functional testing indicated at this time.  Continue with active lifestyle. 4. LDL target less than 70.  Check lipid panel and liver panel today. 5. No current bleeding complications.  Some nuisance bleeding when shaving due to apixaban.  Clinical follow-up in 1 year.  Maintain an active lifestyle.  Call of clinical problems.  Medication Adjustments/Labs and Tests Ordered: Current medicines are reviewed at length with the patient today.  Concerns regarding medicines are outlined above.  Medication changes, Labs and Tests ordered today are listed in the Patient Instructions below. Patient Instructions  Medication Instructions:  Your physician recommends that you continue on your current medications as directed. Please refer to the Current Medication list given to you today.  Labwork: Lipid and liver today  Testing/Procedures: Your physician has requested that you have a carotid duplex anytime prior to follow up. This test is an ultrasound of the carotid arteries in your neck. It looks at blood flow through these arteries that supply the brain with blood. Allow one hour for this exam. There are no restrictions or special instructions.    Follow-Up: Your physician wants you to follow-up in: 1 year with Dr. Tamala Julian.   You will receive a reminder letter in the mail two months in advance. If you don't receive a letter, please call our office to schedule the follow-up appointment.   Any Other Special Instructions Will Be Listed Below (If Applicable).     If you need a refill on your cardiac medications before your next appointment, please call your pharmacy.      Signed, Sinclair Grooms, MD  12/31/2017 11:16 AM    [facility] [place], [place], [place]  [address] Phone: [phone]; Fax: [phone]

## 2017-12-31 ENCOUNTER — Ambulatory Visit: Payer: Medicare Other | Admitting: Interventional Cardiology

## 2017-12-31 ENCOUNTER — Encounter: Payer: Self-pay | Admitting: Interventional Cardiology

## 2017-12-31 VITALS — BP 108/72 | HR 61 | Ht 70.0 in | Wt 159.8 lb

## 2017-12-31 DIAGNOSIS — I5042 Chronic combined systolic (congestive) and diastolic (congestive) heart failure: Secondary | ICD-10-CM

## 2017-12-31 DIAGNOSIS — E785 Hyperlipidemia, unspecified: Secondary | ICD-10-CM

## 2017-12-31 DIAGNOSIS — I480 Paroxysmal atrial fibrillation: Secondary | ICD-10-CM | POA: Diagnosis not present

## 2017-12-31 DIAGNOSIS — Z7901 Long term (current) use of anticoagulants: Secondary | ICD-10-CM

## 2017-12-31 DIAGNOSIS — I25709 Atherosclerosis of coronary artery bypass graft(s), unspecified, with unspecified angina pectoris: Secondary | ICD-10-CM

## 2017-12-31 LAB — HEPATIC FUNCTION PANEL
ALT: 20 IU/L (ref 0–44)
AST: 26 IU/L (ref 0–40)
Albumin: 4.5 g/dL (ref 3.6–4.8)
Alkaline Phosphatase: 77 IU/L (ref 39–117)
BILIRUBIN TOTAL: 0.6 mg/dL (ref 0.0–1.2)
Bilirubin, Direct: 0.17 mg/dL (ref 0.00–0.40)
Total Protein: 6.8 g/dL (ref 6.0–8.5)

## 2017-12-31 LAB — LIPID PANEL
CHOL/HDL RATIO: 2.6 ratio (ref 0.0–5.0)
Cholesterol, Total: 125 mg/dL (ref 100–199)
HDL: 48 mg/dL (ref >39)
LDL CALC: 60 mg/dL (ref 0–99)
TRIGLYCERIDES: 86 mg/dL (ref 0–149)
VLDL CHOLESTEROL CAL: 17 mg/dL (ref 5–40)

## 2017-12-31 NOTE — Patient Instructions (Signed)
Medication Instructions:  Your physician recommends that you continue on your current medications as directed. Please refer to the Current Medication list given to you today.  Labwork: Lipid and liver today  Testing/Procedures: Your physician has requested that you have a carotid duplex anytime prior to follow up. This test is an ultrasound of the carotid arteries in your neck. It looks at blood flow through these arteries that supply the brain with blood. Allow one hour for this exam. There are no restrictions or special instructions.    Follow-Up: Your physician wants you to follow-up in: 1 year with Dr. Tamala Julian.   You will receive a reminder letter in the mail two months in advance. If you don't receive a letter, please call our office to schedule the follow-up appointment.   Any Other Special Instructions Will Be Listed Below (If Applicable).     If you need a refill on your cardiac medications before your next appointment, please call your pharmacy.

## 2018-01-19 ENCOUNTER — Other Ambulatory Visit: Payer: Self-pay | Admitting: Interventional Cardiology

## 2018-01-21 MED FILL — METOPROLOL TARTRATE 25 MG T: 25 | 90 days supply | Qty: 90 | Fill #0

## 2018-01-21 MED FILL — LOSARTAN POTASSIUM 25 MG TA: 25 | 90 days supply | Qty: 90 | Fill #0

## 2018-02-11 MED FILL — TAMSULOSIN HCL 0.4 MG CAP: 0.4 | 90 days supply | Qty: 90 | Fill #3

## 2018-03-04 ENCOUNTER — Other Ambulatory Visit: Payer: Self-pay | Admitting: Interventional Cardiology

## 2018-03-04 MED FILL — ROSUVASTATIN CALCIUM 5 MG T: 5 | 90 days supply | Qty: 90 | Fill #0

## 2018-03-17 MED FILL — ELIQUIS 5 MG TABLET: 5 | 90 days supply | Qty: 180 | Fill #3

## 2018-04-20 MED FILL — METOPROLOL TARTRATE 25 MG T: 25 | 90 days supply | Qty: 90 | Fill #1

## 2018-04-20 MED FILL — LOSARTAN POTASSIUM 25 MG TA: 25 | 90 days supply | Qty: 90 | Fill #1

## 2018-05-13 MED FILL — TAMSULOSIN HCL 0.4 MG CAP: 0.4 | 90 days supply | Qty: 90 | Fill #4

## 2018-06-04 MED FILL — ROSUVASTATIN CALCIUM 5 MG T: 5 | 90 days supply | Qty: 90 | Fill #1

## 2018-06-15 ENCOUNTER — Other Ambulatory Visit: Payer: Self-pay | Admitting: Internal Medicine

## 2018-06-15 MED FILL — ELIQUIS 5 MG TABLET: 5 | 90 days supply | Qty: 180 | Fill #0

## 2018-06-15 NOTE — Telephone Encounter (Signed)
Eliquis 5mg  refill request received; pt is 67 yrs old, wt-72.5kg, Crea-0.98 on 09/08/17, last seen by Dr. Tamala Julian on 12/31/17; will send in refill to requested pharmacy.

## 2018-06-29 ENCOUNTER — Ambulatory Visit: Payer: Medicare Other | Admitting: Internal Medicine

## 2018-06-29 ENCOUNTER — Encounter: Payer: Self-pay | Admitting: Internal Medicine

## 2018-06-29 VITALS — BP 110/66 | HR 59 | Ht 70.0 in | Wt 159.8 lb

## 2018-06-29 DIAGNOSIS — I5022 Chronic systolic (congestive) heart failure: Secondary | ICD-10-CM

## 2018-06-29 DIAGNOSIS — I2510 Atherosclerotic heart disease of native coronary artery without angina pectoris: Secondary | ICD-10-CM

## 2018-06-29 DIAGNOSIS — I480 Paroxysmal atrial fibrillation: Secondary | ICD-10-CM | POA: Diagnosis not present

## 2018-06-29 NOTE — Addendum Note (Signed)
Addended by: Willeen Cass A on: 06/29/2018 04:00 PM   Modules accepted: Orders

## 2018-06-29 NOTE — Patient Instructions (Signed)

## 2018-06-29 NOTE — Progress Notes (Signed)
PCP: Josetta Huddle, MD Primary Cardiologist: Dr Tamala Julian Primary EP: Dr Rayann Heman  Isaac [name] is a 67 y.o. male who presents today for routine electrophysiology followup.  Since last being seen in our clinic, the patient reports doing very well.  Today, he denies symptoms of palpitations, chest pain, shortness of breath,  lower extremity edema, dizziness, presyncope, or syncope.  The patient is otherwise without complaint today.   Past Medical History:  Diagnosis Date  . CERVICAL SPINE DISORDER, NOS   . Chronic systolic (congestive) heart failure (HCC)    EF 40%  . Coronary artery disease    a. s/p CABG  . 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 METATARSALGIA   . OSTEOARTHROSIS, LOCAL, SCND, HAND   . Paroxysmal atrial fibrillation [facility])    Past Surgical History:  Procedure Laterality Date  . APPENDECTOMY    . CHOLECYSTECTOMY    . CORONARY ARTERY BYPASS GRAFT  10/2009  . ELECTROPHYSIOLOGIC STUDY N/A 09/17/2016   Procedure: Atrial Fibrillation Ablation;  Surgeon: Thompson Grayer, MD;  Location: [place] [facility];  Service: Cardiovascular;  Laterality: N/A;    ROS- all systems are reviewed and negatives except as per HPI above  Current Outpatient Medications  Medication Sig Dispense Refill  . acetaminophen (TYLENOL) 325 MG tablet Take 325 mg by mouth every 6 (six) hours as needed for mild pain.    Marland Kitchen apixaban (ELIQUIS) 5 MG TABS tablet TAKE 1 TABLET BY MOUTH 2 TIMES DAILY. 180 tablet 1  . cetirizine (ZYRTEC) 10 MG tablet Take 10 mg by mouth daily.    . cholecalciferol (VITAMIN D) 400 units TABS tablet Take 1,200 Units by mouth daily.     Marland Kitchen ELIQUIS 5 MG TABS tablet TAKE 1 TABLET BY MOUTH 2 TIMES DAILY. 180 tablet 1  . losartan (COZAAR) 25 MG tablet TAKE 1 TABLET BY MOUTH DAILY. 90 tablet 3  . metoprolol tartrate (LOPRESSOR) 25 MG tablet TAKE 1/2 TABLET BY MOUTH TWICE A DAY 90 tablet 3  . nitroGLYCERIN (NITROSTAT) 0.4 MG SL tablet Place 0.4 mg under the tongue every 5 (five) minutes as  needed for chest pain.    . rosuvastatin (CRESTOR) 5 MG tablet TAKE 1 TABLET (5 MG TOTAL) BY MOUTH DAILY. 90 tablet 2  . Tamsulosin HCl (FLOMAX) 0.4 MG CAPS Take 0.4 mg by mouth daily after supper.     No current facility-administered medications for this visit.     Physical Exam: Vitals:   06/29/18 1523  BP: 110/66  Pulse: (!) 59  SpO2: 98%  Weight: 159 lb 12.8 oz (72.5 kg)  Height: 5\' 10"  (1.778 m)    GEN- The patient is well appearing, alert and oriented x 3 today.   Head- normocephalic, atraumatic Eyes-  Sclera clear, conjunctiva pink Ears- hearing intact Oropharynx- clear Lungs- Clear to ausculation bilaterally, normal work of breathing Heart- Regular rate and rhythm, no murmurs, rubs or gallops, PMI not laterally displaced GI- soft, NT, ND, + BS Extremities- no clubbing, cyanosis, or edema  Wt Readings from Last 3 Encounters:  06/29/18 159 lb 12.8 oz (72.5 kg)  12/31/17 159 lb 12.8 oz (72.5 kg)  10/29/17 159 lb (72.1 kg)    EKG tracing ordered today is personally reviewed and shows sinus bradycardia 59 bpm, PR 162 msec, QRS 80 msec, QTc 421 msec  Assessment and Plan:  1. Paroxysmal atrial fibrillation Well controlled post ablation off AAD therapy chads2vasc score is 3.  On eliquis  2. CAD/ ischemic CM No  ischemic symptoms euvolemic Echo 11/27/17 is reviewed and reveals EF 40-45% No changes  Follow-up with Dr Tamala Julian as scheduled I will see in a year-  Thompson Grayer MD, [facility] 06/29/2018 3:31 PM

## 2018-07-20 MED FILL — LOSARTAN POTASSIUM 25 MG TA: 25 | 90 days supply | Qty: 90 | Fill #2

## 2018-07-20 MED FILL — METOPROLOL TARTRATE 25 MG T: 25 | 90 days supply | Qty: 90 | Fill #2

## 2018-08-11 MED FILL — TAMSULOSIN HCL 0.4 MG CAP: 0.4 | 90 days supply | Qty: 90 | Fill #0

## 2018-08-27 MED FILL — ROSUVASTATIN CALCIUM 5 MG T: 5 | 90 days supply | Qty: 90 | Fill #2

## 2018-09-15 MED FILL — ELIQUIS 5 MG TABLET: 5 | 90 days supply | Qty: 180 | Fill #1

## 2018-10-16 ENCOUNTER — Inpatient Hospital Stay (HOSPITAL_COMMUNITY): Admission: RE | Admit: 2018-10-16 | Payer: Medicare Other | Source: Ambulatory Visit

## 2018-10-19 MED FILL — METOPROLOL TARTRATE 25 MG T: 25 | 90 days supply | Qty: 90 | Fill #3

## 2018-10-23 MED FILL — LOSARTAN POTASSIUM 25 MG TA: 25 | 90 days supply | Qty: 90 | Fill #3

## 2018-11-12 MED FILL — TAMSULOSIN HCL 0.4 MG CAP: 0.4 | 90 days supply | Qty: 90 | Fill #1

## 2018-11-26 ENCOUNTER — Other Ambulatory Visit: Payer: Self-pay | Admitting: Interventional Cardiology

## 2018-11-26 ENCOUNTER — Other Ambulatory Visit: Payer: Self-pay | Admitting: *Deleted

## 2018-11-26 DIAGNOSIS — I2510 Atherosclerotic heart disease of native coronary artery without angina pectoris: Secondary | ICD-10-CM

## 2018-11-26 DIAGNOSIS — E785 Hyperlipidemia, unspecified: Secondary | ICD-10-CM

## 2018-11-26 MED FILL — ROSUVASTATIN CALCIUM 5 MG T: 5 | 90 days supply | Qty: 90 | Fill #0

## 2018-12-14 ENCOUNTER — Other Ambulatory Visit: Payer: Self-pay | Admitting: Internal Medicine

## 2018-12-14 MED FILL — ELIQUIS 5 MG TABLET: 5 | 90 days supply | Qty: 180 | Fill #0 | Status: TO

## 2018-12-14 NOTE — Telephone Encounter (Signed)
Pt last saw Dr Rayann Heman on 06/29/18, last labs 04/09/18 Creat 1.1 at [place], age 68, weight 72.5kg, based on specified criteria pt is on appropriate dosage of Eliquis 5mg  BID.  Will refill rx.

## 2018-12-17 ENCOUNTER — Other Ambulatory Visit: Payer: Self-pay | Admitting: Interventional Cardiology

## 2018-12-17 DIAGNOSIS — I6523 Occlusion and stenosis of bilateral carotid arteries: Secondary | ICD-10-CM

## 2018-12-17 DIAGNOSIS — E785 Hyperlipidemia, unspecified: Secondary | ICD-10-CM

## 2018-12-21 ENCOUNTER — Ambulatory Visit (HOSPITAL_COMMUNITY)
Admission: RE | Admit: 2018-12-21 | Discharge: 2018-12-21 | Disposition: A | Discharge status: Home / Self Care | Payer: Medicare Other | Source: Ambulatory Visit | Attending: Internal Medicine | Admitting: Internal Medicine

## 2018-12-21 DIAGNOSIS — E785 Hyperlipidemia, unspecified: Secondary | ICD-10-CM | POA: Diagnosis present

## 2018-12-21 DIAGNOSIS — I6523 Occlusion and stenosis of bilateral carotid arteries: Secondary | ICD-10-CM | POA: Diagnosis present

## 2019-01-04 NOTE — Progress Notes (Signed)
Cardiology Office Note:    Date:  01/05/2019   ID:  Isaac, [name] [name] 27, 1952, MRN [id number]  PCP:  Isaac Huddle, MD  Cardiologist:  Isaac Grooms, MD   Referring MD: Isaac Huddle, MD   Chief Complaint  Patient presents with  . Coronary Artery Disease    History of Present Illness:    Isaac [name] is a 68 y.o. male with a hx of h/o CAD/s/p bypass 2010, paroxsymal afib that had ablation 11/17, after failing tikosyn to control rhythm. Interval Cardiac CT showed atretic LADand non-ischemic nuclear study 2017.   He has not had bleeding anticoagulation therapy.  No clinical instances of atrial fibrillation.  He is physically active and has no exertional leg discomfort with activity to suggest claudication.  Recent Doppler study demonstrated less than 40% narrowing in each carotid.  He has not had any neurological complaints.  There are no medication side effects.  2-3 times per year he will have a relatively localized left parasternal discomfort that lasts 2 to 3 minutes and goes away.  It is never been precipitated by physical activity.  Past Medical History:  Diagnosis Date  . CERVICAL SPINE DISORDER, NOS   . Chronic systolic (congestive) heart failure (HCC)    EF 40%  . Coronary artery disease    a. s/p CABG  . 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 METATARSALGIA   . OSTEOARTHROSIS, LOCAL, SCND, HAND   . Paroxysmal atrial fibrillation [facility])     Past Surgical History:  Procedure Laterality Date  . APPENDECTOMY    . CHOLECYSTECTOMY    . CORONARY ARTERY BYPASS GRAFT  10/2009  . ELECTROPHYSIOLOGIC STUDY N/A 09/17/2016   Procedure: Atrial Fibrillation Ablation;  Surgeon: Thompson Grayer, MD;  Location: [place] [facility];  Service: Cardiovascular;  Laterality: N/A;    Current Medications: Current Meds  Medication Sig  . acetaminophen (TYLENOL) 325 MG tablet Take 325 mg by mouth every 6 (six) hours as needed for mild pain.  . cetirizine (ZYRTEC) 10 MG tablet  Take 10 mg by mouth daily.  . cholecalciferol (VITAMIN D) 400 units TABS tablet Take 1,200 Units by mouth daily.   Marland Kitchen ELIQUIS 5 MG TABS tablet TAKE 1 TABLET BY MOUTH 2 TIMES DAILY.  Marland Kitchen losartan (COZAAR) 25 MG tablet TAKE 1 TABLET BY MOUTH DAILY.  . metoprolol tartrate (LOPRESSOR) 25 MG tablet TAKE 1/2 TABLET BY MOUTH TWICE A DAY  . nitroGLYCERIN (NITROSTAT) 0.4 MG SL tablet Place 0.4 mg under the tongue every 5 (five) minutes as needed for chest pain.  . rosuvastatin (CRESTOR) 5 MG tablet TAKE 1 TABLET BY MOUTH DAILY.  . Tamsulosin HCl (FLOMAX) 0.4 MG CAPS Take 0.4 mg by mouth daily after supper.     Allergies:   Patient has no known allergies.   Social History   Socioeconomic History  . Marital status: Married    Spouse name: Not on file  . Number of children: Not on file  . Years of education: Not on file  . Highest education level: Not on file  Occupational History  . Not on file  Social Needs  . Financial resource strain: Not on file  . Food insecurity:    Worry: Not on file    Inability: Not on file  . Transportation needs:    Medical: Not on file    Non-medical: Not on file  Tobacco Use  . Smoking status: Passive Smoke Exposure - Never Smoker  . Smokeless tobacco:  Never Used  . Tobacco comment: both parents smoked  Substance and Sexual Activity  . Alcohol use: Yes    Comment: 3  . Drug use: No  . Sexual activity: Not Currently  Lifestyle  . Physical activity:    Days per week: Not on file    Minutes per session: Not on file  . Stress: Not on file  Relationships  . Social connections:    Talks on phone: Not on file    Gets together: Not on file    Attends religious service: Not on file    Active member of club or organization: Not on file    Attends meetings of clubs or organizations: Not on file    Relationship status: Not on file  Other Topics Concern  . Not on file  Social History Narrative  . Not on file     Family History: The patient's family  history includes Emphysema in his father and mother.  ROS:   Please see the history of present illness.    Some shortness of breath on occasion when he climbs a flight of stairs in his house.  On the contrary he is able to exercise without dyspnea or discomfort.  All other systems reviewed and are negative.  EKGs/Labs/Other Studies Reviewed:    The following studies were reviewed today: Bilateral carotid Doppler February 2020: Summary: Right Carotid: Velocities in the right ICA are consistent with a 1-39% stenosis.                The extracranial vessels were near-normal with only minimal wall                thickening or plaque.  Left Carotid: Velocities in the left ICA are consistent with a 1-39% stenosis.               The extracranial vessels were near-normal with only minimal wall               thickening or plaque.  Vertebrals:  Bilateral vertebral arteries demonstrate antegrade flow. Subclavians: Normal flow hemodynamics were seen in bilateral subclavian              arteries.  *See table(s) above for measurements and observations.     Electronically signed by Larae Grooms MD on 12/21/2018 at 3:18:18 PM.     EKG:  EKG most recent EKG from August 2019 was reviewed.  Anteroseptal infarct V1 through V5.  Sinus bradycardia.  No change compared to the prior tracing.  Recent Labs: No results found for requested labs within last 8760 hours.  Recent Lipid Panel    Component Value Date/Time   CHOL 125 12/31/2017 1120   TRIG 86 12/31/2017 1120   HDL 48 12/31/2017 1120   CHOLHDL 2.6 12/31/2017 1120   CHOLHDL 2.9 05/22/2012 0221   VLDL 21 05/22/2012 0221   LDLCALC 60 12/31/2017 1120    Physical Exam:    VS:  BP 96/60   Pulse 60   Ht 5\' 10"  (1.778 m)   Wt 161 lb 9.6 oz (73.3 kg)   SpO2 96%   BMI 23.19 kg/m     Wt Readings from Last 3 Encounters:  01/05/19 161 lb 9.6 oz (73.3 kg)  06/29/18 159 lb 12.8 oz (72.5 kg)  12/31/17 159 lb 12.8 oz (72.5 kg)      GEN: Healthy-appearing. No acute distress HEENT: Normal NECK: No JVD. LYMPHATICS: No lymphadenopathy CARDIAC: RRR.  No murmur, no gallop, no edema VASCULAR: 2+  bilateral radial pulses, no bruits RESPIRATORY:  Clear to auscultation without rales, wheezing or rhonchi  ABDOMEN: Soft, non-tender, non-distended, No pulsatile mass, MUSCULOSKELETAL: No deformity  SKIN: Warm and dry NEUROLOGIC:  Alert and oriented x 3 PSYCHIATRIC:  Normal affect   ASSESSMENT:    1. Hyperlipidemia LDL goal <70   2. Coronary artery disease involving coronary bypass graft of native heart with angina pectoris ([place])   3. Chronic combined systolic and diastolic CHF, NYHA class 2 (HCC)   4. Paroxysmal atrial fibrillation ([place])   5. Bilateral carotid artery stenosis    PLAN:    In order of problems listed above:  1. Target on low-dose statin therapy.  No change. 2. Discussed secondary prevention in great detail.  Currently stable and hitting all targets.  See below. 3. No volume overload or evidence of CHF. 4. No clinical recurrences of atrial fibrillation.  He inquires about whether or not he could stop apixaban.  He is not on aspirin.  He has always felt atrial fibrillation when he has had episodes (we believe?).  For this judgment to Dr. Rayann Heman. 5. Stable with primary preventive measures being the same as secondary prevention for cardiac.  Overall education and awareness concerning primary/secondary risk prevention was discussed in detail: LDL less than 70, hemoglobin A1c less than 7, blood pressure target less than 130/80 mmHg, >150 minutes of moderate aerobic activity per week, avoidance of smoking, weight control (via diet and exercise), and continued surveillance/management of/for obstructive sleep apnea.  Clinical follow-up in 1 year.  Determine if chronic long-term anticoagulation therapy is still needed.  This will be done by EP.   Medication Adjustments/Labs and Tests Ordered: Current medicines  are reviewed at length with the patient today.  Concerns regarding medicines are outlined above.  No orders of the defined types were placed in this encounter.  No orders of the defined types were placed in this encounter.   There are no Patient Instructions on file for this visit.   Signed, Isaac Grooms, MD  01/05/2019 10:31 AM    [place]

## 2019-01-05 ENCOUNTER — Encounter: Payer: Self-pay | Admitting: Interventional Cardiology

## 2019-01-05 ENCOUNTER — Ambulatory Visit: Payer: Medicare Other | Admitting: Interventional Cardiology

## 2019-01-05 VITALS — BP 96/60 | HR 60 | Ht 70.0 in | Wt 161.6 lb

## 2019-01-05 DIAGNOSIS — Z7901 Long term (current) use of anticoagulants: Secondary | ICD-10-CM

## 2019-01-05 DIAGNOSIS — I6523 Occlusion and stenosis of bilateral carotid arteries: Secondary | ICD-10-CM

## 2019-01-05 DIAGNOSIS — I480 Paroxysmal atrial fibrillation: Secondary | ICD-10-CM

## 2019-01-05 DIAGNOSIS — I25709 Atherosclerosis of coronary artery bypass graft(s), unspecified, with unspecified angina pectoris: Secondary | ICD-10-CM | POA: Diagnosis not present

## 2019-01-05 DIAGNOSIS — E785 Hyperlipidemia, unspecified: Secondary | ICD-10-CM | POA: Diagnosis not present

## 2019-01-05 DIAGNOSIS — I5042 Chronic combined systolic (congestive) and diastolic (congestive) heart failure: Secondary | ICD-10-CM | POA: Diagnosis not present

## 2019-01-05 NOTE — Patient Instructions (Signed)

## 2019-01-12 ENCOUNTER — Other Ambulatory Visit: Payer: Self-pay | Admitting: Interventional Cardiology

## 2019-01-12 MED FILL — LOSARTAN POTASSIUM 25 MG TA: 25 | 90 days supply | Qty: 90 | Fill #0 | Status: TO

## 2019-01-12 MED FILL — METOPROLOL TARTRATE 25 MG T: 25 | 90 days supply | Qty: 90 | Fill #0 | Status: TO

## 2019-02-09 MED FILL — TAMSULOSIN HCL 0.4 MG CAP: 0.4 | 90 days supply | Qty: 90 | Fill #0

## 2019-02-24 MED FILL — ROSUVASTATIN CALCIUM 5 MG T: 5 | 90 days supply | Qty: 90 | Fill #0

## 2019-03-15 MED FILL — ELIQUIS 5 MG TABLET: 5 | 90 days supply | Qty: 180 | Fill #0

## 2019-03-26 ENCOUNTER — Telehealth: Payer: Self-pay | Admitting: Internal Medicine

## 2019-03-26 MED FILL — CHLORHEXIDINE 0.12% RINSE: 0.12 | 17 days supply | Qty: 473 | Fill #0

## 2019-03-26 MED FILL — AZITHROMYCIN 500 MG TABLET: 500 | 3 days supply | Qty: 3 | Fill #0

## 2019-03-26 NOTE — Telephone Encounter (Signed)
New Message     Pt is calling because he says he is having a dental implant and they are wanting him to take a high dose of Vitamin D and Vitamin K2 and he wants to make sure this is okay    Please call

## 2019-03-26 NOTE — Telephone Encounter (Signed)
Spoke with pt and made him aware ok to take these meds.  Pt appreciative for call.

## 2019-03-26 NOTE — Telephone Encounter (Signed)
Any contraindications for taking these?

## 2019-03-26 NOTE — Telephone Encounter (Signed)
These are both ok for pt to take before his implant.

## 2019-04-13 MED FILL — LOSARTAN POTASSIUM 25 MG TA: 25 | 30 days supply | Qty: 30 | Fill #0

## 2019-04-13 MED FILL — METOPROLOL TARTRATE 25 MG T: 25 | 90 days supply | Qty: 90 | Fill #0

## 2019-05-11 MED FILL — TAMSULOSIN HCL 0.4 MG CAP: 0.4 | 90 days supply | Qty: 90 | Fill #0

## 2019-05-11 MED FILL — LOSARTAN POTASSIUM 25 MG TA: 25 | 30 days supply | Qty: 30 | Fill #0

## 2019-05-31 MED FILL — ROSUVASTATIN CALCIUM 5 MG T: 5 | 90 days supply | Qty: 90 | Fill #0

## 2019-06-15 ENCOUNTER — Other Ambulatory Visit: Payer: Self-pay | Admitting: Internal Medicine

## 2019-06-15 MED FILL — LOSARTAN POTASSIUM 25 MG TA: 25 | 30 days supply | Qty: 30 | Fill #1

## 2019-06-15 MED FILL — ELIQUIS 5 MG TABLET: 5 | 90 days supply | Qty: 180 | Fill #0

## 2019-06-15 NOTE — Telephone Encounter (Signed)
Pt last saw Dr Tamala Julian 01/05/19, last labs 04/23/19 Creat 1.02 at [facility], age 68, weight 73.3, based on specified criteria pt is on appropriate dosage of Eliquis 5mg  BID.  Will refill rx.

## 2019-07-02 ENCOUNTER — Telehealth: Payer: Self-pay

## 2019-07-05 ENCOUNTER — Ambulatory Visit: Payer: Medicare Other | Admitting: Internal Medicine

## 2019-07-07 ENCOUNTER — Encounter: Payer: Self-pay | Admitting: Internal Medicine

## 2019-07-07 ENCOUNTER — Telehealth (INDEPENDENT_AMBULATORY_CARE_PROVIDER_SITE_OTHER): Payer: Medicare Other | Admitting: Internal Medicine

## 2019-07-07 ENCOUNTER — Other Ambulatory Visit: Payer: Self-pay

## 2019-07-07 VITALS — Ht 70.0 in | Wt 155.0 lb

## 2019-07-07 DIAGNOSIS — I25709 Atherosclerosis of coronary artery bypass graft(s), unspecified, with unspecified angina pectoris: Secondary | ICD-10-CM

## 2019-07-07 DIAGNOSIS — I480 Paroxysmal atrial fibrillation: Secondary | ICD-10-CM

## 2019-07-07 NOTE — Progress Notes (Signed)
Electrophysiology TeleHealth Note   Due to national recommendations of social distancing due to COVID 19, an audio/video telehealth visit is felt to be most appropriate for this patient at this time.  See MyChart message from today for the patient's consent to telehealth for [facility].   Date:  07/07/2019   ID:  Isaac [name], DOB 09-01-1951, MRN [id number]  Location: patient's home  Provider location:  [facility]  Evaluation Performed: Follow-up visit  PCP:  Josetta Huddle, MD   Electrophysiologist:  Dr Rayann Heman  Chief Complaint:  palpitations  History of Present Illness:    Isaac [name] is a 68 y.o. male who presents via telehealth conferencing today.  Since last being seen in our clinic, the patient reports doing very well.  Today, he denies symptoms of palpitations, chest pain, shortness of breath,  lower extremity edema, dizziness, presyncope, or syncope.  The patient is otherwise without complaint today.  The patient denies symptoms of fevers, chills, cough, or new SOB worrisome for COVID 19.  Past Medical History:  Diagnosis Date  . CERVICAL SPINE DISORDER, NOS   . Chronic systolic (congestive) heart failure (HCC)    EF 40%  . Coronary artery disease    a. s/p CABG  . 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 METATARSALGIA   . OSTEOARTHROSIS, LOCAL, SCND, HAND   . Paroxysmal atrial fibrillation [facility])     Past Surgical History:  Procedure Laterality Date  . APPENDECTOMY    . CHOLECYSTECTOMY    . CORONARY ARTERY BYPASS GRAFT  10/2009  . ELECTROPHYSIOLOGIC STUDY N/A 09/17/2016   Procedure: Atrial Fibrillation Ablation;  Surgeon: Thompson Grayer, MD;  Location: [place] [facility];  Service: Cardiovascular;  Laterality: N/A;    Current Outpatient Medications  Medication Sig Dispense Refill  . acetaminophen (TYLENOL) 325 MG tablet Take 325 mg by mouth every 6 (six) hours as needed for mild pain.    . cetirizine (ZYRTEC) 10 MG tablet Take 10 mg by mouth daily.     . cholecalciferol (VITAMIN D) 400 units TABS tablet Take 3,000 Units by mouth daily.     Marland Kitchen ELIQUIS 5 MG TABS tablet TAKE 1 TABLET BY MOUTH 2 TIMES DAILY. 180 tablet 1  . losartan (COZAAR) 25 MG tablet TAKE 1 TABLET BY MOUTH DAILY. 90 tablet 3  . metoprolol tartrate (LOPRESSOR) 25 MG tablet TAKE 1/2 TABLET BY MOUTH TWICE A DAY 90 tablet 3  . nitroGLYCERIN (NITROSTAT) 0.4 MG SL tablet Place 0.4 mg under the tongue every 5 (five) minutes as needed for chest pain.    . rosuvastatin (CRESTOR) 5 MG tablet TAKE 1 TABLET BY MOUTH DAILY. 90 tablet 2  . Tamsulosin HCl (FLOMAX) 0.4 MG CAPS Take 0.4 mg by mouth daily after supper.     No current facility-administered medications for this visit.     Allergies:   Patient has no known allergies.   Social History:  The patient  reports that he is a non-smoker but has been exposed to tobacco smoke. He has never used smokeless tobacco. He reports current alcohol use. He reports that he does not use drugs.   Family History:  The patient's  family history includes Emphysema in his father and mother.   ROS:  Please see the history of present illness.   All other systems are personally reviewed and negative.    Exam:    Vital Signs:  Ht 5\' 10"  (1.778 m)   Wt 155 lb (70.3 kg)  BMI 22.24 kg/m   Well sounding and appearing, alert and conversant, regular work of breathing,  good skin color Eyes- anicteric, neuro- grossly intact, skin- no apparent rash or lesions or cyanosis, mouth- oral mucosa is pink  Labs/Other Tests and Data Reviewed:    Recent Labs: No results found for requested labs within last 8760 hours.   Wt Readings from Last 3 Encounters:  07/07/19 155 lb (70.3 kg)  01/05/19 161 lb 9.6 oz (73.3 kg)  06/29/18 159 lb 12.8 oz (72.5 kg)     ASSESSMENT & PLAN:    1.  Paroxysmal atrial fibrillation Doing very well post ablation On eliquis   2. CAD/ ischemic CM (EF 40-45%) Followed by Dr Tamala Julian No ischemic symptoms   Follow-up:  12  months with me   Patient Risk:  after full review of this patients clinical status, I feel that they are at moderate risk at this time.  Today, I have spent 15 minutes with the patient with telehealth technology discussing arrhythmia management .    SignedThompson Grayer, MD  07/07/2019 3:47 PM     [place] [address] [phone] (office) [phone] (fax)

## 2019-07-13 MED FILL — LOSARTAN POTASSIUM 25 MG TA: 25 | 30 days supply | Qty: 30 | Fill #2

## 2019-07-13 MED FILL — METOPROLOL TARTRATE 25 MG T: 25 | 90 days supply | Qty: 90 | Fill #0

## 2019-08-12 MED FILL — LOSARTAN POTASSIUM 25 MG TA: 25 | 30 days supply | Qty: 30 | Fill #3

## 2019-08-12 MED FILL — TAMSULOSIN HCL 0.4 MG CAP: 0.4 | 90 days supply | Qty: 90 | Fill #0

## 2019-08-30 ENCOUNTER — Other Ambulatory Visit: Payer: Self-pay | Admitting: Interventional Cardiology

## 2019-08-31 MED FILL — ROSUVASTATIN CALCIUM 5 MG T: 5 | 90 days supply | Qty: 90 | Fill #0

## 2019-09-08 MED FILL — ELIQUIS 5 MG TABLET: 5 | 90 days supply | Qty: 180 | Fill #1

## 2019-09-14 MED FILL — LOSARTAN POTASSIUM 25 MG TA: 25 | 30 days supply | Qty: 30 | Fill #4

## 2019-09-30 MED FILL — NYSTATIN 100,000 UNITS/GM O: 100000 | 30 days supply | Qty: 15 | Fill #0

## 2019-10-12 MED FILL — METOPROLOL TARTRATE 25 MG T: 25 | 90 days supply | Qty: 90 | Fill #1

## 2019-10-12 MED FILL — LOSARTAN POTASSIUM 25 MG TA: 25 | 30 days supply | Qty: 30 | Fill #5

## 2019-11-08 MED FILL — LOSARTAN POTASSIUM 25 MG TA: 25 | 30 days supply | Qty: 30 | Fill #6

## 2019-11-08 MED FILL — TAMSULOSIN HCL 0.4 MG CAP: 0.4 | 90 days supply | Qty: 90 | Fill #1

## 2019-11-23 MED FILL — ROSUVASTATIN CALCIUM 5 MG T: 5 | 90 days supply | Qty: 90 | Fill #1

## 2019-12-05 ENCOUNTER — Ambulatory Visit: Payer: Medicare Other

## 2019-12-05 ENCOUNTER — Ambulatory Visit: Payer: Medicare Other | Attending: Internal Medicine

## 2019-12-05 DIAGNOSIS — Z23 Encounter for immunization: Secondary | ICD-10-CM

## 2019-12-05 NOTE — Progress Notes (Signed)
   Covid-19 Vaccination Clinic  Name:  Isaac [name]    MRN: [id number] DOB: 02/22/1951  12/05/2019  [name] was observed post Covid-19 immunization for 15 minutes without incidence. He was provided with Vaccine Information Sheet and instruction to access the V-Safe system.   [name] was instructed to call 911 with any severe reactions post vaccine: Marland Kitchen Difficulty breathing  . Swelling of your face and throat  . A fast heartbeat  . A bad rash all over your body  . Dizziness and weakness    Immunizations Administered    Name Date Dose VIS Date Route   Pfizer COVID-19 Vaccine 12/05/2019 10:31 AM 0.3 mL 10/22/2019 Intramuscular   Manufacturer: [place]   Lot: [id number]   [place]: [id number]

## 2019-12-07 ENCOUNTER — Other Ambulatory Visit: Payer: Self-pay | Admitting: Interventional Cardiology

## 2019-12-07 MED FILL — ELIQUIS 5 MG TABLET: 5 | 90 days supply | Qty: 180 | Fill #0

## 2019-12-07 MED FILL — LOSARTAN POTASSIUM 25 MG TA: 25 | 30 days supply | Qty: 30 | Fill #7

## 2019-12-07 NOTE — Telephone Encounter (Signed)
Age 69, weight 70kg, SCr 1.02 on 04/23/19 at [place], last visit Aug 2020, afib indication

## 2019-12-27 ENCOUNTER — Ambulatory Visit: Payer: Medicare Other | Attending: Internal Medicine

## 2019-12-27 DIAGNOSIS — Z23 Encounter for immunization: Secondary | ICD-10-CM

## 2019-12-27 NOTE — Progress Notes (Signed)
   Covid-19 Vaccination Clinic  Name:  Isaac [name]    MRN: [id number] DOB: [birth year]  12/27/2019  Isaac [name] was observed post Covid-19 immunization for 15 minutes without incidence. He was provided with Vaccine Information Sheet and instruction to access the V-Safe system.   Isaac [name] was instructed to call 911 with any severe reactions post vaccine: Marland Kitchen Difficulty breathing  . Swelling of your face and throat  . A fast heartbeat  . A bad rash all over your body  . Dizziness and weakness    Immunizations Administered    Name Date Dose VIS Date Route   Pfizer COVID-19 Vaccine 12/27/2019  8:22 AM 0.3 mL 10/22/2019 Intramuscular   Manufacturer: [place]   Lot: [id number]   [place]: [id number]

## 2020-01-11 ENCOUNTER — Other Ambulatory Visit: Payer: Self-pay | Admitting: Interventional Cardiology

## 2020-01-11 MED FILL — METOPROLOL TARTRATE 25 MG T: 25 | 90 days supply | Qty: 90 | Fill #2

## 2020-01-11 MED FILL — LOSARTAN POTASSIUM 25 MG TA: 25 | 30 days supply | Qty: 30 | Fill #0

## 2020-01-13 NOTE — Progress Notes (Signed)
Cardiology Office Note:    Date:  01/14/2020   ID:  Isaac [name], DOB [birth year], MRN [id number]  PCP:  Josetta Huddle, MD  Cardiologist:  Sinclair Grooms, MD   Referring MD: Josetta Huddle, MD   Chief Complaint  Patient presents with  . Coronary Artery Disease  . Atrial Fibrillation    History of Present Illness:    Isaac [name] is a 69 y.o. male with a hx of CAD/s/p bypass2010, paroxsymal afib that had ablation 11/17, after failing tikosyn tocontrolrhythm. IntervalCardiac CT showed atretic LADand non-ischemic nuclear [phone].  Isaac [name] has done relatively well in the COVID-19 pandemic.  He has suffered through not being able to play tennis regularly.  He is still attempted to get some exercise from walking and calisthenics.  His wife broke her foot recently.  They were walking together frequently but after this occurred, he spent much more time helping her.  He denies angina, dyspnea, lower extremity swelling, orthopnea, and neurological symptoms.  He has not had blood in the stool or urine.  He has had some palpitations.  No prolonged episodes.  The most recent [place]  He had palpitations and was told that they were premature beats that the heart center.  Past Medical History:  Diagnosis Date  . CERVICAL SPINE DISORDER, NOS   . Chronic systolic (congestive) heart failure (HCC)    EF 40%  . Coronary artery disease    a. s/p CABG  . 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 METATARSALGIA   . OSTEOARTHROSIS, LOCAL, SCND, HAND   . Paroxysmal atrial fibrillation [facility])     Past Surgical History:  Procedure Laterality Date  . APPENDECTOMY    . CHOLECYSTECTOMY    . CORONARY ARTERY BYPASS GRAFT  10/2009  . ELECTROPHYSIOLOGIC STUDY N/A 09/17/2016   Procedure: Atrial Fibrillation Ablation;  Surgeon: Thompson Grayer, MD;  Location: [place] [facility];  Service: Cardiovascular;  Laterality: N/A;    Current  Medications: Current Meds  Medication Sig  . acetaminophen (TYLENOL) 325 MG tablet Take 325 mg by mouth every 6 (six) hours as needed for mild pain.  . cetirizine (ZYRTEC) 10 MG tablet Take 10 mg by mouth daily.  . cholecalciferol (VITAMIN D) 400 units TABS tablet Take 3,000 Units by mouth daily.   Marland Kitchen ELIQUIS 5 MG TABS tablet TAKE 1 TABLET BY MOUTH 2 TIMES DAILY.  Marland Kitchen losartan (COZAAR) 25 MG tablet TAKE 1 TABLET BY MOUTH DAILY.  . metoprolol tartrate (LOPRESSOR) 25 MG tablet TAKE 1/2 TABLET BY MOUTH TWICE A DAY  . nitroGLYCERIN (NITROSTAT) 0.4 MG SL tablet Place 0.4 mg under the tongue every 5 (five) minutes as needed for chest pain.  . rosuvastatin (CRESTOR) 5 MG tablet TAKE 1 TABLET BY MOUTH DAILY.  . Tamsulosin HCl (FLOMAX) 0.4 MG CAPS Take 0.4 mg by mouth daily after supper.     Allergies:   Patient has no known allergies.   Social History   Socioeconomic History  . Marital status: Married    Spouse name: Not on file  . Number of children: Not on file  . Years of education: Not on file  . Highest education level: Not on file  Occupational History  . Not on file  Tobacco Use  . Smoking status: Passive Smoke Exposure - Never Smoker  . Smokeless tobacco: Never Used  . Tobacco comment: both parents smoked  Substance and Sexual Activity  . Alcohol  use: Yes    Comment: 3  . Drug use: No  . Sexual activity: Not Currently  Other Topics Concern  . Not on file  Social History Narrative  . Not on file   Social Determinants of Health   Financial Resource Strain:   . Difficulty of Paying Living Expenses: Not on file  Food Insecurity:   . Worried About Charity fundraiser in the Last Year: Not on file  . Ran Out of Food in the Last Year: Not on file  Transportation Needs:   . Lack of Transportation (Medical): Not on file  . Lack of Transportation (Non-Medical): Not on file  Physical Activity:   . Days of Exercise per Week: Not on file  . Minutes of Exercise per Session: Not on  file  Stress:   . Feeling of Stress : Not on file  Social Connections:   . Frequency of Communication with Friends and Family: Not on file  . Frequency of Social Gatherings with Friends and Family: Not on file  . Attends Religious Services: Not on file  . Active Member of Clubs or Organizations: Not on file  . Attends Archivist Meetings: Not on file  . Marital Status: Not on file     Family History: The patient's family history includes Emphysema in his father and mother.  ROS:   Please see the history of present illness.    No medication intolerances.  Sleeping well.  No traveling due to the COVID-19 pandemic.  There has been some concern because typical blood pressures previously ran in the [id number] mmHg systolic range.  More recently blood pressures tend to run in the 99991111 mmHg systolic range.  He does pointed this out.  All other systems reviewed and are negative.  EKGs/Labs/Other Studies Reviewed:    The following studies were reviewed today:  No recent cardiac imaging or functional data.  EKG:  EKG normal sinus rhythm, interpolated PVC, vertical axis, poor R wave progression V1 through V4 compatible with prior known anterior infarction.  Compared to August 2019, interpolated PVCs are new.  Recent Labs: No results found for requested labs within last 8760 hours.  Recent Lipid Panel    Component Value Date/Time   CHOL 125 12/31/2017 1120   TRIG 86 12/31/2017 1120   HDL 48 12/31/2017 1120   CHOLHDL 2.6 12/31/2017 1120   CHOLHDL 2.9 05/22/2012 0221   VLDL 21 05/22/2012 0221   LDLCALC 60 12/31/2017 1120    Physical Exam:    VS:  BP 122/76   Pulse 67   Ht 5\' 10"  (1.778 m)   Wt 156 lb 12.8 oz (71.1 kg)   SpO2 98%   BMI 22.50 kg/m     Wt Readings from Last 3 Encounters:  01/14/20 156 lb 12.8 oz (71.1 kg)  07/07/19 155 lb (70.3 kg)  01/05/19 161 lb 9.6 oz (73.3 kg)     GEN: Slender. No acute distress HEENT: Normal NECK: No JVD. LYMPHATICS: No  lymphadenopathy CARDIAC:  RRR without murmur, gallop, or edema. VASCULAR:  Normal Pulses. No bruits. RESPIRATORY:  Clear to auscultation without rales, wheezing or rhonchi  ABDOMEN: Soft, non-tender, non-distended, No pulsatile mass, MUSCULOSKELETAL: No deformity  SKIN: Warm and dry NEUROLOGIC:  Alert and oriented x 3 PSYCHIATRIC:  Normal affect   ASSESSMENT:    1. Paroxysmal atrial fibrillation (HCC)   2. Coronary artery disease involving coronary bypass graft of native heart with angina pectoris ([place])   3. Hyperlipidemia LDL goal <  70   4. Chronic combined systolic and diastolic CHF, NYHA class 2 ([place])   5. Chronic anticoagulation   6. Educated about COVID-19 virus infection    PLAN:    In order of problems listed above:  1. No recurrences of A. fib of clinical substance since ablation. 2. Secondary prevention discussed.  Urged getting back to regular physical activity and achieving at least 150 minutes of moderate activity per week. 3. LDL target less than 70.  Continue Crestor 5 mg/day. 4. No clinical evidence of volume overload or heart failure.  Protective therapy with Cozaar and metoprolol is in place. 5. No bleeding complications on Eliquis.  Creatinine and CBC will be obtained today. 6. COVID-19 vaccination has been received.  Practicing the 3W's.  Overall education and awareness concerning primary/secondary risk prevention was discussed in detail: LDL less than 70, hemoglobin A1c less than 7, blood pressure target less than 130/80 mmHg, >150 minutes of moderate aerobic activity per week, avoidance of smoking, weight control (via diet and exercise), and continued surveillance/management of/for obstructive sleep apnea.    Medication Adjustments/Labs and Tests Ordered: Current medicines are reviewed at length with the patient today.  Concerns regarding medicines are outlined above.  Orders Placed This Encounter  Procedures  . CBC  . Basic metabolic panel  . EKG 12-Lead     No orders of the defined types were placed in this encounter.   Patient Instructions  Medication Instructions:  Your physician recommends that you continue on your current medications as directed. Please refer to the Current Medication list given to you today.  *If you need a refill on your cardiac medications before your next appointment, please call your pharmacy*   Lab Work: BMET, CBC today If you have labs (blood work) drawn today and your tests are completely normal, you will receive your results only by: Marland Kitchen MyChart Message (if you have MyChart) OR . A paper copy in the mail If you have any lab test that is abnormal or we need to change your treatment, we will call you to review the results.   Testing/Procedures: None   Follow-Up: At [facility], you and your health needs are our priority.  As part of our continuing mission to provide you with exceptional heart care, we have created designated Provider Care Teams.  These Care Teams include your primary Cardiologist (physician) and Advanced Practice Providers (APPs -  Physician Assistants and Nurse Practitioners) who all work together to provide you with the care you need, when you need it.  We recommend signing up for the patient portal called "MyChart".  Sign up information is provided on this After Visit Summary.  MyChart is used to connect with patients for Virtual Visits (Telemedicine).  Patients are able to view lab/test results, encounter notes, upcoming appointments, etc.  Non-urgent messages can be sent to your provider as well.   To learn more about what you can do with MyChart, go to [email or web address].    Your next appointment:   12 month(s)  The format for your next appointment:   In Person  Provider:   You may see Sinclair Grooms, MD or one of the following Advanced Practice Providers on your designated Care Team:    Truitt Merle, NP  Cecilie Kicks, NP  Kathyrn Drown, NP    Other  Instructions      Signed, Sinclair Grooms, MD  01/14/2020 5:02 PM    [place]

## 2020-01-14 ENCOUNTER — Encounter: Payer: Self-pay | Admitting: Interventional Cardiology

## 2020-01-14 ENCOUNTER — Ambulatory Visit: Payer: Medicare Other | Admitting: Interventional Cardiology

## 2020-01-14 ENCOUNTER — Other Ambulatory Visit: Payer: Self-pay

## 2020-01-14 VITALS — BP 122/76 | HR 67 | Ht 70.0 in | Wt 156.8 lb

## 2020-01-14 DIAGNOSIS — I480 Paroxysmal atrial fibrillation: Secondary | ICD-10-CM

## 2020-01-14 DIAGNOSIS — I25709 Atherosclerosis of coronary artery bypass graft(s), unspecified, with unspecified angina pectoris: Secondary | ICD-10-CM

## 2020-01-14 DIAGNOSIS — Z7189 Other specified counseling: Secondary | ICD-10-CM

## 2020-01-14 DIAGNOSIS — E785 Hyperlipidemia, unspecified: Secondary | ICD-10-CM

## 2020-01-14 DIAGNOSIS — I5042 Chronic combined systolic (congestive) and diastolic (congestive) heart failure: Secondary | ICD-10-CM | POA: Diagnosis not present

## 2020-01-14 DIAGNOSIS — Z7901 Long term (current) use of anticoagulants: Secondary | ICD-10-CM

## 2020-01-14 NOTE — Patient Instructions (Signed)
Medication Instructions:  Your physician recommends that you continue on your current medications as directed. Please refer to the Current Medication list given to you today.  *If you need a refill on your cardiac medications before your next appointment, please call your pharmacy*   Lab Work: BMET, CBC today If you have labs (blood work) drawn today and your tests are completely normal, you will receive your results only by: Marland Kitchen MyChart Message (if you have MyChart) OR . A paper copy in the mail If you have any lab test that is abnormal or we need to change your treatment, we will call you to review the results.   Testing/Procedures: None   Follow-Up: At [facility], you and your health needs are our priority.  As part of our continuing mission to provide you with exceptional heart care, we have created designated Provider Care Teams.  These Care Teams include your primary Cardiologist (physician) and Advanced Practice Providers (APPs -  Physician Assistants and Nurse Practitioners) who all work together to provide you with the care you need, when you need it.  We recommend signing up for the patient portal called "MyChart".  Sign up information is provided on this After Visit Summary.  MyChart is used to connect with patients for Virtual Visits (Telemedicine).  Patients are able to view lab/test results, encounter notes, upcoming appointments, etc.  Non-urgent messages can be sent to your provider as well.   To learn more about what you can do with MyChart, go to [email or web address].    Your next appointment:   12 month(s)  The format for your next appointment:   In Person  Provider:   You may see Sinclair Grooms, MD or one of the following Advanced Practice Providers on your designated Care Team:    Truitt Merle, NP  Cecilie Kicks, NP  Kathyrn Drown, NP    Other Instructions

## 2020-01-15 LAB — CBC
Hematocrit: 43.9 % (ref 37.5–51.0)
Hemoglobin: 15.1 g/dL (ref 13.0–17.7)
MCH: 31.5 pg (ref 26.6–33.0)
MCHC: 34.4 g/dL (ref 31.5–35.7)
MCV: 92 fL (ref 79–97)
Platelets: 204 10*3/uL (ref 150–450)
RBC: 4.8 x10E6/uL (ref 4.14–5.80)
RDW: 11.6 % (ref 11.6–15.4)
WBC: 5.5 10*3/uL (ref 3.4–10.8)

## 2020-01-15 LAB — BASIC METABOLIC PANEL
BUN/Creatinine Ratio: 19 (ref 10–24)
BUN: 21 mg/dL (ref 8–27)
CO2: 20 mmol/L (ref 20–29)
Calcium: 9.6 mg/dL (ref 8.6–10.2)
Chloride: 105 mmol/L (ref 96–106)
Creatinine, Ser: 1.08 mg/dL (ref 0.76–1.27)
GFR calc Af Amer: 79 mL/min/{1.73_m2} (ref >59)
GFR calc non Af Amer: 69 mL/min/{1.73_m2} (ref >59)
Sodium: 143 mmol/L (ref 134–144)

## 2020-02-07 MED FILL — LOSARTAN POTASSIUM 25 MG TA: 25 | 30 days supply | Qty: 30 | Fill #1

## 2020-02-07 MED FILL — TAMSULOSIN HCL 0.4 MG CAP: 0.4 | 90 days supply | Qty: 90 | Fill #2

## 2020-02-23 MED FILL — ROSUVASTATIN CALCIUM 5 MG T: 5 | 90 days supply | Qty: 90 | Fill #2

## 2020-03-07 MED FILL — ELIQUIS 5 MG TABLET: 5 | 90 days supply | Qty: 180 | Fill #1

## 2020-03-07 MED FILL — LOSARTAN POTASSIUM 25 MG TA: 25 | 30 days supply | Qty: 30 | Fill #2

## 2020-04-11 ENCOUNTER — Other Ambulatory Visit: Payer: Self-pay | Admitting: Interventional Cardiology

## 2020-04-11 MED FILL — LOSARTAN POTASSIUM 25 MG TA: 25 | 30 days supply | Qty: 30 | Fill #3

## 2020-04-11 MED FILL — METOPROLOL TARTRATE 25 MG T: 25 | 90 days supply | Qty: 90 | Fill #0

## 2020-05-09 MED FILL — TAMSULOSIN HCL 0.4 MG CAP: 0.4 | 90 days supply | Qty: 90 | Fill #3

## 2020-05-09 MED FILL — LOSARTAN POTASSIUM 25 MG TA: 25 | 30 days supply | Qty: 30 | Fill #4

## 2020-05-24 ENCOUNTER — Other Ambulatory Visit: Payer: Self-pay | Admitting: Interventional Cardiology

## 2020-05-24 MED FILL — ROSUVASTATIN CALCIUM 5 MG T: 5 | 90 days supply | Qty: 90 | Fill #0

## 2020-06-06 ENCOUNTER — Other Ambulatory Visit: Payer: Self-pay | Admitting: Interventional Cardiology

## 2020-06-06 MED FILL — ELIQUIS 5 MG TABLET: 5 | 90 days supply | Qty: 180 | Fill #0

## 2020-06-06 NOTE — Telephone Encounter (Signed)
Pt last saw Dr Tamala Julian 01/14/20, last labs 01/14/20 Creat 1.08, age 69, weight 71.1kg, based on specified criteria pt is on appropriate dosage of Eliquis 5mg  BID.  Will refill rx.

## 2020-06-12 ENCOUNTER — Other Ambulatory Visit: Payer: Self-pay | Admitting: Interventional Cardiology

## 2020-06-12 MED FILL — LOSARTAN POTASSIUM 25 MG TA: 25 | 30 days supply | Qty: 30 | Fill #0

## 2020-07-10 MED FILL — LOSARTAN POTASSIUM 25 MG TA: 25 | 30 days supply | Qty: 30 | Fill #1

## 2020-07-10 MED FILL — METOPROLOL TARTRATE 25 MG T: 25 | 90 days supply | Qty: 90 | Fill #1

## 2020-08-08 ENCOUNTER — Other Ambulatory Visit (HOSPITAL_COMMUNITY): Payer: Self-pay | Admitting: Internal Medicine

## 2020-08-08 MED FILL — TAMSULOSIN HCL 0.4 MG CAP: 0.4 | 90 days supply | Qty: 90 | Fill #0

## 2020-08-08 MED FILL — LOSARTAN POTASSIUM 25 MG TA: 25 | 30 days supply | Qty: 30 | Fill #2

## 2020-08-15 ENCOUNTER — Ambulatory Visit: Payer: Medicare Other | Attending: Internal Medicine

## 2020-08-15 DIAGNOSIS — Z23 Encounter for immunization: Secondary | ICD-10-CM

## 2020-08-15 NOTE — Progress Notes (Signed)
   Covid-19 Vaccination Clinic  Name:  Isaac [name]    MRN: [id number] DOB: 1951-02-22  08/15/2020  Mr. [name] was observed post Covid-19 immunization for 15 minutes without incident. He was provided with Vaccine Information Sheet and instruction to access the V-Safe system.   Mr. [name] was instructed to call 911 with any severe reactions post vaccine: Marland Kitchen Difficulty breathing  . Swelling of face and throat  . A fast heartbeat  . A bad rash all over body  . Dizziness and weakness      Covid-19 Vaccination Clinic  Name:  Isaac [name]    MRN: [id number] DOB: 10-01-1951  08/15/2020  Mr. [name] was observed post Covid-19 immunization for 15 minutes without incident. He was provided with Vaccine Information Sheet and instruction to access the V-Safe system.   Mr. [name] was instructed to call 911 with any severe reactions post vaccine: Marland Kitchen Difficulty breathing  . Swelling of face and throat  . A fast heartbeat  . A bad rash all over body  . Dizziness and weakness

## 2020-08-21 MED FILL — ROSUVASTATIN CALCIUM 5 MG T: 5 | 90 days supply | Qty: 90 | Fill #1

## 2020-09-04 MED FILL — LOSARTAN POTASSIUM 25 MG TA: 25 | 30 days supply | Qty: 30 | Fill #3

## 2020-09-04 MED FILL — ELIQUIS 5 MG TABLET: 5 | 90 days supply | Qty: 180 | Fill #1

## 2020-10-09 MED FILL — METOPROLOL TARTRATE 25 MG T: 25 | 90 days supply | Qty: 90 | Fill #2

## 2020-10-09 MED FILL — LOSARTAN POTASSIUM 25 MG TA: 25 | 30 days supply | Qty: 30 | Fill #4

## 2020-10-30 MED FILL — TAMSULOSIN HCL 0.4 MG CAP: 0.4 | 90 days supply | Qty: 90 | Fill #1

## 2020-11-07 MED FILL — LOSARTAN POTASSIUM 25 MG TA: 25 | 30 days supply | Qty: 30 | Fill #5

## 2020-11-21 MED FILL — ROSUVASTATIN CALCIUM 5 MG T: 5 | 90 days supply | Qty: 90 | Fill #2

## 2020-12-04 ENCOUNTER — Other Ambulatory Visit: Payer: Self-pay | Admitting: Interventional Cardiology

## 2020-12-04 DIAGNOSIS — R198 Other specified symptoms and signs involving the digestive system and abdomen: Secondary | ICD-10-CM | POA: Diagnosis not present

## 2020-12-04 MED FILL — ELIQUIS 5 MG TABLET: 5 | 90 days supply | Qty: 180 | Fill #0

## 2020-12-04 MED FILL — LOSARTAN POTASSIUM 25 MG TA: 25 | 30 days supply | Qty: 30 | Fill #6

## 2020-12-04 NOTE — Telephone Encounter (Signed)
Eliquis 5mg  refill request received. Patient is 70 years old, weight-71.1kg, Crea-1.08 on 01/14/2020, Diagnosis-Afib, and last seen by Dr. Tamala Julian on 01/14/2020 and pending appt in April 2022. Dose is appropriate based on dosing criteria. Will send in refill to requested pharmacy.

## 2020-12-11 DIAGNOSIS — I480 Paroxysmal atrial fibrillation: Secondary | ICD-10-CM | POA: Diagnosis not present

## 2020-12-11 DIAGNOSIS — I5032 Chronic diastolic (congestive) heart failure: Secondary | ICD-10-CM | POA: Diagnosis not present

## 2020-12-11 DIAGNOSIS — I5042 Chronic combined systolic (congestive) and diastolic (congestive) heart failure: Secondary | ICD-10-CM | POA: Diagnosis not present

## 2020-12-11 DIAGNOSIS — I4891 Unspecified atrial fibrillation: Secondary | ICD-10-CM | POA: Diagnosis not present

## 2020-12-11 DIAGNOSIS — I2510 Atherosclerotic heart disease of native coronary artery without angina pectoris: Secondary | ICD-10-CM | POA: Diagnosis not present

## 2020-12-11 DIAGNOSIS — E039 Hypothyroidism, unspecified: Secondary | ICD-10-CM | POA: Diagnosis not present

## 2020-12-11 DIAGNOSIS — M189 Osteoarthritis of first carpometacarpal joint, unspecified: Secondary | ICD-10-CM | POA: Diagnosis not present

## 2020-12-20 DIAGNOSIS — L309 Dermatitis, unspecified: Secondary | ICD-10-CM | POA: Diagnosis not present

## 2020-12-20 DIAGNOSIS — L570 Actinic keratosis: Secondary | ICD-10-CM | POA: Diagnosis not present

## 2020-12-20 DIAGNOSIS — L814 Other melanin hyperpigmentation: Secondary | ICD-10-CM | POA: Diagnosis not present

## 2020-12-20 DIAGNOSIS — Z85828 Personal history of other malignant neoplasm of skin: Secondary | ICD-10-CM | POA: Diagnosis not present

## 2020-12-20 DIAGNOSIS — L821 Other seborrheic keratosis: Secondary | ICD-10-CM | POA: Diagnosis not present

## 2020-12-21 ENCOUNTER — Other Ambulatory Visit (HOSPITAL_COMMUNITY): Payer: Self-pay | Admitting: Dermatology

## 2020-12-21 MED FILL — TRIAMCINOLONE 0.1% CREAM: 0.1 | 20 days supply | Qty: 45 | Fill #0

## 2021-01-01 ENCOUNTER — Other Ambulatory Visit: Payer: Self-pay | Admitting: Interventional Cardiology

## 2021-01-02 ENCOUNTER — Other Ambulatory Visit: Payer: Self-pay

## 2021-01-02 ENCOUNTER — Other Ambulatory Visit: Payer: Self-pay | Admitting: Interventional Cardiology

## 2021-01-02 MED ORDER — METOPROLOL TARTRATE 25 MG PO TABS: 12.5000 mg | ORAL_TABLET | Freq: Two times a day (BID) | ORAL | 0 refills | Status: DC

## 2021-01-02 MED ORDER — LOSARTAN POTASSIUM 25 MG PO TABS: 25.0000 mg | ORAL_TABLET | Freq: Every day | ORAL | 0 refills | Status: DC

## 2021-01-02 MED FILL — METOPROLOL TARTRATE 25 MG T: 25 | 90 days supply | Qty: 90 | Fill #0

## 2021-01-02 MED FILL — LOSARTAN POTASSIUM 25 MG TA: 25 | 30 days supply | Qty: 30 | Fill #0

## 2021-01-02 NOTE — Telephone Encounter (Signed)
Pt's medications were sent to pt's pharmacy as requested. Confirmation received.  

## 2021-01-09 DIAGNOSIS — H524 Presbyopia: Secondary | ICD-10-CM | POA: Diagnosis not present

## 2021-01-09 DIAGNOSIS — H2513 Age-related nuclear cataract, bilateral: Secondary | ICD-10-CM | POA: Diagnosis not present

## 2021-01-09 DIAGNOSIS — H43813 Vitreous degeneration, bilateral: Secondary | ICD-10-CM | POA: Diagnosis not present

## 2021-01-09 DIAGNOSIS — H5213 Myopia, bilateral: Secondary | ICD-10-CM | POA: Diagnosis not present

## 2021-01-29 MED FILL — TAMSULOSIN HCL 0.4 MG CAP: 0.4 | 90 days supply | Qty: 90 | Fill #2

## 2021-02-05 MED FILL — LOSARTAN POTASSIUM 25 MG TA: 25 | 30 days supply | Qty: 30 | Fill #1

## 2021-02-08 DIAGNOSIS — I2510 Atherosclerotic heart disease of native coronary artery without angina pectoris: Secondary | ICD-10-CM | POA: Diagnosis not present

## 2021-02-08 DIAGNOSIS — I5032 Chronic diastolic (congestive) heart failure: Secondary | ICD-10-CM | POA: Diagnosis not present

## 2021-02-08 DIAGNOSIS — I4891 Unspecified atrial fibrillation: Secondary | ICD-10-CM | POA: Diagnosis not present

## 2021-02-08 DIAGNOSIS — I5042 Chronic combined systolic (congestive) and diastolic (congestive) heart failure: Secondary | ICD-10-CM | POA: Diagnosis not present

## 2021-02-08 DIAGNOSIS — E039 Hypothyroidism, unspecified: Secondary | ICD-10-CM | POA: Diagnosis not present

## 2021-02-08 DIAGNOSIS — I480 Paroxysmal atrial fibrillation: Secondary | ICD-10-CM | POA: Diagnosis not present

## 2021-02-19 ENCOUNTER — Other Ambulatory Visit: Payer: Self-pay | Admitting: Interventional Cardiology

## 2021-02-19 ENCOUNTER — Other Ambulatory Visit (HOSPITAL_COMMUNITY): Payer: Self-pay

## 2021-02-19 MED ORDER — ROSUVASTATIN CALCIUM 5 MG PO TABS
5.0000 mg | ORAL_TABLET | Freq: Every day | ORAL | 0 refills | Status: DC
  Filled 2021-02-19: qty 90, 90d supply, fill #0

## 2021-02-20 ENCOUNTER — Other Ambulatory Visit (HOSPITAL_COMMUNITY): Payer: Self-pay

## 2021-03-04 NOTE — Progress Notes (Signed)
Cardiology Office Note:    Date:  03/05/2021   ID:  Isaac [name], Isaac [name] 10/24/1951, MRN [id number]  PCP:  Josetta Huddle, MD  Cardiologist:  Sinclair Grooms, MD   Referring MD: Josetta Huddle, MD   Chief Complaint  Patient presents with  . Coronary Artery Disease  . Congestive Heart Failure  . Atrial Fibrillation    History of Present Illness:    Isaac [name] is a 70 y.o. male with a hx of CAD/s/p bypass2010, paroxsymal afib and ablation 11/17 (failed tikosyn), atretic LADand non-ischemic nuclear [phone], chronic combined systolic and diastolic HF(echo EF 10-62% 2019), and chronic Eliquis anticoagulation.  He is not return to tennis as a routine physical activity.  He is physically active, using the stationary bike several times per week and also walking as his major modes of physical activity.  No exercise-induced symptoms of shortness of breath, angina, or other complaints.  Has occasional irregular heartbeat.  No neurological symptoms.  No bleeding on Eliquis.  Past Medical History:  Diagnosis Date  . CERVICAL SPINE DISORDER, NOS   . Chronic systolic (congestive) heart failure (HCC)    EF 40%  . Coronary artery disease    a. s/p CABG  . 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 METATARSALGIA   . OSTEOARTHROSIS, LOCAL, SCND, HAND   . Paroxysmal atrial fibrillation [facility])     Past Surgical History:  Procedure Laterality Date  . APPENDECTOMY    . CHOLECYSTECTOMY    . CORONARY ARTERY BYPASS GRAFT  10/2009  . ELECTROPHYSIOLOGIC STUDY N/A 09/17/2016   Procedure: Atrial Fibrillation Ablation;  Surgeon: Thompson Grayer, MD;  Location: [place] [facility];  Service: Cardiovascular;  Laterality: N/A;    Current Medications: Current Meds  Medication Sig  . acetaminophen (TYLENOL) 325 MG tablet Take 325 mg by mouth every 6 (six) hours as needed for mild pain.  Marland Kitchen apixaban (ELIQUIS) 5 MG TABS tablet TAKE 1 TABLET BY MOUTH 2 TIMES DAILY.  . cetirizine (ZYRTEC) 10 MG tablet  Take 10 mg by mouth daily.  . cholecalciferol (VITAMIN D) 400 units TABS tablet Take 3,000 Units by mouth daily.   Marland Kitchen losartan (COZAAR) 25 MG tablet TAKE 1 TABLET BY MOUTH ONCE DAILY.  . metoprolol tartrate (LOPRESSOR) 25 MG tablet TAKE 1/2 TABLET BY MOUTH TWICE DAILY.  . rosuvastatin (CRESTOR) 5 MG tablet Take 1 tablet (5 mg total) by mouth daily. Please keep upcoming appt in April 2022 with Dr. Tamala Julian before anymore refills. Thank you  . tamsulosin (FLOMAX) 0.4 MG CAPS capsule TAKE 1 CAPSULE BY MOUTH ONCE DAILY 30 MINUTES AFTER THE SAME MEAL.  Marland Kitchen VITAMIN D, ERGOCALCIFEROL, PO Take 1 tablet by mouth daily.     Allergies:   Patient has no known allergies.   Social History   Socioeconomic History  . Marital status: Married    Spouse name: Not on file  . Number of children: Not on file  . Years of education: Not on file  . Highest education level: Not on file  Occupational History  . Not on file  Tobacco Use  . Smoking status: Passive Smoke Exposure - Never Smoker  . Smokeless tobacco: Never Used  . Tobacco comment: both parents smoked  Vaping Use  . Vaping Use: Never used  Substance and Sexual Activity  . Alcohol use: Yes    Comment: 3  . Drug use: No  . Sexual activity: Not Currently  Other Topics Concern  . Not on file  Social History Narrative  . Not on file   Social Determinants of Health   Financial Resource Strain: Not on file  Food Insecurity: Not on file  Transportation Needs: Not on file  Physical Activity: Not on file  Stress: Not on file  Social Connections: Not on file     Family History: The patient's family history includes Emphysema in his father and mother.  ROS:   Please see the history of present illness.    Has right arm tremor.  Appetite is stable.  Wife is developing high blood pressure and other vascular issues.  All other systems reviewed and are negative.  EKGs/Labs/Other Studies Reviewed:    The following studies were reviewed today: Last  echocardiogram 2019.  EF was 40 to 45%.  EKG:  EKG sinus bradycardia, 59 bpm.  PR interval 162 ms.  QS pattern V1 through V4.  Unchanged from prior tracings.  Recent Labs: No results found for requested labs within last 8760 hours.  Recent Lipid Panel    Component Value Date/Time   CHOL 125 12/31/2017 1120   TRIG 86 12/31/2017 1120   HDL 48 12/31/2017 1120   CHOLHDL 2.6 12/31/2017 1120   CHOLHDL 2.9 05/22/2012 0221   VLDL 21 05/22/2012 0221   LDLCALC 60 12/31/2017 1120    Physical Exam:    VS:  BP 110/66   Pulse (!) 59   Ht 5\' 10"  (1.778 m)   Wt 153 lb 12.8 oz (69.8 kg)   SpO2 98%   BMI 22.07 kg/m     Wt Readings from Last 3 Encounters:  03/05/21 153 lb 12.8 oz (69.8 kg)  01/14/20 156 lb 12.8 oz (71.1 kg)  07/07/19 155 lb (70.3 kg)     GEN: Slender. No acute distress HEENT: Normal NECK: No JVD. LYMPHATICS: No lymphadenopathy CARDIAC: No murmur. RRR S4 gallop, or edema. VASCULAR:  Normal Pulses. No bruits. RESPIRATORY:  Clear to auscultation without rales, wheezing or rhonchi  ABDOMEN: Soft, non-tender, non-distended, No pulsatile mass, MUSCULOSKELETAL: No deformity  SKIN: Warm and dry NEUROLOGIC:  Alert and oriented x 3 PSYCHIATRIC:  Normal affect   ASSESSMENT:    1. Coronary artery disease involving coronary bypass graft of native heart with angina pectoris (HCC)   2. Paroxysmal atrial fibrillation ([place])   3. Hyperlipidemia LDL goal <70   4. Chronic combined systolic and diastolic CHF, NYHA class 2 ([place])   5. Chronic anticoagulation    PLAN:    In order of problems listed above:  1. Secondary prevention discussed 2. Occasional palpitations 3. Continue statin therapy with Crestor 5 mg/day.  Most recent LDL was in the 60s. 4. Continue Cozaar 25 mg daily, Lopressor 12.5 mg twice daily.  He is medication sensitive relative to his blood pressure.  2D Doppler echocardiogram will be done prior to the next office visit. 5. Continue Eliquis 5 mg twice  daily.  Overall education and awareness concerning primary/secondary risk prevention was discussed in detail: LDL less than 70, hemoglobin A1c less than 7, blood pressure target less than 130/80 mmHg, >150 minutes of moderate aerobic activity per week, avoidance of smoking, weight control (via diet and exercise), and continued surveillance/management of/for obstructive sleep apnea.    Medication Adjustments/Labs and Tests Ordered: Current medicines are reviewed at length with the patient today.  Concerns regarding medicines are outlined above.  Orders Placed This Encounter  Procedures  . EKG 12-Lead   No orders of the defined types were placed in this encounter.   There  are no Patient Instructions on file for this visit.   Signed, Sinclair Grooms, MD  03/05/2021 11:16 AM    [place]

## 2021-03-05 ENCOUNTER — Encounter: Payer: Self-pay | Admitting: Interventional Cardiology

## 2021-03-05 ENCOUNTER — Other Ambulatory Visit (HOSPITAL_COMMUNITY): Payer: Self-pay

## 2021-03-05 ENCOUNTER — Ambulatory Visit: Payer: Medicare Other | Admitting: Interventional Cardiology

## 2021-03-05 ENCOUNTER — Other Ambulatory Visit: Payer: Self-pay

## 2021-03-05 VITALS — BP 110/66 | HR 59 | Ht 70.0 in | Wt 153.8 lb

## 2021-03-05 DIAGNOSIS — E785 Hyperlipidemia, unspecified: Secondary | ICD-10-CM

## 2021-03-05 DIAGNOSIS — I480 Paroxysmal atrial fibrillation: Secondary | ICD-10-CM

## 2021-03-05 DIAGNOSIS — Z7901 Long term (current) use of anticoagulants: Secondary | ICD-10-CM | POA: Diagnosis not present

## 2021-03-05 DIAGNOSIS — I25709 Atherosclerosis of coronary artery bypass graft(s), unspecified, with unspecified angina pectoris: Secondary | ICD-10-CM | POA: Diagnosis not present

## 2021-03-05 DIAGNOSIS — I5042 Chronic combined systolic (congestive) and diastolic (congestive) heart failure: Secondary | ICD-10-CM

## 2021-03-05 DIAGNOSIS — R198 Other specified symptoms and signs involving the digestive system and abdomen: Secondary | ICD-10-CM | POA: Diagnosis not present

## 2021-03-05 MED FILL — Losartan Potassium Tab 25 MG: ORAL | 30 days supply | Qty: 30 | Fill #0 | Status: AC

## 2021-03-05 MED FILL — Apixaban Tab 5 MG: ORAL | 90 days supply | Qty: 180 | Fill #0 | Status: AC

## 2021-03-05 NOTE — Patient Instructions (Signed)
Medication Instructions:  Your physician recommends that you continue on your current medications as directed. Please refer to the Current Medication list given to you today.  *If you need a refill on your cardiac medications before your next appointment, please call your pharmacy*   Lab Work: NONE If you have labs (blood work) drawn today and your tests are completely normal, you will receive your results only by: Marland Kitchen MyChart Message (if you have MyChart) OR . A paper copy in the mail If you have any lab test that is abnormal or we need to change your treatment, we will call you to review the results.   Testing/Procedures: Your physician has requested that you have an echocardiogram. Echocardiography is a painless test that uses sound waves to create images of your heart. It provides your doctor with information about the size and shape of your heart and how well your heart's chambers and valves are working. This procedure takes approximately one hour. There are no restrictions for this procedure.    Follow-Up: At [facility], you and your health needs are our priority.  As part of our continuing mission to provide you with exceptional heart care, we have created designated Provider Care Teams.  These Care Teams include your primary Cardiologist (physician) and Advanced Practice Providers (APPs -  Physician Assistants and Nurse Practitioners) who all work together to provide you with the care you need, when you need it.  We recommend signing up for the patient portal called "MyChart".  Sign up information is provided on this After Visit Summary.  MyChart is used to connect with patients for Virtual Visits (Telemedicine).  Patients are able to view lab/test results, encounter notes, upcoming appointments, etc.  Non-urgent messages can be sent to your provider as well.   To learn more about what you can do with MyChart, go to [email or web address].    Your next appointment:   12  month(s)  The format for your next appointment:   In Person  Provider:   You may see Sinclair Grooms, MD or one of the following Advanced Practice Providers on your designated Care Team:    Kathyrn Drown, NP    Other Instructions  Echocardiogram An echocardiogram is a test that uses sound waves (ultrasound) to produce images of the heart. Images from an echocardiogram can provide important information about:  Heart size and shape.  The size and thickness and movement of your heart's walls.  Heart muscle function and strength.  Heart valve function or if you have stenosis. Stenosis is when the heart valves are too narrow.  If blood is flowing backward through the heart valves (regurgitation).  A tumor or infectious growth around the heart valves.  Areas of heart muscle that are not working well because of poor blood flow or injury from a heart attack.  Aneurysm detection. An aneurysm is a weak or damaged part of an artery wall. The wall bulges out from the normal force of blood pumping through the body. Tell a health care provider about:  Any allergies you have.  All medicines you are taking, including vitamins, herbs, eye drops, creams, and over-the-counter medicines.  Any blood disorders you have.  Any surgeries you have had.  Any medical conditions you have.  Whether you are pregnant or may be pregnant. What are the risks? Generally, this is a safe test. However, problems may occur, including an allergic reaction to dye (contrast) that may be used during the test. What happens before the  test? No specific preparation is needed. You may eat and drink normally. What happens during the test?  You will take off your clothes from the waist up and put on a hospital gown.  Electrodes or electrocardiogram (ECG)patches may be placed on your chest. The electrodes or patches are then connected to a device that monitors your heart rate and rhythm.  You will lie down on  a table for an ultrasound exam. A gel will be applied to your chest to help sound waves pass through your skin.  A handheld device, called a transducer, will be pressed against your chest and moved over your heart. The transducer produces sound waves that travel to your heart and bounce back (or "echo" back) to the transducer. These sound waves will be captured in real-time and changed into images of your heart that can be viewed on a video monitor. The images will be recorded on a computer and reviewed by your health care provider.  You may be asked to change positions or hold your breath for a short time. This makes it easier to get different views or better views of your heart.  In some cases, you may receive contrast through an IV in one of your veins. This can improve the quality of the pictures from your heart. The procedure may vary among health care providers and hospitals.   What can I expect after the test? You may return to your normal, everyday life, including diet, activities, and medicines, unless your health care provider tells you not to do that. Follow these instructions at home:  It is up to you to get the results of your test. Ask your health care provider, or the department that is doing the test, when your results will be ready.  Keep all follow-up visits. This is important. Summary  An echocardiogram is a test that uses sound waves (ultrasound) to produce images of the heart.  Images from an echocardiogram can provide important information about the size and shape of your heart, heart muscle function, heart valve function, and other possible heart problems.  You do not need to do anything to prepare before this test. You may eat and drink normally.  After the echocardiogram is completed, you may return to your normal, everyday life, unless your health care provider tells you not to do that. This information is not intended to replace advice given to you by your health  care provider. Make sure you discuss any questions you have with your health care provider. Document Revised: 06/20/2020 Document Reviewed: 06/20/2020 Elsevier Patient Education  2021 [employer].

## 2021-03-07 ENCOUNTER — Other Ambulatory Visit (HOSPITAL_COMMUNITY): Payer: Self-pay

## 2021-03-22 ENCOUNTER — Other Ambulatory Visit (HOSPITAL_BASED_OUTPATIENT_CLINIC_OR_DEPARTMENT_OTHER): Payer: Self-pay

## 2021-03-22 ENCOUNTER — Ambulatory Visit: Payer: Medicare Other | Attending: Internal Medicine

## 2021-03-22 ENCOUNTER — Other Ambulatory Visit: Payer: Self-pay

## 2021-03-22 DIAGNOSIS — Z23 Encounter for immunization: Secondary | ICD-10-CM

## 2021-03-22 MED ORDER — PFIZER-BIONT COVID-19 VAC-TRIS 30 MCG/0.3ML IM SUSP
INTRAMUSCULAR | 0 refills | Status: DC
  Filled 2021-03-22: qty 0.3, 1d supply, fill #0

## 2021-03-22 NOTE — Progress Notes (Signed)
   Covid-19 Vaccination Clinic  Name:  Isaac [name]    MRN: [id number] DOB: Jun 09, 1951  03/22/2021  Mr. [name] was observed post Covid-19 immunization for 15 minutes without incident. He was provided with Vaccine Information Sheet and instruction to access the V-Safe system.   Mr. [name] was instructed to call 911 with any severe reactions post vaccine: Marland Kitchen Difficulty breathing  . Swelling of face and throat  . A fast heartbeat  . A bad rash all over body  . Dizziness and weakness   Immunizations Administered    Name Date Dose VIS Date Route   PFIZER Comrnaty(Gray TOP) Covid-19 Vaccine 03/22/2021 10:20 AM 0.3 mL 10/19/2020 Intramuscular   Manufacturer: [employer]   Lot: [id number]   [place]: [phone]

## 2021-03-28 ENCOUNTER — Other Ambulatory Visit (HOSPITAL_COMMUNITY): Payer: Self-pay

## 2021-04-04 ENCOUNTER — Other Ambulatory Visit: Payer: Self-pay | Admitting: Interventional Cardiology

## 2021-04-04 ENCOUNTER — Other Ambulatory Visit (HOSPITAL_COMMUNITY): Payer: Self-pay

## 2021-04-04 MED ORDER — METOPROLOL TARTRATE 25 MG PO TABS
12.5000 mg | ORAL_TABLET | Freq: Two times a day (BID) | ORAL | 3 refills | Status: DC
  Filled 2021-04-04: qty 90, 90d supply, fill #0
  Filled 2021-07-03: qty 90, 90d supply, fill #1
  Filled 2021-09-27: qty 90, 90d supply, fill #2
  Filled 2021-12-31: qty 90, 90d supply, fill #3

## 2021-04-04 MED ORDER — LOSARTAN POTASSIUM 25 MG PO TABS
25.0000 mg | ORAL_TABLET | Freq: Every day | ORAL | 3 refills | Status: DC
  Filled 2021-04-04: qty 90, 90d supply, fill #0
  Filled 2021-07-03: qty 90, 90d supply, fill #1
  Filled 2021-09-27: qty 90, 90d supply, fill #2
  Filled 2021-12-31: qty 90, 90d supply, fill #3

## 2021-04-06 DIAGNOSIS — I2510 Atherosclerotic heart disease of native coronary artery without angina pectoris: Secondary | ICD-10-CM | POA: Diagnosis not present

## 2021-04-06 DIAGNOSIS — I5032 Chronic diastolic (congestive) heart failure: Secondary | ICD-10-CM | POA: Diagnosis not present

## 2021-04-06 DIAGNOSIS — I5042 Chronic combined systolic (congestive) and diastolic (congestive) heart failure: Secondary | ICD-10-CM | POA: Diagnosis not present

## 2021-04-06 DIAGNOSIS — I4891 Unspecified atrial fibrillation: Secondary | ICD-10-CM | POA: Diagnosis not present

## 2021-04-06 DIAGNOSIS — E039 Hypothyroidism, unspecified: Secondary | ICD-10-CM | POA: Diagnosis not present

## 2021-04-06 DIAGNOSIS — I480 Paroxysmal atrial fibrillation: Secondary | ICD-10-CM | POA: Diagnosis not present

## 2021-04-20 DIAGNOSIS — R194 Change in bowel habit: Secondary | ICD-10-CM | POA: Diagnosis not present

## 2021-04-24 DIAGNOSIS — R194 Change in bowel habit: Secondary | ICD-10-CM | POA: Diagnosis not present

## 2021-04-30 ENCOUNTER — Other Ambulatory Visit (HOSPITAL_COMMUNITY): Payer: Self-pay

## 2021-04-30 MED FILL — Tamsulosin HCl Cap 0.4 MG: ORAL | 90 days supply | Qty: 90 | Fill #0 | Status: AC

## 2021-05-02 DIAGNOSIS — Z0000 Encounter for general adult medical examination without abnormal findings: Secondary | ICD-10-CM | POA: Diagnosis not present

## 2021-05-02 DIAGNOSIS — H532 Diplopia: Secondary | ICD-10-CM | POA: Diagnosis not present

## 2021-05-02 DIAGNOSIS — E039 Hypothyroidism, unspecified: Secondary | ICD-10-CM | POA: Diagnosis not present

## 2021-05-02 DIAGNOSIS — I5042 Chronic combined systolic (congestive) and diastolic (congestive) heart failure: Secondary | ICD-10-CM | POA: Diagnosis not present

## 2021-05-02 DIAGNOSIS — E559 Vitamin D deficiency, unspecified: Secondary | ICD-10-CM | POA: Diagnosis not present

## 2021-05-02 DIAGNOSIS — D6869 Other thrombophilia: Secondary | ICD-10-CM | POA: Diagnosis not present

## 2021-05-02 DIAGNOSIS — Z79899 Other long term (current) drug therapy: Secondary | ICD-10-CM | POA: Diagnosis not present

## 2021-05-02 DIAGNOSIS — I480 Paroxysmal atrial fibrillation: Secondary | ICD-10-CM | POA: Diagnosis not present

## 2021-05-04 ENCOUNTER — Telehealth: Payer: Self-pay | Admitting: Interventional Cardiology

## 2021-05-04 ENCOUNTER — Other Ambulatory Visit (HOSPITAL_COMMUNITY): Payer: Self-pay

## 2021-05-04 MED ORDER — MESALAMINE 1000 MG RE SUPP
1000.0000 mg | Freq: Every day | RECTAL | 1 refills | Status: DC
  Filled 2021-05-04: qty 28, 28d supply, fill #0

## 2021-05-04 NOTE — Telephone Encounter (Signed)
Left a message for pt to call back

## 2021-05-04 NOTE — Telephone Encounter (Signed)
Spoke with the patient who states that he had his TSH checked at his PCP and it was 7.35. He states that Dr. Tamala Julian had previously asked him about his thyroid so he wanted to make Dr. Tamala Julian aware.  Patient's PCP did not advised anything further based on the results.

## 2021-05-04 NOTE — Telephone Encounter (Signed)
I need to see the recent labs if possible. Generally speaking, this suggests the thyroid is undeactive. More blood work by PCP can be done to confirm if there is a problem. If thyroid is underactive, it should be treated.

## 2021-05-04 NOTE — Telephone Encounter (Signed)
Lab results have been requested.

## 2021-05-04 NOTE — Telephone Encounter (Signed)
    Pt is returning call, he said to call him on [phone]

## 2021-05-04 NOTE — Telephone Encounter (Signed)
Patient states he recently had lab work with his PCP and his thyroid value was higher than normal, at 7.35. He would like to make Dr. Tamala Julian aware and see if he has any recommendations. Please advise.

## 2021-05-10 DIAGNOSIS — I4891 Unspecified atrial fibrillation: Secondary | ICD-10-CM | POA: Diagnosis not present

## 2021-05-10 DIAGNOSIS — E039 Hypothyroidism, unspecified: Secondary | ICD-10-CM | POA: Diagnosis not present

## 2021-05-10 DIAGNOSIS — R946 Abnormal results of thyroid function studies: Secondary | ICD-10-CM | POA: Diagnosis not present

## 2021-05-10 DIAGNOSIS — I5042 Chronic combined systolic (congestive) and diastolic (congestive) heart failure: Secondary | ICD-10-CM | POA: Diagnosis not present

## 2021-05-10 DIAGNOSIS — I5032 Chronic diastolic (congestive) heart failure: Secondary | ICD-10-CM | POA: Diagnosis not present

## 2021-05-10 DIAGNOSIS — I2510 Atherosclerotic heart disease of native coronary artery without angina pectoris: Secondary | ICD-10-CM | POA: Diagnosis not present

## 2021-05-10 DIAGNOSIS — I480 Paroxysmal atrial fibrillation: Secondary | ICD-10-CM | POA: Diagnosis not present

## 2021-05-10 DIAGNOSIS — R7989 Other specified abnormal findings of blood chemistry: Secondary | ICD-10-CM | POA: Diagnosis not present

## 2021-05-21 ENCOUNTER — Other Ambulatory Visit: Payer: Self-pay | Admitting: Interventional Cardiology

## 2021-05-22 ENCOUNTER — Other Ambulatory Visit (HOSPITAL_COMMUNITY): Payer: Self-pay

## 2021-05-22 MED ORDER — ROSUVASTATIN CALCIUM 5 MG PO TABS
5.0000 mg | ORAL_TABLET | Freq: Every day | ORAL | 2 refills | Status: DC
  Filled 2021-05-22: qty 90, 90d supply, fill #0
  Filled 2021-08-20: qty 90, 90d supply, fill #1
  Filled 2021-11-18: qty 90, 90d supply, fill #2

## 2021-05-22 NOTE — Telephone Encounter (Signed)
I sent rx in the wrong providers name.   I called the pharmacy to have them change it. Dr Darliss Ridgel NPI number was given and pharmacy tech stated she would change the provider name.

## 2021-05-31 ENCOUNTER — Other Ambulatory Visit: Payer: Self-pay | Admitting: Interventional Cardiology

## 2021-05-31 ENCOUNTER — Other Ambulatory Visit (HOSPITAL_COMMUNITY): Payer: Self-pay

## 2021-05-31 MED ORDER — APIXABAN 5 MG PO TABS
5.0000 mg | ORAL_TABLET | Freq: Two times a day (BID) | ORAL | 1 refills | Status: DC
  Filled 2021-05-31: qty 60, 30d supply, fill #0
  Filled 2021-07-03: qty 180, 90d supply, fill #1
  Filled 2021-09-27: qty 120, 60d supply, fill #2

## 2021-05-31 NOTE — Telephone Encounter (Signed)
Pt last saw Dr Tamala Julian 03/05/21, last labs 05/02/21 Creat 1.08, age 70, weight 69.8, based on specified criteria pt is on appropriate dosage of Eliquis 5mg  BID.  Will refill rx.

## 2021-06-26 DIAGNOSIS — R198 Other specified symptoms and signs involving the digestive system and abdomen: Secondary | ICD-10-CM | POA: Diagnosis not present

## 2021-07-03 ENCOUNTER — Other Ambulatory Visit (HOSPITAL_COMMUNITY): Payer: Self-pay

## 2021-07-29 ENCOUNTER — Other Ambulatory Visit (HOSPITAL_COMMUNITY): Payer: Self-pay

## 2021-07-31 ENCOUNTER — Other Ambulatory Visit (HOSPITAL_COMMUNITY): Payer: Self-pay

## 2021-07-31 MED ORDER — TAMSULOSIN HCL 0.4 MG PO CAPS
0.4000 mg | ORAL_CAPSULE | Freq: Every day | ORAL | 3 refills | Status: DC
  Filled 2021-07-31: qty 90, 90d supply, fill #0
  Filled 2021-10-28: qty 90, 90d supply, fill #1
  Filled 2022-01-28: qty 90, 90d supply, fill #2
  Filled 2022-04-24: qty 90, 90d supply, fill #3

## 2021-08-20 ENCOUNTER — Other Ambulatory Visit (HOSPITAL_COMMUNITY): Payer: Self-pay

## 2021-09-03 ENCOUNTER — Other Ambulatory Visit (HOSPITAL_BASED_OUTPATIENT_CLINIC_OR_DEPARTMENT_OTHER): Payer: Self-pay

## 2021-09-03 ENCOUNTER — Ambulatory Visit: Payer: Medicare Other | Attending: Internal Medicine

## 2021-09-03 ENCOUNTER — Other Ambulatory Visit: Payer: Self-pay

## 2021-09-03 DIAGNOSIS — Z23 Encounter for immunization: Secondary | ICD-10-CM

## 2021-09-03 MED ORDER — PFIZER COVID-19 VAC BIVALENT 30 MCG/0.3ML IM SUSP
INTRAMUSCULAR | 0 refills | Status: DC
  Filled 2021-09-03: qty 0.3, 1d supply, fill #0

## 2021-09-03 NOTE — Progress Notes (Signed)
   Covid-19 Vaccination Clinic  Name:  Isaac [name]    MRN: [id number] DOB: October 14, 1951  09/03/2021  Mr. [name] was observed post Covid-19 immunization for 15 minutes without incident. He was provided with Vaccine Information Sheet and instruction to access the V-Safe system.   Mr. [name] was instructed to call 911 with any severe reactions post vaccine: Difficulty breathing  Swelling of face and throat  A fast heartbeat  A bad rash all over body  Dizziness and weakness   Immunizations Administered     Name Date Dose VIS Date Route   Pfizer Covid-19 Vaccine Bivalent Booster 09/03/2021  9:50 AM 0.3 mL 07/11/2021 Intramuscular   Manufacturer: [place]   Lot: [id number]   [place]: [phone]

## 2021-09-17 ENCOUNTER — Other Ambulatory Visit (HOSPITAL_BASED_OUTPATIENT_CLINIC_OR_DEPARTMENT_OTHER): Payer: Self-pay

## 2021-09-17 MED ORDER — INFLUENZA VAC A&B SA ADJ QUAD 0.5 ML IM PRSY
PREFILLED_SYRINGE | INTRAMUSCULAR | 0 refills | Status: DC
  Filled 2021-09-17: qty 0.5, 1d supply, fill #0

## 2021-09-27 ENCOUNTER — Other Ambulatory Visit (HOSPITAL_COMMUNITY): Payer: Self-pay

## 2021-10-01 ENCOUNTER — Other Ambulatory Visit (HOSPITAL_COMMUNITY): Payer: Self-pay

## 2021-10-29 ENCOUNTER — Other Ambulatory Visit (HOSPITAL_COMMUNITY): Payer: Self-pay

## 2021-11-19 ENCOUNTER — Other Ambulatory Visit: Payer: Self-pay

## 2021-11-19 ENCOUNTER — Ambulatory Visit: Payer: Medicare Other | Admitting: Interventional Cardiology

## 2021-11-19 ENCOUNTER — Encounter: Payer: Self-pay | Admitting: Interventional Cardiology

## 2021-11-19 ENCOUNTER — Ambulatory Visit (INDEPENDENT_AMBULATORY_CARE_PROVIDER_SITE_OTHER): Payer: Medicare Other

## 2021-11-19 ENCOUNTER — Other Ambulatory Visit (HOSPITAL_COMMUNITY): Payer: Self-pay

## 2021-11-19 ENCOUNTER — Telehealth: Payer: Self-pay | Admitting: Interventional Cardiology

## 2021-11-19 VITALS — BP 114/68 | HR 59 | Ht 70.0 in | Wt 152.8 lb

## 2021-11-19 DIAGNOSIS — I5042 Chronic combined systolic (congestive) and diastolic (congestive) heart failure: Secondary | ICD-10-CM

## 2021-11-19 DIAGNOSIS — I25709 Atherosclerosis of coronary artery bypass graft(s), unspecified, with unspecified angina pectoris: Secondary | ICD-10-CM | POA: Diagnosis not present

## 2021-11-19 DIAGNOSIS — I480 Paroxysmal atrial fibrillation: Secondary | ICD-10-CM | POA: Diagnosis not present

## 2021-11-19 DIAGNOSIS — I493 Ventricular premature depolarization: Secondary | ICD-10-CM

## 2021-11-19 DIAGNOSIS — Z7901 Long term (current) use of anticoagulants: Secondary | ICD-10-CM

## 2021-11-19 DIAGNOSIS — E785 Hyperlipidemia, unspecified: Secondary | ICD-10-CM

## 2021-11-19 NOTE — Telephone Encounter (Signed)
Patient c/o Palpitations:  High priority if patient c/o lightheadedness, shortness of breath, or chest pain  How long have you had palpitations/irregular HR/ Afib? Are you having the symptoms now? Last 4 hours, yes  Are you currently experiencing lightheadedness, SOB or CP? slight SOB  Do you have a history of afib (atrial fibrillation) or irregular heart rhythm? yes  Have you checked your BP or HR? (document readings if available): no   Are you experiencing any other symptoms? No   Patient states he has been having increased afib during rest. He states he has been in it today for about 4 hours and has slight SOB. He states he had an ablation 5 years ago.

## 2021-11-19 NOTE — Progress Notes (Signed)
Cardiology Office Note:    Date:  11/19/2021   ID:  Isaac [name], DOB 04-23-1951, MRN [id number]  PCP:  Josetta Huddle, MD  Cardiologist:  Sinclair Grooms, MD   Referring MD: Josetta Huddle, MD   Chief Complaint  Patient presents with   Shortness of Breath   Congestive Heart Failure    History of Present Illness:    SOLLY DERASMO is a 71 y.o. male with a hx of CAD/s/p bypass 2010, paroxsymal afib and ablation 11/17 (failed tikosyn), atretic LAD and non-ischemic nuclear study 2017, chronic combined systolic and diastolic HF(echo EF 06-26% 2019), and chronic Eliquis anticoagulation.  Has noted "A. fib" which he feels is present when there is a somewhat regular irregularity to his rhythm, mostly when he is sitting.  There is also some sense of shortness of breath.  When he is up moving around the irregularity goes away and his breathing does fine.  With the above complaint there is a sensation of tightness in the chest.  This is also not present with physical activity.  Heart rates have not been elevated.  Past Medical History:  Diagnosis Date   CERVICAL SPINE DISORDER, NOS    Chronic systolic (congestive) heart failure (HCC)    EF 40%   Coronary artery disease    a. 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   apixaban (ELIQUIS) 5 MG TABS tablet Take 1 tablet (5 mg total) by mouth 2 (two) times daily.    cetirizine (ZYRTEC) 10 MG tablet Take 10 mg by mouth daily.   cholecalciferol (VITAMIN D) 400 units TABS tablet Take 3,000 Units by mouth daily.    COVID-19 mRNA bivalent vaccine, Pfizer, (PFIZER COVID-19 VAC BIVALENT) injection Inject into the muscle.   COVID-19 mRNA Vac-TriS, Pfizer, (PFIZER-BIONT COVID-19 VAC-TRIS) SUSP injection Inject into the muscle.   influenza vaccine adjuvanted (FLUAD) 0.5 ML injection Inject into the muscle.   losartan (COZAAR) 25 MG tablet Take 1 tablet (25 mg total) by mouth daily.   metoprolol tartrate (LOPRESSOR) 25 MG tablet Take 1/2 tablet (12.5 mg total) by mouth 2 (two) times daily.   rosuvastatin (CRESTOR) 5 MG tablet Take 1 tablet (5 mg total) by mouth daily.   tamsulosin (FLOMAX) 0.4 MG CAPS capsule TAKE 1 CAPSULE BY MOUTH ONCE DAILY 30 MINUTES AFTER THE SAME MEAL.     Allergies:   Patient has no known allergies.   Social History   Socioeconomic History   Marital status: Married    Spouse name: Not on file   Number of children: Not on file   Years of education: Not on file   Highest education level: Not on file  Occupational History   Not on file  Tobacco Use   Smoking status: Passive Smoke Exposure - Never Smoker   Smokeless tobacco: Never   Tobacco comments:  both parents smoked  Vaping Use   Vaping Use: Never used  Substance and Sexual Activity   Alcohol use: Yes    Comment: 3   Drug use: No   Sexual activity: Not Currently  Other Topics Concern   Not on file  Social History Narrative   Not on file   Social Determinants of Health   Financial Resource Strain: Not on file  Food Insecurity: Not on file  Transportation Needs: Not on file  Physical Activity: Not on file  Stress: Not on file  Social Connections: Not on file     Family History: The patient's family history includes Emphysema in his father and mother.  ROS:   Please see the history of present illness.    Denies orthopnea, syncope, rapid heartbeat, edema, and  bleeding on anticoagulation therapy.  All other systems reviewed and are negative.  EKGs/Labs/Other Studies Reviewed:    The following studies were reviewed today: 2D Doppler echocardiogram 2019: Study Conclusions   - Left ventricle: The cavity size was normal. Wall thickness was    normal. Systolic function was mildly to moderately reduced. The    estimated ejection fraction was in the range of 40% to 45%.    Features are consistent with a pseudonormal left ventricular    filling pattern, with concomitant abnormal relaxation and    increased filling pressure (grade 2 diastolic dysfunction).  - Mitral valve: There was mild regurgitation.  - Right atrium: The atrium was mildly dilated.   EKG:  EKG sinus bradycardia, occasional PVC, QS pattern V1 through V4.  When compared to prior tracing most recently in March 2021, there is no change.  Recent Labs: No results found for requested labs within last 8760 hours.  Recent Lipid Panel    Component Value Date/Time   CHOL 125 12/31/2017 1120   TRIG 86 12/31/2017 1120   HDL 48 12/31/2017 1120   CHOLHDL 2.6 12/31/2017 1120   CHOLHDL 2.9 05/22/2012 0221   VLDL 21 05/22/2012 0221   LDLCALC 60 12/31/2017 1120    Physical Exam:    VS:  BP 114/68    Pulse (!) 59    Ht 5\' 10"  (1.778 m)    Wt 152 lb 12.8 oz (69.3 kg)    SpO2 97%    BMI 21.92 kg/m     Wt Readings from Last 3 Encounters:  11/19/21 152 lb 12.8 oz (69.3 kg)  03/05/21 153 lb 12.8 oz (69.8 kg)  01/14/20 156 lb 12.8 oz (71.1 kg)     GEN: Slender. No acute distress HEENT: Normal NECK: No JVD. LYMPHATICS: No lymphadenopathy CARDIAC: No murmur. RRR no gallop, or edema. VASCULAR:  Normal Pulses. No bruits. RESPIRATORY:  Clear to auscultation without rales, wheezing or rhonchi  ABDOMEN: Soft, non-tender, non-distended, No pulsatile mass, MUSCULOSKELETAL: No deformity  SKIN: Warm and dry NEUROLOGIC:  Alert and oriented x 3 PSYCHIATRIC:  Normal affect   ASSESSMENT:    1.  Chronic combined systolic and diastolic CHF, NYHA class 2 ([place])   2. PVC (premature ventricular contraction)   3. Paroxysmal atrial fibrillation (HCC)   4. Coronary artery disease involving coronary bypass graft of native heart with angina pectoris ([place])   5. Hyperlipidemia LDL goal <70   6. Chronic anticoagulation    PLAN:    In order of problems listed above:  2D Doppler echocardiogram to reassess LV systolic function.  Need to update heart failure regimen 7-day monitor to quantitate PVCs and to exclude A. fib. Same  as above Secondary prevention discussed Continue statin therapy Continue Eliquis.   Medication Adjustments/Labs and Tests Ordered: Current medicines are reviewed at length with the patient today.  Concerns regarding medicines are outlined above.  Orders Placed This Encounter  Procedures   LONG TERM MONITOR (3-14 DAYS)   EKG 12-Lead   No orders of the defined types were placed in this encounter.   Patient Instructions  Medication Instructions:  Your physician recommends that you continue on your current medications as directed. Please refer to the Current Medication list given to you today.  *If you need a refill on your cardiac medications before your next appointment, please call your pharmacy*   Lab Work: None If you have labs (blood work) drawn today and your tests are completely normal, you will receive your results only by: [place] (if you have MyChart) OR A paper copy in the mail If you have any lab test that is abnormal or we need to change your treatment, we will call you to review the results.   Testing/Procedures: Your physician has requested that you have an echocardiogram. Echocardiography is a painless test that uses sound waves to create images of your heart. It provides your doctor with information about the size and shape of your heart and how well your hearts chambers and valves are working. This procedure takes approximately one  hour. There are no restrictions for this procedure.  Your physician recommends that you wear a monitor for 7 days.  See next page for instructions.  Follow-Up:  Keep appointment in April  :1}    Other Instructions  ZIO XT- Long Term Monitor Instructions  Your physician has requested you wear a ZIO patch monitor for 7 days.  This is a single patch monitor. [employer] supplies one patch monitor per enrollment. Additional stickers are not available. Please do not apply patch if you will be having a Nuclear Stress Test,  Echocardiogram, Cardiac CT, MRI, or Chest Xray during the period you would be wearing the  monitor. The patch cannot be worn during these tests. You cannot remove and re-apply the  ZIO XT patch monitor.  Your ZIO patch monitor will be mailed 3 day USPS to your address on file. It may take 3-5 days  to receive your monitor after you have been enrolled.  Once you have received your monitor, please review the enclosed instructions. Your monitor  has already been registered assigning a specific monitor serial # to you.  Billing and Patient Assistance Program Information  We have supplied [employer] with any of your insurance information on file for billing purposes. [employer] offers a sliding scale Patient Assistance Program for patients that do not have  insurance, or whose insurance does not completely cover the cost of the ZIO monitor.  You must apply for the Patient Assistance Program to qualify for this discounted rate.  To apply, please call [employer] at [phone], select option 4, select option 2, ask to apply for  Patient Assistance Program. Theodore Demark will ask your household income, and how many people  are in your household. They will quote your out-of-pocket cost based on that information.  [employer] will also be able to set up a 31-month, interest-free payment plan if needed.  Applying the monitor   Shave hair from upper left chest.  Hold abrader disc by orange tab. Rub  abrader in 40 strokes over the upper left chest as  indicated in your monitor instructions.  Clean area with 4 enclosed alcohol pads. Let dry.  Apply patch as indicated in monitor instructions. Patch will be placed under collarbone on left  side of chest with arrow pointing upward.  Rub patch adhesive wings for 2 minutes. Remove white label marked "1". Remove the white  label marked "2". Rub patch adhesive wings for 2 additional minutes.  While looking in a mirror, press and release button in center of patch. A small green light will  flash 3-4 times. This will be your only indicator that the monitor has been turned on.  Do not shower for the first 24 hours. You may shower after the first 24 hours.  Press the button if you feel a symptom. You will hear a small click. Record Date, Time and  Symptom in the Patient Logbook.  When you are ready to remove the patch, follow instructions on the last 2 pages of Patient  Logbook. Stick patch monitor onto the last page of Patient Logbook.  Place Patient Logbook in the blue and white box. Use locking tab on box and tape box closed  securely. The blue and white box has prepaid postage on it. Please place it in the mailbox as  soon as possible. Your physician should have your test results approximately 7 days after the  monitor has been mailed back to [facility].  Call [facility] at [phone] if you have questions regarding  your ZIO XT patch monitor. Call them immediately if you see an orange light blinking on your  monitor.  If your monitor falls off in less than 4 days, contact our Monitor department at [phone].  If your monitor becomes loose or falls off after 4 days call [employer] at [phone] for  suggestions on securing your monitor    Signed, Sinclair Grooms, MD  11/19/2021 3:32 PM    [place]

## 2021-11-19 NOTE — Telephone Encounter (Signed)
Call sent to triage.  Per Pt he has noticed increasing afib episodes over the last 6 weeks.  He can tell when he is out of rhythm and he has mild shortness of breath.  He states only change in the last 6 weeks is he started taking Align.  He states he does not have any trouble exercising.    Advised would send to Dr. Tamala Julian for review.  Last saw Dr. Rayann Heman in [place] back?

## 2021-11-19 NOTE — Progress Notes (Unsigned)
Enrolled for [employer] to mail a ZIO XT long term holter monitor to the patients address on file.  

## 2021-11-19 NOTE — Patient Instructions (Signed)
Medication Instructions:  Your physician recommends that you continue on your current medications as directed. Please refer to the Current Medication list given to you today.  *If you need a refill on your cardiac medications before your next appointment, please call your pharmacy*   Lab Work: None If you have labs (blood work) drawn today and your tests are completely normal, you will receive your results only by: [place] (if you have MyChart) OR A paper copy in the mail If you have any lab test that is abnormal or we need to change your treatment, we will call you to review the results.   Testing/Procedures: Your physician has requested that you have an echocardiogram. Echocardiography is a painless test that uses sound waves to create images of your heart. It provides your doctor with information about the size and shape of your heart and how well your hearts chambers and valves are working. This procedure takes approximately one hour. There are no restrictions for this procedure.  Your physician recommends that you wear a monitor for 7 days.  See next page for instructions.  Follow-Up:  Keep appointment in April  :1}    Other Instructions  ZIO XT- Long Term Monitor Instructions  Your physician has requested you wear a ZIO patch monitor for 7 days.  This is a single patch monitor. [employer] supplies one patch monitor per enrollment. Additional stickers are not available. Please do not apply patch if you will be having a Nuclear Stress Test,  Echocardiogram, Cardiac CT, MRI, or Chest Xray during the period you would be wearing the  monitor. The patch cannot be worn during these tests. You cannot remove and re-apply the  ZIO XT patch monitor.  Your ZIO patch monitor will be mailed 3 day USPS to your address on file. It may take 3-5 days  to receive your monitor after you have been enrolled.  Once you have received your monitor, please review the enclosed instructions. Your  monitor  has already been registered assigning a specific monitor serial # to you.  Billing and Patient Assistance Program Information  We have supplied [employer] with any of your insurance information on file for billing purposes. [employer] offers a sliding scale Patient Assistance Program for patients that do not have  insurance, or whose insurance does not completely cover the cost of the ZIO monitor.  You must apply for the Patient Assistance Program to qualify for this discounted rate.  To apply, please call [employer] at [phone], select option 4, select option 2, ask to apply for  Patient Assistance Program. Theodore Demark will ask your household income, and how many people  are in your household. They will quote your out-of-pocket cost based on that information.  [employer] will also be able to set up a 89-month, interest-free payment plan if needed.  Applying the monitor   Shave hair from upper left chest.  Hold abrader disc by orange tab. Rub abrader in 40 strokes over the upper left chest as  indicated in your monitor instructions.  Clean area with 4 enclosed alcohol pads. Let dry.  Apply patch as indicated in monitor instructions. Patch will be placed under collarbone on left  side of chest with arrow pointing upward.  Rub patch adhesive wings for 2 minutes. Remove white label marked "1". Remove the white  label marked "2". Rub patch adhesive wings for 2 additional minutes.  While looking in a mirror, press and release button in center of patch. A small green light will  flash 3-4 times. This will be your only indicator that the monitor has been turned on.  Do not shower for the first 24 hours. You may shower after the first 24 hours.  Press the button if you feel a symptom. You will hear a small click. Record Date, Time and  Symptom in the Patient Logbook.  When you are ready to remove the patch, follow instructions on the last 2 pages of Patient  Logbook. Stick patch monitor onto the  last page of Patient Logbook.  Place Patient Logbook in the blue and white box. Use locking tab on box and tape box closed  securely. The blue and white box has prepaid postage on it. Please place it in the mailbox as  soon as possible. Your physician should have your test results approximately 7 days after the  monitor has been mailed back to [facility].  Call [place] at [phone] if you have questions regarding  your ZIO XT patch monitor. Call them immediately if you see an orange light blinking on your  monitor.  If your monitor falls off in less than 4 days, contact our Monitor department at [phone].  If your monitor becomes loose or falls off after 4 days call [employer] at [phone] for  suggestions on securing your monitor

## 2021-11-19 NOTE — Telephone Encounter (Signed)
Spoke with pt and scheduled him to come in today for eval.  Pt agreeable to plan.

## 2021-11-20 ENCOUNTER — Ambulatory Visit (HOSPITAL_COMMUNITY): Payer: Medicare Other | Admitting: Physician Assistant

## 2021-11-21 DIAGNOSIS — I493 Ventricular premature depolarization: Secondary | ICD-10-CM | POA: Diagnosis not present

## 2021-11-28 ENCOUNTER — Other Ambulatory Visit: Payer: Self-pay | Admitting: Interventional Cardiology

## 2021-11-28 ENCOUNTER — Other Ambulatory Visit (HOSPITAL_COMMUNITY): Payer: Self-pay

## 2021-11-28 MED ORDER — APIXABAN 5 MG PO TABS
5.0000 mg | ORAL_TABLET | Freq: Two times a day (BID) | ORAL | 1 refills | Status: DC
  Filled 2021-11-28: qty 180, 90d supply, fill #0
  Filled 2022-02-26: qty 180, 90d supply, fill #1

## 2021-11-28 NOTE — Telephone Encounter (Signed)
Prescription refill request for Eliquis received. Indication: Afib  Last office visit: 11/19/21 Tamala Julian)  Scr: 1.08 (05/02/21)  Age: 71 Weight: 69.3kg  Appropriate dose and refill sent to requested pharmacy.

## 2021-11-29 ENCOUNTER — Other Ambulatory Visit: Payer: Self-pay

## 2021-11-29 ENCOUNTER — Ambulatory Visit (HOSPITAL_COMMUNITY): Payer: Medicare Other | Attending: Cardiology

## 2021-11-29 DIAGNOSIS — I5042 Chronic combined systolic (congestive) and diastolic (congestive) heart failure: Secondary | ICD-10-CM | POA: Insufficient documentation

## 2021-11-29 LAB — ECHOCARDIOGRAM COMPLETE
Area-P 1/2: 1.62 cm2
S' Lateral: 3.6 cm

## 2021-11-29 MED ORDER — PERFLUTREN LIPID MICROSPHERE
1.0000 mL | INTRAVENOUS | Status: AC | PRN
  Administered 2021-11-29: 2 mL via INTRAVENOUS

## 2021-12-04 DIAGNOSIS — I493 Ventricular premature depolarization: Secondary | ICD-10-CM | POA: Diagnosis not present

## 2021-12-05 ENCOUNTER — Other Ambulatory Visit (HOSPITAL_COMMUNITY): Payer: Self-pay

## 2021-12-05 ENCOUNTER — Telehealth: Payer: Self-pay | Admitting: *Deleted

## 2021-12-05 DIAGNOSIS — I5042 Chronic combined systolic (congestive) and diastolic (congestive) heart failure: Secondary | ICD-10-CM

## 2021-12-05 MED ORDER — DAPAGLIFLOZIN PROPANEDIOL 10 MG PO TABS
10.0000 mg | ORAL_TABLET | Freq: Every day | ORAL | 11 refills | Status: DC
  Filled 2021-12-05 – 2021-12-26 (×2): qty 30, 30d supply, fill #0

## 2021-12-05 NOTE — Telephone Encounter (Signed)
-----   Message from Belva Crome, MD sent at 12/01/2021 10:28 AM EST ----- Let the patient know the echo is stable c/w 2019. However, I would suggest Farxiga/Jardiance 10 mg daily for increased future protection based on new guidelines. If he agrees, BMET 2 weeks after starting. A copy will be sent to Josetta Huddle, MD

## 2021-12-05 NOTE — Telephone Encounter (Signed)
Spoke with pt and reviewed results and recommendations. Pt agreeable to starting [place].  He will come by the office and pick up samples and a discount card along with the patient assistance information incase it's needed.  Pt will come for labs on 12/21/21.  Pt verbalized understanding and was in agreement with plan.

## 2021-12-11 ENCOUNTER — Other Ambulatory Visit: Payer: Self-pay | Admitting: *Deleted

## 2021-12-11 DIAGNOSIS — I493 Ventricular premature depolarization: Secondary | ICD-10-CM

## 2021-12-11 NOTE — Progress Notes (Signed)
Spoke with pt and reviewed monitor results.  Referral placed for EP.

## 2021-12-13 DIAGNOSIS — Z85828 Personal history of other malignant neoplasm of skin: Secondary | ICD-10-CM | POA: Diagnosis not present

## 2021-12-13 DIAGNOSIS — L821 Other seborrheic keratosis: Secondary | ICD-10-CM | POA: Diagnosis not present

## 2021-12-13 DIAGNOSIS — L853 Xerosis cutis: Secondary | ICD-10-CM | POA: Diagnosis not present

## 2021-12-13 DIAGNOSIS — D2261 Melanocytic nevi of right upper limb, including shoulder: Secondary | ICD-10-CM | POA: Diagnosis not present

## 2021-12-13 DIAGNOSIS — D485 Neoplasm of uncertain behavior of skin: Secondary | ICD-10-CM | POA: Diagnosis not present

## 2021-12-13 DIAGNOSIS — D1801 Hemangioma of skin and subcutaneous tissue: Secondary | ICD-10-CM | POA: Diagnosis not present

## 2021-12-13 DIAGNOSIS — L570 Actinic keratosis: Secondary | ICD-10-CM | POA: Diagnosis not present

## 2021-12-21 ENCOUNTER — Other Ambulatory Visit: Payer: Self-pay

## 2021-12-21 ENCOUNTER — Other Ambulatory Visit: Payer: Medicare Other | Admitting: *Deleted

## 2021-12-21 DIAGNOSIS — I5042 Chronic combined systolic (congestive) and diastolic (congestive) heart failure: Secondary | ICD-10-CM

## 2021-12-21 LAB — BASIC METABOLIC PANEL
BUN/Creatinine Ratio: 21 (ref 10–24)
BUN: 19 mg/dL (ref 8–27)
CO2: 22 mmol/L (ref 20–29)
Calcium: 9.1 mg/dL (ref 8.6–10.2)
Chloride: 107 mmol/L — ABNORMAL HIGH (ref 96–106)
Creatinine, Ser: 0.9 mg/dL (ref 0.76–1.27)
Glucose: 81 mg/dL (ref 70–99)
Potassium: 4.1 mmol/L (ref 3.5–5.2)
Sodium: 142 mmol/L (ref 134–144)
eGFR: 90 mL/min/{1.73_m2} (ref >59)

## 2021-12-26 ENCOUNTER — Other Ambulatory Visit (HOSPITAL_COMMUNITY): Payer: Self-pay

## 2021-12-28 ENCOUNTER — Encounter: Payer: Self-pay | Admitting: Interventional Cardiology

## 2021-12-31 ENCOUNTER — Other Ambulatory Visit (HOSPITAL_COMMUNITY): Payer: Self-pay

## 2022-01-08 ENCOUNTER — Other Ambulatory Visit (HOSPITAL_COMMUNITY): Payer: Self-pay

## 2022-01-10 DIAGNOSIS — H2513 Age-related nuclear cataract, bilateral: Secondary | ICD-10-CM | POA: Diagnosis not present

## 2022-01-10 DIAGNOSIS — H5213 Myopia, bilateral: Secondary | ICD-10-CM | POA: Diagnosis not present

## 2022-01-10 DIAGNOSIS — H43813 Vitreous degeneration, bilateral: Secondary | ICD-10-CM | POA: Diagnosis not present

## 2022-01-14 ENCOUNTER — Other Ambulatory Visit: Payer: Self-pay

## 2022-01-14 ENCOUNTER — Ambulatory Visit: Payer: Medicare Other | Admitting: Internal Medicine

## 2022-01-14 ENCOUNTER — Encounter: Payer: Self-pay | Admitting: Internal Medicine

## 2022-01-14 VITALS — BP 114/76 | HR 58 | Ht 70.0 in | Wt 155.0 lb

## 2022-01-14 DIAGNOSIS — I493 Ventricular premature depolarization: Secondary | ICD-10-CM | POA: Diagnosis not present

## 2022-01-14 DIAGNOSIS — I5042 Chronic combined systolic (congestive) and diastolic (congestive) heart failure: Secondary | ICD-10-CM

## 2022-01-14 DIAGNOSIS — I480 Paroxysmal atrial fibrillation: Secondary | ICD-10-CM

## 2022-01-14 NOTE — Patient Instructions (Addendum)

## 2022-01-14 NOTE — Progress Notes (Signed)
? ? ? ? ?HPI ?Isaac [name] is referred by Dr. Tamala Julian for evaluation of PVC's. 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 He has an EF of 40% which has been present for 12 years. He has developed PAF and after dofetilide failed, underwent catheter ablation. He has had some break through episodes. He had recurrent palpitations several weeks ago and a cardiac monitor demonstrated a little less than 10% PVC's.  ?No Known Allergies ? ? ?Current Outpatient Medications  ?Medication Sig Dispense Refill  ? acetaminophen (TYLENOL) 325 MG tablet Take 325 mg by mouth every 6 (six) hours as needed for mild pain.    ? apixaban (ELIQUIS) 5 MG TABS tablet Take 1 tablet (5 mg total) by mouth 2 (two) times daily. 180 tablet 1  ? cetirizine (ZYRTEC) 10 MG tablet Take 10 mg by mouth daily.    ? cholecalciferol (VITAMIN D) 400 units TABS tablet Take 3,000 Units by mouth daily.     ? COVID-19 mRNA bivalent vaccine, Pfizer, (PFIZER COVID-19 VAC BIVALENT) injection Inject into the muscle. 0.3 mL 0  ? COVID-19 mRNA Vac-TriS, Pfizer, (PFIZER-BIONT COVID-19 VAC-TRIS) SUSP injection Inject into the muscle. 0.3 mL 0  ? dapagliflozin propanediol (FARXIGA) 10 MG TABS tablet Take 1 tablet (10 mg total) by mouth daily before breakfast. 30 tablet 11  ? influenza vaccine adjuvanted (FLUAD) 0.5 ML injection Inject into the muscle. 0.5 mL 0  ? losartan (COZAAR) 25 MG tablet Take 1 tablet (25 mg total) by mouth daily. 90 tablet 3  ? metoprolol tartrate (LOPRESSOR) 25 MG tablet Take 1/2 tablet (12.5 mg total) by mouth 2 (two) times daily. 90 tablet 3  ? rosuvastatin (CRESTOR) 5 MG tablet Take 1 tablet (5 mg total) by mouth daily. 90 tablet 2  ? tamsulosin (FLOMAX) 0.4 MG CAPS capsule TAKE 1 CAPSULE BY MOUTH ONCE DAILY 30 MINUTES AFTER THE SAME MEAL. 90 capsule 3  ? ?No current facility-administered medications for this visit.  ? ? ? ?Past Medical History:  ?Diagnosis Date  ? CERVICAL  SPINE DISORDER, NOS   ? Chronic systolic (congestive) heart failure (HCC)   ? EF 40%  ? Coronary artery disease   ? a. s/p CABG  ? 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 METATARSALGIA   ? OSTEOARTHROSIS, LOCAL, SCND, HAND   ? Paroxysmal atrial fibrillation (HCC)   ? ? ?ROS: ? ? All systems reviewed and negative except as noted in the HPI. ? ? ?Past Surgical History:  ?Procedure Laterality Date  ? APPENDECTOMY    ? CHOLECYSTECTOMY    ? CORONARY ARTERY BYPASS GRAFT  10/2009  ? ELECTROPHYSIOLOGIC STUDY N/A 09/17/2016  ? Procedure: Atrial Fibrillation Ablation;  Surgeon: Thompson Grayer, MD;  Location: [facility];  Service: Cardiovascular;  Laterality: N/A;  ? ? ? ?Family History  ?Problem Relation Age of Onset  ? Emphysema Mother   ? Emphysema Father   ? ? ? ?Social History  ? ?Socioeconomic History  ? Marital status: Married  ?  Spouse name: Not on file  ? Number of children: Not on file  ? Years of education: Not on file  ? Highest education level: Not on file  ?Occupational History  ? Not on file  ?Tobacco Use  ? Smoking status: Passive Smoke Exposure - Never Smoker  ? Smokeless tobacco: Never  ? Tobacco comments:  ?  both parents smoked  ?Vaping Use  ? Vaping  Use: Never used  ?Substance and Sexual Activity  ? Alcohol use: Yes  ?  Comment: 3  ? Drug use: No  ? Sexual activity: Not Currently  ?Other Topics Concern  ? Not on file  ?Social History Narrative  ? Not on file  ? ?Social Determinants of Health  ? ?Financial Resource Strain: Not on file  ?Food Insecurity: Not on file  ?Transportation Needs: Not on file  ?Physical Activity: Not on file  ?Stress: Not on file  ?Social Connections: Not on file  ?Intimate Partner Violence: Not on file  ? ? ? ?BP 114/76   Pulse (!) 58   Ht '5\' 10"'$  (1.778 m)   Wt 155 lb (70.3 kg)   SpO2 96%   BMI 22.24 kg/m?  ? ?Physical Exam: ? ?Well appearing NAD ?HEENT: Unremarkable ?Neck:  No JVD, no thyromegally ?Lymphatics:  No adenopathy ?Back:  No CVA tenderness ?Lungs:  Clear with  no wheezes ?HEART:  Regular rate rhythm, no murmurs, no rubs, no clicks ?Abd:  soft, positive bowel sounds, no organomegally, no rebound, no guarding ?Ext:  2 plus pulses, no edema, no cyanosis, no clubbing ?Skin:  No rashes no nodules ?Neuro:  CN II through XII intact, motor grossly intact ? ?Assess/Plan:  ?PVC's- I discussed the benign nature of his arrhythmias based on less than 10% burdern. In addition, since he was placed on Farxiga, his palpitations have improved. He will undergo watchful waiting. I would not recommend AA drug therapy unless his symptoms worsened. ?ICM - he denies anginal symptoms despite walking vigorously. No change in meds. ?Atrial fib - not seen on his cardiac monitor. Appears to be controlled. Continue eliquis. ?Dyslipidemia - continue statin therapy. ? ?Isaac Overlie See Beharry,MD ?

## 2022-01-17 ENCOUNTER — Other Ambulatory Visit (HOSPITAL_COMMUNITY): Payer: Self-pay

## 2022-01-18 ENCOUNTER — Other Ambulatory Visit (HOSPITAL_COMMUNITY): Payer: Self-pay

## 2022-01-18 ENCOUNTER — Other Ambulatory Visit: Payer: Self-pay | Admitting: Interventional Cardiology

## 2022-01-18 MED ORDER — DAPAGLIFLOZIN PROPANEDIOL 10 MG PO TABS
10.0000 mg | ORAL_TABLET | Freq: Every day | ORAL | 10 refills | Status: DC
  Filled 2022-01-18: qty 30, 30d supply, fill #0
  Filled 2022-02-26: qty 30, 30d supply, fill #1

## 2022-01-21 ENCOUNTER — Other Ambulatory Visit (HOSPITAL_COMMUNITY): Payer: Self-pay

## 2022-01-28 ENCOUNTER — Other Ambulatory Visit (HOSPITAL_COMMUNITY): Payer: Self-pay

## 2022-01-31 ENCOUNTER — Encounter: Payer: Self-pay | Admitting: Interventional Cardiology

## 2022-02-04 ENCOUNTER — Other Ambulatory Visit (HOSPITAL_COMMUNITY): Payer: Medicare Other

## 2022-02-04 DIAGNOSIS — R300 Dysuria: Secondary | ICD-10-CM | POA: Diagnosis not present

## 2022-02-12 ENCOUNTER — Other Ambulatory Visit: Payer: Self-pay | Admitting: Interventional Cardiology

## 2022-02-12 ENCOUNTER — Other Ambulatory Visit (HOSPITAL_COMMUNITY): Payer: Self-pay

## 2022-02-12 MED ORDER — ROSUVASTATIN CALCIUM 5 MG PO TABS
5.0000 mg | ORAL_TABLET | Freq: Every day | ORAL | 3 refills | Status: DC
  Filled 2022-02-12: qty 90, 90d supply, fill #0
  Filled 2022-05-14: qty 90, 90d supply, fill #1
  Filled 2022-08-12: qty 90, 90d supply, fill #2
  Filled 2022-11-13: qty 90, 90d supply, fill #3

## 2022-02-26 ENCOUNTER — Other Ambulatory Visit (HOSPITAL_COMMUNITY): Payer: Self-pay

## 2022-02-28 ENCOUNTER — Other Ambulatory Visit (HOSPITAL_COMMUNITY): Payer: Self-pay

## 2022-03-04 DIAGNOSIS — K4090 Unilateral inguinal hernia, without obstruction or gangrene, not specified as recurrent: Secondary | ICD-10-CM | POA: Diagnosis not present

## 2022-03-04 NOTE — Progress Notes (Signed)
?Cardiology Office Note:   ? ?Date:  03/05/2022  ? ?ID:  Isaac [name], DOB 02-09-1951, MRN [id number] ? ?PCP:  Josetta Huddle, MD  ?Cardiologist:  Sinclair Grooms, MD  ? ?Referring MD: Josetta Huddle, MD  ? ?Chief Complaint  ?Patient presents with  ? Congestive Heart Failure  ? Coronary Artery Disease  ? Follow-up  ?  PVCs  ? ? ?History of Present Illness:   ? ?Isaac [name] is a 71 y.o. male with a hx of CAD/s/p bypass 2010, paroxsymal afib and ablation 11/17 (failed tikosyn), atretic LAD and non-ischemic nuclear study 2017, chronic combined systolic and diastolic HF(echo EF 00-93% 2019), and chronic Eliquis anticoagulation. ? ?Heart failure therapy includes:Farxiga; metoprolol; Losartan. ? ?Saw Dr. Lovena Le for ~10% PVC burden. ? ?Isaac [name] is doing well.  No obvious side effects to [place] other than some lightheadedness.  He has seen Dr. Lovena Le because of my concern about PVC burden contributing to decreased LV function.  After starting Farxiga, the patient noted that PVCs completely disappeared.  They have not returned. ? ?He does not notice any clinical improvements on Farxiga but certainly no side effects.  We discussed quadruple therapy for reduced LV function including conversation concerning the benefits of SGLT2, ARNI therapy, MRA therapy, and appropriate beta-blocker use.  We made the decision to continue Farxiga, ARB therapy, and change metoprolol titrate to metoprolol succinate 25 mg/day. ? ?Past Medical History:  ?Diagnosis Date  ? CERVICAL SPINE DISORDER, NOS   ? Chronic systolic (congestive) heart failure (HCC)   ? EF 40%  ? Coronary artery disease   ? a. s/p CABG  ? 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 METATARSALGIA   ? OSTEOARTHROSIS, LOCAL, SCND, HAND   ? Paroxysmal atrial fibrillation (HCC)   ? ? ?Past Surgical History:  ?Procedure Laterality Date  ? APPENDECTOMY    ? CHOLECYSTECTOMY    ? CORONARY ARTERY BYPASS GRAFT  10/2009  ? ELECTROPHYSIOLOGIC STUDY N/A 09/17/2016  ? Procedure: Atrial  Fibrillation Ablation;  Surgeon: Thompson Grayer, MD;  Location: [facility];  Service: Cardiovascular;  Laterality: N/A;  ? ? ?Current Medications: ?Current Meds  ?Medication Sig  ? acetaminophen (TYLENOL) 325 MG tablet Take 325 mg by mouth every 6 (six) hours as needed for mild pain.  ? apixaban (ELIQUIS) 5 MG TABS tablet Take 1 tablet (5 mg total) by mouth 2 (two) times daily.  ? cetirizine (ZYRTEC) 10 MG tablet Take 10 mg by mouth daily.  ? cholecalciferol (VITAMIN D) 400 units TABS tablet Take 3,000 Units by mouth daily.   ? COVID-19 mRNA bivalent vaccine, Pfizer, (PFIZER COVID-19 VAC BIVALENT) injection Inject into the muscle.  ? COVID-19 mRNA Vac-TriS, Pfizer, (PFIZER-BIONT COVID-19 VAC-TRIS) SUSP injection Inject into the muscle.  ? dapagliflozin propanediol (FARXIGA) 10 MG TABS tablet Take 1 tablet (10 mg total) by mouth daily before breakfast.  ? influenza vaccine adjuvanted (FLUAD) 0.5 ML injection Inject into the muscle.  ? losartan (COZAAR) 25 MG tablet Take 1 tablet (25 mg total) by mouth daily.  ? metoprolol succinate (TOPROL XL) 25 MG 24 hr tablet Take 1 tablet (25 mg total) by mouth daily.  ? rosuvastatin (CRESTOR) 5 MG tablet Take 1 tablet (5 mg total) by mouth daily.  ? tamsulosin (FLOMAX) 0.4 MG CAPS capsule TAKE 1 CAPSULE BY MOUTH ONCE DAILY 30 MINUTES AFTER THE SAME MEAL.  ? [DISCONTINUED] metoprolol tartrate (LOPRESSOR) 25 MG tablet Take 1/2 tablet (12.5 mg total) by mouth 2 (two)  times daily.  ?  ? ?Allergies:   Patient has no known allergies.  ? ?Social History  ? ?Socioeconomic History  ? Marital status: Married  ?  Spouse name: Not on file  ? Number of children: Not on file  ? Years of education: Not on file  ? Highest education level: Not on file  ?Occupational History  ? Not on file  ?Tobacco Use  ? Smoking status: Passive Smoke Exposure - Never Smoker  ? Smokeless tobacco: Never  ? Tobacco comments:  ?  both parents smoked  ?Vaping Use  ? Vaping Use: Never used  ?Substance and  Sexual Activity  ? Alcohol use: Yes  ?  Comment: 3  ? Drug use: No  ? Sexual activity: Not Currently  ?Other Topics Concern  ? Not on file  ?Social History Narrative  ? Not on file  ? ?Social Determinants of Health  ? ?Financial Resource Strain: Not on file  ?Food Insecurity: Not on file  ?Transportation Needs: Not on file  ?Physical Activity: Not on file  ?Stress: Not on file  ?Social Connections: Not on file  ?  ? ?Family History: ?The patient's family history includes Emphysema in his father and mother. ? ?ROS:   ?Please see the history of present illness.    ?Has questions about the benefit of [place].  We discussed in detail.  All other systems reviewed and are negative. ? ?EKGs/Labs/Other Studies Reviewed:   ? ?The following studies were reviewed today: ?Continuous Monitor  12/07/2021 ?Study Highlights ? ?  ?Basic rhythm is NSR ?PVC's account for symptoms ?PVC burden ~ 10% ?4 non-sustained SVT salvos, longest 5 beats with longest ?  ?  ?Patch Wear Time:  6 days and 23 hours (2023-01-11T20:51:14-0500 to 2023-01-18T20:45:26-0500) ?  ?Patient had a min HR of 47 bpm, max HR of 187 bpm, and avg HR of 63 bpm. Predominant underlying rhythm was Sinus Rhythm. 4 Supraventricular Tachycardia runs occurred, the run with the fastest interval lasting 4 beats with a max rate of 187 bpm, the  ?longest lasting 5 beats with an avg rate of 116 bpm. Isolated SVEs were rare (<1.0%), SVE Couplets were rare (<1.0%), and SVE Triplets were rare (<1.0%). Isolated VEs were frequent (8.5%, 54834), VE Couplets were occasional (1.2%, 3927), and VE Triplets  ?were rare (<1.0%, 113). Ventricular Trigeminy was present.  ? ? ?2D Doppler Echocardiogram 11/29/2021: ?IMPRESSIONS  ? 1. Left ventricular ejection fraction, by estimation, is 40 to 45%. The  ?left ventricle has mildly decreased function. The left ventricle  ?demonstrates global hypokinesis. There is mild concentric left ventricular  ?hypertrophy. Left ventricular diastolic  ?parameters  are consistent with Grade I diastolic dysfunction (impaired  ?relaxation).  ? 2. Right ventricular systolic function is normal. The right ventricular  ?size is normal.  ? 3. The mitral valve is normal in structure. Trivial mitral valve  ?regurgitation. No evidence of mitral stenosis.  ? 4. Tricuspid valve regurgitation is mild to moderate.  ? 5. The aortic valve is normal in structure. Aortic valve regurgitation is  ?not visualized. Aortic valve sclerosis is present, with no evidence of  ?aortic valve stenosis.  ? 6. The inferior vena cava is normal in size with greater than 50%  ?respiratory variability, suggesting right atrial pressure of 3 mmHg.  ? ?Comparison(s): A prior study was performed on 11/27/2017. No significant  ?change from prior study.  ? ?EKG:  EKG not repeated ? ?Recent Labs: ?12/21/2021: BUN 19; Creatinine, Ser 0.90; Potassium 4.1; Sodium  142  ?Recent Lipid Panel ?   ?Component Value Date/Time  ? CHOL 125 12/31/2017 1120  ? TRIG 86 12/31/2017 1120  ? HDL 48 12/31/2017 1120  ? CHOLHDL 2.6 12/31/2017 1120  ? CHOLHDL 2.9 05/22/2012 0221  ? VLDL 21 05/22/2012 0221  ? [place] 60 12/31/2017 1120  ? ? ?Physical Exam:   ? ?VS:  BP (!) 112/58   Pulse (!) 58   Ht '5\' 10"'$  (1.778 m)   Wt 149 lb 9.6 oz (67.9 kg)   SpO2 98%   BMI 21.47 kg/m?    ? ?Wt Readings from Last 3 Encounters:  ?03/05/22 149 lb 9.6 oz (67.9 kg)  ?01/14/22 155 lb (70.3 kg)  ?11/19/21 152 lb 12.8 oz (69.3 kg)  ?  ? ?GEN: Slender and healthy appearing. No acute distress ?HEENT: Normal ?NECK: No JVD. ?LYMPHATICS: No lymphadenopathy ?CARDIAC: No murmur. RRR S4 but no S3 gallop, or edema. ?VASCULAR:  Normal Pulses. No bruits. ?RESPIRATORY:  Clear to auscultation without rales, wheezing or rhonchi  ?ABDOMEN: Soft, non-tender, non-distended, No pulsatile mass, ?MUSCULOSKELETAL: No deformity  ?SKIN: Warm and dry ?NEUROLOGIC:  Alert and oriented x 3 ?PSYCHIATRIC:  Normal affect  ? ?ASSESSMENT:   ? ?1. Coronary artery disease involving coronary  bypass graft of native heart with angina pectoris ([place])   ?2. Chronic combined systolic and diastolic CHF, NYHA class 2 ([place])   ?3. PVC's (premature ventricular contractions)   ?4. Paroxysmal atrial fibrillation (HCC)

## 2022-03-05 ENCOUNTER — Other Ambulatory Visit (HOSPITAL_COMMUNITY): Payer: Self-pay

## 2022-03-05 ENCOUNTER — Encounter: Payer: Self-pay | Admitting: Interventional Cardiology

## 2022-03-05 ENCOUNTER — Ambulatory Visit: Payer: Medicare Other | Admitting: Interventional Cardiology

## 2022-03-05 VITALS — BP 112/58 | HR 58 | Ht 70.0 in | Wt 149.6 lb

## 2022-03-05 DIAGNOSIS — Z7901 Long term (current) use of anticoagulants: Secondary | ICD-10-CM

## 2022-03-05 DIAGNOSIS — I493 Ventricular premature depolarization: Secondary | ICD-10-CM | POA: Diagnosis not present

## 2022-03-05 DIAGNOSIS — E785 Hyperlipidemia, unspecified: Secondary | ICD-10-CM | POA: Diagnosis not present

## 2022-03-05 DIAGNOSIS — I25709 Atherosclerosis of coronary artery bypass graft(s), unspecified, with unspecified angina pectoris: Secondary | ICD-10-CM

## 2022-03-05 DIAGNOSIS — I5042 Chronic combined systolic (congestive) and diastolic (congestive) heart failure: Secondary | ICD-10-CM | POA: Diagnosis not present

## 2022-03-05 DIAGNOSIS — I480 Paroxysmal atrial fibrillation: Secondary | ICD-10-CM | POA: Diagnosis not present

## 2022-03-05 MED ORDER — METOPROLOL SUCCINATE ER 25 MG PO TB24
25.0000 mg | ORAL_TABLET | Freq: Every day | ORAL | 3 refills | Status: DC
  Filled 2022-03-05: qty 90, 90d supply, fill #0
  Filled 2022-05-28: qty 90, 90d supply, fill #1
  Filled 2022-08-26: qty 90, 90d supply, fill #2
  Filled 2022-11-21: qty 90, 90d supply, fill #3

## 2022-03-05 MED ORDER — DAPAGLIFLOZIN PROPANEDIOL 10 MG PO TABS
10.0000 mg | ORAL_TABLET | Freq: Every day | ORAL | 2 refills | Status: DC
  Filled 2022-03-05 – 2022-03-27 (×2): qty 90, 90d supply, fill #0
  Filled 2022-06-26: qty 90, 90d supply, fill #1
  Filled 2022-09-26: qty 30, 30d supply, fill #2
  Filled 2022-10-22: qty 30, 30d supply, fill #3
  Filled 2022-11-21: qty 30, 30d supply, fill #4

## 2022-03-05 NOTE — Patient Instructions (Signed)
Medication Instructions:  ?Your physician has recommended you make the following change in your medication:  ?STOP: metoprolol tartrate 12.5 mg  ?START: metoprolol succinate 25 mg by mouth once daily ? ?*If you need a refill on your cardiac medications before your next appointment, please call your pharmacy* ? ? ?Lab Work: ?NONE ?If you have labs (blood work) drawn today and your tests are completely normal, you will receive your results only by: ?MyChart Message (if you have MyChart) OR ?A paper copy in the mail ?If you have any lab test that is abnormal or we need to change your treatment, we will call you to review the results. ? ? ?Testing/Procedures: ?NONE ? ? ?Follow-Up: ?At [facility], you and your health needs are our priority.  As part of our continuing mission to provide you with exceptional heart care, we have created designated Provider Care Teams.  These Care Teams include your primary Cardiologist (physician) and Advanced Practice Providers (APPs -  Physician Assistants and Nurse Practitioners) who all work together to provide you with the care you need, when you need it. ?  ? ?Your next appointment:   ?1 year(s) ? ?The format for your next appointment:   ?In Person ? ?Provider:   ?Sinclair Grooms, MD   ? ?Important Information About [place] ? ? ? ? ?  ?

## 2022-03-12 ENCOUNTER — Ambulatory Visit: Payer: Self-pay | Admitting: Surgery

## 2022-03-12 ENCOUNTER — Other Ambulatory Visit (HOSPITAL_COMMUNITY): Payer: Self-pay

## 2022-03-12 ENCOUNTER — Telehealth: Payer: Self-pay | Admitting: *Deleted

## 2022-03-12 DIAGNOSIS — K4020 Bilateral inguinal hernia, without obstruction or gangrene, not specified as recurrent: Secondary | ICD-10-CM | POA: Diagnosis not present

## 2022-03-12 NOTE — H&P (Signed)
? ?Subjective  ? ?Chief Complaint: New Patient (New patient  unilateral inguinal hernia ) ?  ? ? ?History of Present Illness: ?Isaac [name] is a 71 y.o. male who is seen today as an office consultation at the request of Dr. Inda Merlin for evaluation of New Patient (New patient  unilateral inguinal hernia ) ?Marland Kitchen   ?This is a 71 year old male with some cardiac issues requiring anticoagulation on Eliquis who presents with a 6-week history of left groin discomfort.  He denies any swelling in this area.  He did initially feel some urinary frequency which has improved.  He does notice a slight change in his bowel habits becoming more constipated.  The patient is fairly active and does a lot of yard work.  He was evaluated by the PA at his primary care physician who thought that he might have an inguinal hernia.  He is referred to [place] for surgical evaluation. ? ?Patient had a previous open cholecystectomy as well as a laparoscopic appendectomy by Dr. Fanny Skates. ? ? ? ?Review of Systems: ?A complete review of systems was obtained from the patient.  I have reviewed this information and discussed as appropriate with the patient.  See HPI as well for other ROS. ? ?Review of Systems  ?Constitutional: Negative.   ?HENT: Negative.   ?Eyes: Negative.   ?Respiratory: Negative.   ?Cardiovascular: Negative.   ?Gastrointestinal: Positive for constipation.  ?Genitourinary: Negative.   ?Musculoskeletal: Negative.   ?Skin: Negative.   ?Neurological: Negative.   ?Endo/Heme/Allergies: Negative.   ?Psychiatric/Behavioral: Negative.   ?  ? ? ?Medical History: ?Past Medical History:  ?Diagnosis Date  ? Arrhythmia   ? CHF (congestive heart failure) (CMS-HCC)   ? Hypertension   ? ? ?Patient Active Problem List  ?Diagnosis  ? Acquired thrombophilia (CMS-HCC)  ? 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 Atrial fibrillation (CMS-HCC)  ? 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 Diverticulitis of colon (without mention of hemorrhage)(562.11)  ? MI (myocardial infarction) (CMS-HCC)  ? S/P CABG (coronary artery bypass graft)  ? Non-recurrent bilateral inguinal hernia without obstruction or gangrene  ? ? ?Past Surgical History:  ?Procedure Laterality Date  ? APPENDECTOMY    ? CHOLECYSTECTOMY    ?  ? ?No Known Allergies ? ?Current Outpatient Medications on File Prior to Visit  ?Medication Sig Dispense Refill  ? dapagliflozin (FARXIGA) 10 mg tablet Take by mouth    ? ELIQUIS 5 mg tablet     ? metoprolol tartrate (LOPRESSOR) 25 MG tablet 1/2 tablet    ? rosuvastatin (CRESTOR) 5 MG tablet Take 5 mg by mouth once daily    ? tamsulosin (FLOMAX) 0.4 mg capsule TAKE 1 CAPSULE BY MOUTH ONCE DAILY 30 MINUTES AFTER THE SAME MEAL.    ? ?No current facility-administered medications on file prior to visit.  ? ? ?Family History  ?Problem Relation Age of Onset  ? High blood pressure (Hypertension) Father   ?  ? ?Social History  ? ?Tobacco Use  ?Smoking Status Never  ?Smokeless Tobacco Never  ?  ? ?Social History  ? ?Socioeconomic History  ? Marital status: Married  ?Tobacco Use  ? Smoking status: Never  ? Smokeless tobacco: Never  ?Substance and Sexual Activity  ? Alcohol use: Yes  ?  Comment: 1 drink /week  ? Drug use: Never  ? ? ?Objective:  ? ? ?Vitals:  ? 03/12/22 1416  ?BP: 114/68  ?  Pulse: 66  ?Temp: 36.8 ?C (98.2 ?F)  ?SpO2: 98%  ?Weight: 67.9 kg (149 lb 9.6 oz)  ?Height: 177.8 cm ('5\' 10"'$ )  ?  ?Body mass index is 21.47 kg/m?. ? ?Physical Exam  ? ?Constitutional:  WDWN in NAD, conversant, no obvious deformities; lying in bed comfortably ?Eyes:  Pupils equal, round; sclera anicteric; moist conjunctiva; no lid lag ?HENT:  Oral mucosa moist; good dentition  ?Neck:  No masses palpated, trachea midline; no thyromegaly ?Lungs:  CTA bilaterally; normal respiratory effort ?CV:  Regular rate and rhythm; no murmurs; extremities well-perfused with no edema ?Abd:   +bowel sounds, soft, non-tender, no palpable organomegaly; healed right subcostal incision.  Small umbilical hernia easily reducible ?GU: Bilateral's in the testes, no testicular masses, small reducible bilateral inguinal hernias when standing with Valsalva maneuver. ?Musc: Normal gait; no apparent clubbing or cyanosis in extremities ?Lymphatic:  No palpable cervical or axillary lymphadenopathy ?Skin:  Warm, dry; no sign of jaundice ?Psychiatric - alert and oriented x 4; calm mood and affect ? ? ? ?Assessment and Plan:  ?Diagnoses and all orders for this visit: ? ?Non-recurrent bilateral inguinal hernia without obstruction or gangrene ? ?  ?Open bilateral inguinal hernia repairs with mesh.The surgical procedure has been discussed with the patient.  Potential risks, benefits, alternative treatments, and expected outcomes have been explained.  All of the patient's questions at this time have been answered.  The likelihood of reaching the patient's treatment goal is good.  The patient understand the proposed surgical procedure and wishes to proceed. ? ? ?No follow-ups on file. ? ?Gimena Buick Jearld Adjutant, MD  ?03/12/2022 ?2:48 PM ? ?

## 2022-03-12 NOTE — H&P (View-Only) (Signed)
Subjective   Chief Complaint: New Patient (New patient  unilateral inguinal hernia )     History of Present Illness: Isaac [name] is a 71 y.o. male who is seen today as an office consultation at the request of Dr. Inda Merlin for evaluation of New Patient (New patient  unilateral inguinal hernia ) .   This is a 71 year old male with some cardiac issues requiring anticoagulation on Eliquis who presents with a 6-week history of left groin discomfort.  He denies any swelling in this area.  He did initially feel some urinary frequency which has improved.  He does notice a slight change in his bowel habits becoming more constipated.  The patient is fairly active and does a lot of yard work.  He was evaluated by the PA at his primary care physician who thought that he might have an inguinal hernia.  He is referred to [place] for surgical evaluation.  Patient had a previous open cholecystectomy as well as a laparoscopic appendectomy by Dr. Fanny Skates.    Review of Systems: A complete review of systems was obtained from the patient.  I have reviewed this information and discussed as appropriate with the patient.  See HPI as well for other ROS.  Review of Systems  Constitutional: Negative.   HENT: Negative.   Eyes: Negative.   Respiratory: Negative.   Cardiovascular: Negative.   Gastrointestinal: Positive for constipation.  Genitourinary: Negative.   Musculoskeletal: Negative.   Skin: Negative.   Neurological: Negative.   Endo/Heme/Allergies: Negative.   Psychiatric/Behavioral: Negative.       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)   Diverticulitis of colon (without mention of hemorrhage)(562.11)   MI (myocardial infarction) (CMS-HCC)   S/P CABG (coronary artery bypass graft)   Non-recurrent bilateral inguinal hernia without obstruction or gangrene    Past Surgical History:  Procedure Laterality Date   APPENDECTOMY     CHOLECYSTECTOMY       No Known Allergies  Current Outpatient Medications on File Prior to Visit  Medication Sig Dispense Refill   dapagliflozin (FARXIGA) 10 mg tablet Take by mouth     ELIQUIS 5 mg tablet      metoprolol tartrate (LOPRESSOR) 25 MG tablet 1/2 tablet     rosuvastatin (CRESTOR) 5 MG tablet Take 5 mg by mouth once daily     tamsulosin (FLOMAX) 0.4 mg capsule TAKE 1 CAPSULE BY MOUTH ONCE DAILY 30 MINUTES AFTER THE SAME MEAL.     No current facility-administered medications on file prior to visit.    Family History  Problem Relation Age of Onset   High blood pressure (Hypertension) Father      Social History   Tobacco Use  Smoking Status Never  Smokeless Tobacco Never     Social History   Socioeconomic History   Marital status: Married  Tobacco Use   Smoking status: Never   Smokeless tobacco: Never  Substance and Sexual Activity   Alcohol use: Yes    Comment: 1 drink /week   Drug use: Never    Objective:    Vitals:   03/12/22 1416  BP: 114/68  Pulse: 66  Temp: 36.8 C (98.2 F)  SpO2: 98%  Weight: 67.9 kg (149 lb 9.6 oz)  Height: 177.8 cm ('5\' 10"'$ )    Body mass index is 21.47 kg/m.  Physical Exam   Constitutional:  WDWN in NAD, conversant, no obvious deformities; lying in bed comfortably Eyes:  Pupils equal, round; sclera anicteric; moist conjunctiva; no lid lag HENT:  Oral mucosa moist; good dentition  Neck:  No masses palpated, trachea midline; no thyromegaly Lungs:  CTA bilaterally; normal respiratory effort CV:  Regular rate and rhythm; no murmurs; extremities well-perfused with no edema Abd:   +bowel sounds, soft, non-tender, no palpable organomegaly; healed right subcostal incision.  Small umbilical hernia easily reducible GU: Bilateral's in the testes, no testicular masses, small reducible bilateral inguinal hernias when standing with Valsalva maneuver. Musc: Normal gait; no apparent clubbing or cyanosis in extremities Lymphatic:  No palpable cervical or axillary lymphadenopathy Skin:  Warm, dry; no sign of jaundice Psychiatric - alert and oriented x 4; calm mood and affect    Assessment and Plan:  Diagnoses and all orders for this visit:  Non-recurrent bilateral inguinal hernia without obstruction or gangrene    Open bilateral inguinal hernia repairs with mesh.The surgical procedure has been discussed with the patient.  Potential risks, benefits, alternative treatments, and expected outcomes have been explained.  All of the patient's questions at this time have been answered.  The likelihood of reaching the patient's treatment goal is good.  The patient understand the proposed surgical procedure and wishes to proceed.   No follow-ups on file.  Iverna Hammac Jearld Adjutant, MD  03/12/2022 2:48 PM

## 2022-03-12 NOTE — Telephone Encounter (Signed)
We will route to pharmacy for input on [place].  Patient is scheduled for a laparoscopic hernia repair.  Procedure currently not scheduled as of yet.  Had recent office visit with Dr. Tamala Julian on 03/05/2022. ?

## 2022-03-12 NOTE — Telephone Encounter (Signed)
? ?  Pre-operative Risk Assessment  ?  ?Patient Name: Isaac [name]  ?DOB: 29-Mar-1951 ?MRN: [id number]  ? ?  ? ?Request for Surgical Clearance   ? ?Procedure:   LAPAROSCOPIC HERNIA REPAIR ? ?Date of Surgery:  Clearance TBD                              ?   ?Surgeon:  Donnie Mesa, MD ?Surgeon's Group or Practice Name:   [facility] ?Phone number:   [phone] ?Fax number:   [phone] ?  ?Type of Clearance Requested:   ?- Medical  ?- Pharmacy:  Hold Apixaban (Eliquis) NOT INDICATED ?  ?Type of Anesthesia:  General  ?  ?Additional requests/questions:   ? ?Signed, ?Jeanann Lewandowsky   ?03/12/2022, 2:49 PM  ? ?

## 2022-03-13 NOTE — Telephone Encounter (Signed)
? ?  Patient Name: Isaac [name]  ?DOB: Dec 30, 1950 ?MRN: [id number] ? ?Primary Cardiologist: Sinclair Grooms, MD ? ?Chart reviewed as part of pre-operative protocol coverage. Patient was just recently seen by Dr. Tamala Julian on 03/05/22 for evaluation though patient did not yet have surgery mentioned in that visit. At baseline revised cardiac risk index is 6.6% based on history. I reached out to patient to ensure no new changes from recent visit. The patient affirms he has been doing well without any new cardiac symptoms. They are able to achieve over 4 METS without cardiac limitations. He is able to exercise multiple times a week on an exercise bike for 45 minutes without any angina or dyspnea. Therefore, based on ACC/AHA guidelines, the patient would be at acceptable risk for the planned procedure without further cardiovascular testing. The patient was advised that if he develops new symptoms prior to surgery to contact our office to arrange for a follow-up visit, and he verbalized understanding. ? ?Regarding anticoagulation, pharmacist reviewed surgical request and states per office protocol, patient can hold Eliquis for 2 days prior to procedure. The patient remarks that he thinks he was told it may need to be 3 days. Our pharmacist is recommending 2 days at this time. We will defer to surgeon to review this recommendation and relay final instructions to the patient. If a 3 day hold is required, please reach back out for review. ? ?Will route this bundled recommendation to requesting provider via Epic fax function. Please call with questions. ? ?Charlie Pitter, PA-C ?03/13/2022, 9:23 AM ? ? ?

## 2022-03-13 NOTE — Telephone Encounter (Signed)
Patient with diagnosis of afib on Eliquis for anticoagulation.   ? ?Procedure: laparoscopic hernia repair ?Date of procedure: TBD ? ?CHA2DS2-VASc Score = 3  ?This indicates a 3.2% annual risk of stroke. ?The patient's score is based upon: ?CHF History: 1 ?HTN History: 0 ?Diabetes History: 0 ?Stroke History: 0 ?Vascular Disease History: 1 ?Age Score: 1 ?Gender Score: 0 ? ?CrCl 48m/min ?Platelet count 186K ? ?Per office protocol, patient can hold Eliquis for 2 days prior to procedure.   ?

## 2022-03-19 ENCOUNTER — Other Ambulatory Visit (HOSPITAL_COMMUNITY): Payer: Self-pay

## 2022-03-27 ENCOUNTER — Other Ambulatory Visit (HOSPITAL_COMMUNITY): Payer: Self-pay

## 2022-03-29 NOTE — Progress Notes (Signed)
Surgical Instructions    Your procedure is scheduled on 04/09/22.  Report to [facility] Main Entrance "A" at 8:15 A.M., then check in with the Admitting office.  Call this number if you have problems the morning of surgery:  [phone]   If you have any questions prior to your surgery date call [phone]: Open Monday-Friday 8am-4pm    Remember:  Do not eat after midnight the night before your surgery  You may drink clear liquids until 7:15am the morning of your surgery.   Clear liquids allowed are: Water, Non-Citrus Juices (without pulp), Carbonated Beverages, Clear Tea, Black Coffee ONLY (NO MILK, CREAM OR POWDERED CREAMER of any kind), and Gatorade    Take these medicines the morning of surgery with A SIP OF WATER:  cetirizine (ZYRTEC) metoprolol succinate (TOPROL XL)  rosuvastatin (CRESTOR) tamsulosin (FLOMAX)  IF NEEDED: acetaminophen (TYLENOL)    As of today, STOP taking any Aspirin (unless otherwise instructed by your surgeon) Aleve, Naproxen, Ibuprofen, Motrin, Advil, Goody's, BC's, all herbal medications, fish oil, and all vitamins.  HOLD ELIQUIS 2 DAYS PRIOR TO PROCEDURE. YOUR LAST DOSE WILL BE 04/06/22.  WHAT DO I DO ABOUT MY DIABETES MEDICATION?   Do not take oral diabetes medicines (pills) the morning of surgery.  THE DAY BEFORE SURGERY, do not take dapagliflozin propanediol (FARXIGA).      THE MORNING OF SURGERY, do not take dapagliflozin propanediol (FARXIGA).  The day of surgery, do not take other diabetes injectables, including Byetta (exenatide), Bydureon (exenatide ER), Victoza (liraglutide), or Trulicity (dulaglutide).  If your CBG is greater than 220 mg/dL, you may take  of your sliding scale (correction) dose of insulin.   HOW TO MANAGE YOUR DIABETES BEFORE AND AFTER SURGERY  Why is it important to control my blood sugar before and after surgery? Improving blood sugar levels before and after surgery helps healing and can limit problems. A  way of improving blood sugar control is eating a healthy diet by:  Eating less sugar and carbohydrates  Increasing activity/exercise  Talking with your doctor about reaching your blood sugar goals High blood sugars (greater than 180 mg/dL) can raise your risk of infections and slow your recovery, so you will need to focus on controlling your diabetes during the weeks before surgery. Make sure that the doctor who takes care of your diabetes knows about your planned surgery including the date and location.  How do I manage my blood sugar before surgery? Check your blood sugar at least 4 times a day, starting 2 days before surgery, to make sure that the level is not too high or low.  Check your blood sugar the morning of your surgery when you wake up and every 2 hours until you get to the Short Stay unit.  If your blood sugar is less than 70 mg/dL, you will need to treat for low blood sugar: Do not take insulin. Treat a low blood sugar (less than 70 mg/dL) with  cup of clear juice (cranberry or apple), 4 glucose tablets, OR glucose gel. Recheck blood sugar in 15 minutes after treatment (to make sure it is greater than 70 mg/dL). If your blood sugar is not greater than 70 mg/dL on recheck, call [phone] for further instructions. Report your blood sugar to the short stay nurse when you get to Short Stay.  If you are admitted to the hospital after surgery: Your blood sugar will be checked by the staff and you will probably be given insulin after surgery (instead of  oral diabetes medicines) to make sure you have good blood sugar levels. The goal for blood sugar control after surgery is 80-180 mg/dL.      Do not wear jewelry or makeup Do not wear lotions, powders, perfumes/colognes, or deodorant. Men may shave face and neck. Do not bring valuables to the hospital. Do not wear nail polish, gel polish, artificial nails, or any other type of covering on natural nails (fingers and toes) If you  have artificial nails or gel coating that need to be removed by a nail salon, please have this removed prior to surgery. Artificial nails or gel coating may interfere with anesthesia's ability to adequately monitor your vital signs.  [facility] is not responsible for any belongings or valuables. .   Do NOT Smoke (Tobacco/Vaping)  24 hours prior to your procedure  If you use a CPAP at night, you may bring your mask for your overnight stay.   Contacts, glasses, hearing aids, dentures or partials may not be worn into surgery, please bring cases for these belongings   For patients admitted to the hospital, discharge time will be determined by your treatment team.   Patients discharged the day of surgery will not be allowed to drive home, and someone needs to stay with them for 24 hours.   SURGICAL WAITING ROOM VISITATION Patients having surgery or a procedure in a hospital may have two support people. Children under the age of 62 must have an adult with them who is not the patient. They may stay in the waiting area during the procedure and may switch out with other visitors. If the patient needs to stay at the hospital during part of their recovery, the visitor guidelines for inpatient rooms apply.  Please refer to the [facility] website for the visitor guidelines for Inpatients (after your surgery is over and you are in a regular room).       Special instructions:    Oral Hygiene is also important to reduce your risk of infection.  Remember - BRUSH YOUR TEETH THE MORNING OF SURGERY WITH YOUR REGULAR TOOTHPASTE   [facility]- Preparing For Surgery  Before surgery, you can play an important role. Because skin is not sterile, your skin needs to be as free of germs as possible. You can reduce the number of germs on your skin by washing with CHG (chlorahexidine gluconate) Soap before surgery.  CHG is an antiseptic cleaner which kills germs and bonds with the skin to continue killing germs  even after washing.     Please do not use if you have an allergy to CHG or antibacterial soaps. If your skin becomes reddened/irritated stop using the CHG.  Do not shave (including legs and underarms) for at least 48 hours prior to first CHG shower. It is OK to shave your face.  Please follow these instructions carefully.     Shower the NIGHT BEFORE SURGERY and the MORNING OF SURGERY with CHG Soap.   If you chose to wash your hair, wash your hair first as usual with your normal shampoo. After you shampoo, rinse your hair and body thoroughly to remove the shampoo.  Then [employer] and genitals (private parts) with your normal soap and rinse thoroughly to remove soap.  After that Use CHG Soap as you would any other liquid soap. You can apply CHG directly to the skin and wash gently with a scrungie or a clean washcloth.   Apply the CHG Soap to your body ONLY FROM THE NECK  DOWN.  Do not use on open wounds or open sores. Avoid contact with your eyes, ears, mouth and genitals (private parts). Wash Face and genitals (private parts)  with your normal soap.   Wash thoroughly, paying special attention to the area where your surgery will be performed.  Thoroughly rinse your body with warm water from the neck down.  DO NOT shower/wash with your normal soap after using and rinsing off the CHG Soap.  Pat yourself dry with a CLEAN TOWEL.  Wear CLEAN PAJAMAS to bed the night before surgery  Place CLEAN SHEETS on your bed the night before your surgery  DO NOT SLEEP WITH PETS.   Day of Surgery: Take a shower with CHG soap. Wear Clean/Comfortable clothing the morning of surgery Do not apply any deodorants/lotions.   Remember to brush your teeth WITH YOUR REGULAR TOOTHPASTE.    If you received a COVID test during your pre-op visit, it is requested that you wear a mask when out in public, stay away from anyone that may not be feeling well, and notify your surgeon if you develop symptoms. If you have  been in contact with anyone that has tested positive in the last 10 days, please notify your surgeon.    Please read over the following fact sheets that you were given.

## 2022-04-01 ENCOUNTER — Encounter (HOSPITAL_COMMUNITY)
Admission: RE | Admit: 2022-04-01 | Discharge: 2022-04-01 | Disposition: A | Discharge status: Home / Self Care | Payer: Medicare Other | Source: Ambulatory Visit | Attending: Surgery | Admitting: Surgery

## 2022-04-01 ENCOUNTER — Other Ambulatory Visit: Payer: Self-pay

## 2022-04-01 ENCOUNTER — Other Ambulatory Visit: Payer: Self-pay | Admitting: Interventional Cardiology

## 2022-04-01 ENCOUNTER — Encounter (HOSPITAL_COMMUNITY): Payer: Self-pay

## 2022-04-01 VITALS — BP 108/68 | HR 55 | Temp 97.7°F | Resp 17 | Ht 70.0 in | Wt 148.7 lb

## 2022-04-01 DIAGNOSIS — I480 Paroxysmal atrial fibrillation: Secondary | ICD-10-CM | POA: Diagnosis not present

## 2022-04-01 DIAGNOSIS — Z01812 Encounter for preprocedural laboratory examination: Secondary | ICD-10-CM | POA: Insufficient documentation

## 2022-04-01 DIAGNOSIS — Z01818 Encounter for other preprocedural examination: Secondary | ICD-10-CM

## 2022-04-01 HISTORY — DX: Essential (primary) hypertension: I10

## 2022-04-01 LAB — BASIC METABOLIC PANEL
Anion gap: 5 (ref 5–15)
BUN: 16 mg/dL (ref 8–23)
CO2: 25 mmol/L (ref 22–32)
Calcium: 9.4 mg/dL (ref 8.9–10.3)
Chloride: 109 mmol/L (ref 98–111)
Creatinine, Ser: 1.01 mg/dL (ref 0.61–1.24)
GFR, Estimated: >60 mL/min (ref >60)
Glucose, Bld: 93 mg/dL (ref 70–99)
Potassium: 3.7 mmol/L (ref 3.5–5.1)
Sodium: 139 mmol/L (ref 135–145)

## 2022-04-01 LAB — CBC
HCT: 45.7 % (ref 39.0–52.0)
Hemoglobin: 15.5 g/dL (ref 13.0–17.0)
MCH: 31.7 pg (ref 26.0–34.0)
MCHC: 33.9 g/dL (ref 30.0–36.0)
MCV: 93.5 fL (ref 80.0–100.0)
Platelets: 178 10*3/uL (ref 150–400)
RBC: 4.89 MIL/uL (ref 4.22–5.81)
RDW: 11.9 % (ref 11.5–15.5)
WBC: 5.5 10*3/uL (ref 4.0–10.5)
nRBC: 0 % (ref 0.0–0.2)

## 2022-04-01 NOTE — Progress Notes (Signed)
PCP - Dr. Josetta Huddle Cardiologist - Dr. Daneen Schick III  PPM/ICD - denies   Chest x-ray - 05/21/12 EKG - 11/19/21 Stress Test - 10/24/16 ECHO - 11/29/21 Cardiac Cath - denies  Sleep Study - denies  DM- denies  Blood Thinner Instructions: Hold Eliquis 2 days prior. Last dose 04/06/22 Aspirin Instructions: n/a  ERAS Protcol - yes, no drink   COVID TEST- n/a   Anesthesia review: yes, cardiac hx  Patient denies shortness of breath, fever, cough and chest pain at PAT appointment   All instructions explained to the patient, with a verbal understanding of the material. Patient agrees to go over the instructions while at home for a better understanding. Patient also instructed to notify surgeon of any contact with COVID+ person or if he develops any symptoms The opportunity to ask questions was provided.

## 2022-04-02 ENCOUNTER — Other Ambulatory Visit (HOSPITAL_COMMUNITY): Payer: Self-pay

## 2022-04-02 MED ORDER — LOSARTAN POTASSIUM 25 MG PO TABS
25.0000 mg | ORAL_TABLET | Freq: Every day | ORAL | 3 refills | Status: DC
  Filled 2022-04-02: qty 90, 90d supply, fill #0
  Filled 2022-06-26: qty 90, 90d supply, fill #1
  Filled 2022-09-26: qty 90, 90d supply, fill #2
  Filled 2022-12-25: qty 90, 90d supply, fill #3

## 2022-04-02 NOTE — Anesthesia Preprocedure Evaluation (Deleted)
Anesthesia Evaluation    Airway        Dental   Pulmonary           Cardiovascular hypertension,      Neuro/Psych    GI/Hepatic   Endo/Other    Renal/GU      Musculoskeletal   Abdominal   Peds  Hematology   Anesthesia Other Findings   Reproductive/Obstetrics                             Anesthesia Physical Anesthesia Plan  ASA:   Anesthesia Plan:    Post-op Pain Management:    Induction:   PONV Risk Score and Plan:   Airway Management Planned:   Additional Equipment:   Intra-op Plan:   Post-operative Plan:   Informed Consent:   Plan Discussed with:   Anesthesia Plan Comments: (PAT note by Karoline Caldwell, PA-C: Follows with cardiology for history of CAD s/p bypass 2010, paroxysmal A-fib s/p ablation 2017 (maintained on Eliquis), chronic combined heart failure, nonischemic nuclear study 2017, .  Last seen by Dr. Tamala Julian 03/05/2022 and noted to be stable at that time.  No changes made to management.  1 year follow-up recommended.  Clearance per telephone encounter 03/13/22, "Chart reviewed as part of pre-operative protocol coverage.Patient was just recently seen by Dr. Tamala Julian on 03/05/22 for evaluation though patient did not yet have surgery mentioned in that visit. At baseline revised cardiac risk index is 6.6% based on history. I reached out to patient to ensure no new changes from recent visit.The patient affirmshehas been doing well without any new cardiac symptoms. They are able to achieve over 4METS without cardiac limitations. He is able to exercise multiple times a week on an exercise bike for 45 minutes without any angina or dyspnea.Therefore, based on ACC/AHA guidelines, the patient would be at acceptable risk for the planned procedure without further cardiovascular testing. The patient was advised that ifhedevelops new symptoms prior to surgery to contact our office to arrange for a  follow-up visit, and heverbalized understanding. Regarding anticoagulation, pharmacist reviewed surgical request and states per office protocol, patient can holdEliquisfor 2days prior to procedure. The patient remarks that he thinks he was told it may need to be 3 days. Our pharmacist is recommending 2 days at this time. We will defer to surgeon to review this recommendation and relay final instructions to the patient. If a 3 day hold is required, please reach back out for review."  Patient reported last dose Eliquis 04/06/2022.  Preop labs reviewed, WNL.  EKG 11/19/2021: Sinus bradycardia.  Rate 59.  PVCs.  Anteroseptal MI.  No sniffing changes.  TTE 11/29/2021: 1. Left ventricular ejection fraction, by estimation, is 40 to 45%. The  left ventricle has mildly decreased function. The left ventricle  demonstrates global hypokinesis. There is mild concentric left ventricular  hypertrophy. Left ventricular diastolic  parameters are consistent with Grade I diastolic dysfunction (impaired  relaxation).  2. Right ventricular systolic function is normal. The right ventricular  size is normal.  3. The mitral valve is normal in structure. Trivial mitral valve  regurgitation. No evidence of mitral stenosis.  4. Tricuspid valve regurgitation is mild to moderate.  5. The aortic valve is normal in structure. Aortic valve regurgitation is  not visualized. Aortic valve sclerosis is present, with no evidence of  aortic valve stenosis.  6. The inferior vena cava is normal in size with greater than 50%  respiratory variability,  suggesting right atrial pressure of 3 mmHg.   Comparison(s): A prior study was performed on 11/27/2017. No significant  change from prior study.   Nuclear stress 10/24/2016: . Nuclear stress EF: 40%. . Findings consistent with prior myocardial infarction. . This is a high risk study. . The left ventricular ejection fraction is moderately decreased (30-44%). Marland Kitchen Defect 1:  There is a defect present in the mid anterior, mid anteroseptal, mid anterolateral, apical anterior, apical septal and apex location. Fixed, consistent with scar . No significant change from previous scan )        Anesthesia Quick Evaluation

## 2022-04-02 NOTE — Progress Notes (Signed)
Anesthesia Chart Review:  Follows with cardiology for history of CAD s/p bypass 2010, paroxysmal A-fib s/p ablation 2017 (maintained on Eliquis), chronic combined heart failure, nonischemic nuclear study 2017, .  Last seen by Dr. Tamala Julian 03/05/2022 and noted to be stable at that time.  No changes made to management.  1 year follow-up recommended.  Clearance per telephone encounter 03/13/22, "Chart reviewed as part of pre-operative protocol coverage. Patient was just recently seen by Dr. Tamala Julian on 03/05/22 for evaluation though patient did not yet have surgery mentioned in that visit. At baseline revised cardiac risk index is 6.6% based on history. I reached out to patient to ensure no new changes from recent visit. The patient affirms he has been doing well without any new cardiac symptoms. They are able to achieve over 4 METS without cardiac limitations. He is able to exercise multiple times a week on an exercise bike for 45 minutes without any angina or dyspnea. Therefore, based on ACC/AHA guidelines, the patient would be at acceptable risk for the planned procedure without further cardiovascular testing. The patient was advised that if he develops new symptoms prior to surgery to contact our office to arrange for a follow-up visit, and he verbalized understanding. Regarding anticoagulation, pharmacist reviewed surgical request and states per office protocol, patient can hold Eliquis for 2 days prior to procedure. The patient remarks that he thinks he was told it may need to be 3 days. Our pharmacist is recommending 2 days at this time. We will defer to surgeon to review this recommendation and relay final instructions to the patient. If a 3 day hold is required, please reach back out for review."  Patient reported last dose Eliquis 04/06/2022.  Preop labs reviewed, WNL.  EKG 11/19/2021: Sinus bradycardia.  Rate 59.  PVCs.  Anteroseptal MI.  No sniffing changes.  TTE 11/29/2021:  1. Left ventricular ejection  fraction, by estimation, is 40 to 45%. The  left ventricle has mildly decreased function. The left ventricle  demonstrates global hypokinesis. There is mild concentric left ventricular  hypertrophy. Left ventricular diastolic  parameters are consistent with Grade I diastolic dysfunction (impaired  relaxation).   2. Right ventricular systolic function is normal. The right ventricular  size is normal.   3. The mitral valve is normal in structure. Trivial mitral valve  regurgitation. No evidence of mitral stenosis.   4. Tricuspid valve regurgitation is mild to moderate.   5. The aortic valve is normal in structure. Aortic valve regurgitation is  not visualized. Aortic valve sclerosis is present, with no evidence of  aortic valve stenosis.   6. The inferior vena cava is normal in size with greater than 50%  respiratory variability, suggesting right atrial pressure of 3 mmHg.   Comparison(s): A prior study was performed on 11/27/2017. No significant  change from prior study.   Nuclear stress 10/24/2016: Nuclear stress EF: 40%. Findings consistent with prior myocardial infarction. This is a high risk study. The left ventricular ejection fraction is moderately decreased (30-44%). Defect 1: There is a defect present in the mid anterior, mid anteroseptal, mid anterolateral, apical anterior, apical septal and apex location. Fixed, consistent with scar No significant change from previous scan     Wynonia Musty [facility] Short Stay Center/Anesthesiology Phone [phone] 04/02/2022 2:35 PM

## 2022-04-04 ENCOUNTER — Other Ambulatory Visit: Payer: Self-pay | Admitting: Gastroenterology

## 2022-04-08 NOTE — Anesthesia Preprocedure Evaluation (Signed)
Anesthesia Evaluation  Patient identified by MRN, date of birth, ID band Patient awake    Reviewed: Allergy & Precautions, NPO status , Patient's Chart, lab work & pertinent test results  History of Anesthesia Complications Negative for: history of anesthetic complications  Airway Mallampati: III  TM Distance: >3 FB Neck ROM: Full    Dental  (+) Teeth Intact, Dental Advisory Given   Pulmonary neg pulmonary ROS,    Pulmonary exam normal breath sounds clear to auscultation       Cardiovascular hypertension, Pt. on home beta blockers + CAD, + Past MI, + CABG (2010) and +CHF  Normal cardiovascular exam+ dysrhythmias (PVCs, Afib s/p ablation 2018) Atrial Fibrillation  Rhythm:Regular Rate:Normal  Echo 11/2021  - Left ventricle: Diffuse hypokinesis worse in the septum adn apex.   The cavity size was moderately dilated. Wall thickness was normal. Systolic function was moderately reduced. The estimated ejection fraction was in the range of 35% to 40%. Doppler parameters are consistent with abnormal left ventricular relaxation (grade 1 diastolic dysfunction).   Neuro/Psych negative neurological ROS  negative psych ROS   GI/Hepatic Neg liver ROS,   Endo/Other  negative endocrine ROS  Renal/GU negative Renal ROS  negative genitourinary   Musculoskeletal  (+) Arthritis , Osteoarthritis,    Abdominal   Peds  Hematology negative hematology ROS (+)   Anesthesia Other Findings   Reproductive/Obstetrics negative OB ROS                            Anesthesia Physical  Anesthesia Plan  ASA: 3  Anesthesia Plan: General   Post-op Pain Management: Regional block*, Celebrex PO (pre-op)* and Tylenol PO (pre-op)*   Induction: Intravenous  PONV Risk Score and Plan: 3 and Ondansetron, Dexamethasone and Diphenhydramine  Airway Management Planned: LMA  Additional Equipment: None  Intra-op Plan:    Post-operative Plan: Extubation in OR  Informed Consent: I have reviewed the patients History and Physical, chart, labs and discussed the procedure including the risks, benefits and alternatives for the proposed anesthesia with the patient or authorized representative who has indicated his/her understanding and acceptance.     Dental advisory given  Plan Discussed with: Anesthesiologist and CRNA  Anesthesia Plan Comments:        Anesthesia Quick Evaluation

## 2022-04-09 ENCOUNTER — Other Ambulatory Visit: Payer: Self-pay

## 2022-04-09 ENCOUNTER — Ambulatory Visit (HOSPITAL_COMMUNITY)
Admission: RE | Admit: 2022-04-09 | Discharge: 2022-04-09 | Disposition: A | Discharge status: Home / Self Care | Payer: Medicare Other | Attending: Surgery | Admitting: Surgery

## 2022-04-09 ENCOUNTER — Other Ambulatory Visit (HOSPITAL_COMMUNITY): Payer: Self-pay

## 2022-04-09 ENCOUNTER — Ambulatory Visit (HOSPITAL_BASED_OUTPATIENT_CLINIC_OR_DEPARTMENT_OTHER): Payer: Medicare Other | Admitting: Anesthesiology

## 2022-04-09 ENCOUNTER — Encounter (HOSPITAL_COMMUNITY): Payer: Self-pay | Admitting: Surgery

## 2022-04-09 ENCOUNTER — Encounter (HOSPITAL_COMMUNITY)
Admission: RE | Disposition: A | Discharge status: Home / Self Care | Payer: Self-pay | Source: Home / Self Care | Attending: Surgery

## 2022-04-09 ENCOUNTER — Ambulatory Visit (HOSPITAL_COMMUNITY): Payer: Medicare Other | Admitting: Physician Assistant

## 2022-04-09 DIAGNOSIS — Z7901 Long term (current) use of anticoagulants: Secondary | ICD-10-CM | POA: Diagnosis not present

## 2022-04-09 DIAGNOSIS — K4020 Bilateral inguinal hernia, without obstruction or gangrene, not specified as recurrent: Secondary | ICD-10-CM | POA: Insufficient documentation

## 2022-04-09 DIAGNOSIS — I110 Hypertensive heart disease with heart failure: Secondary | ICD-10-CM

## 2022-04-09 DIAGNOSIS — I252 Old myocardial infarction: Secondary | ICD-10-CM

## 2022-04-09 DIAGNOSIS — Z951 Presence of aortocoronary bypass graft: Secondary | ICD-10-CM | POA: Diagnosis not present

## 2022-04-09 DIAGNOSIS — I2510 Atherosclerotic heart disease of native coronary artery without angina pectoris: Secondary | ICD-10-CM | POA: Diagnosis not present

## 2022-04-09 DIAGNOSIS — I509 Heart failure, unspecified: Secondary | ICD-10-CM | POA: Diagnosis not present

## 2022-04-09 DIAGNOSIS — I5042 Chronic combined systolic (congestive) and diastolic (congestive) heart failure: Secondary | ICD-10-CM | POA: Diagnosis not present

## 2022-04-09 DIAGNOSIS — G8918 Other acute postprocedural pain: Secondary | ICD-10-CM | POA: Diagnosis not present

## 2022-04-09 DIAGNOSIS — K5900 Constipation, unspecified: Secondary | ICD-10-CM | POA: Insufficient documentation

## 2022-04-09 DIAGNOSIS — I4891 Unspecified atrial fibrillation: Secondary | ICD-10-CM | POA: Insufficient documentation

## 2022-04-09 DIAGNOSIS — Z9049 Acquired absence of other specified parts of digestive tract: Secondary | ICD-10-CM | POA: Insufficient documentation

## 2022-04-09 HISTORY — PX: INGUINAL HERNIA REPAIR: SHX194

## 2022-04-09 HISTORY — PX: INSERTION OF MESH: SHX5868

## 2022-04-09 SURGERY — REPAIR, HERNIA, INGUINAL, BILATERAL, ADULT
Anesthesia: General | Site: Inguinal | Laterality: Bilateral

## 2022-04-09 MED ORDER — BUPIVACAINE LIPOSOME 1.3 % IJ SUSP
INTRAMUSCULAR | Status: AC
  Filled 2022-04-09: qty 20

## 2022-04-09 MED ORDER — PROPOFOL 10 MG/ML IV BOLUS
INTRAVENOUS | Status: AC
  Filled 2022-04-09: qty 20

## 2022-04-09 MED ORDER — PHENYLEPHRINE 80 MCG/ML (10ML) SYRINGE FOR IV PUSH (FOR BLOOD PRESSURE SUPPORT)
PREFILLED_SYRINGE | INTRAVENOUS | Status: DC | PRN
  Administered 2022-04-09 (×4): 80 ug via INTRAVENOUS

## 2022-04-09 MED ORDER — LACTATED RINGERS IV SOLN: INTRAVENOUS | Status: DC

## 2022-04-09 MED ORDER — OXYCODONE HCL 5 MG PO TABS
5.0000 mg | ORAL_TABLET | Freq: Four times a day (QID) | ORAL | 0 refills | Status: DC | PRN
  Filled 2022-04-09: qty 20, 5d supply, fill #0

## 2022-04-09 MED ORDER — EPHEDRINE 5 MG/ML INJ
INTRAVENOUS | Status: AC
  Filled 2022-04-09: qty 5

## 2022-04-09 MED ORDER — PROPOFOL 10 MG/ML IV BOLUS
INTRAVENOUS | Status: DC | PRN
  Administered 2022-04-09: 130 mg via INTRAVENOUS
  Administered 2022-04-09: 20 mg via INTRAVENOUS

## 2022-04-09 MED ORDER — CHLORHEXIDINE GLUCONATE CLOTH 2 % EX PADS: 6.0000 | MEDICATED_PAD | Freq: Once | CUTANEOUS | Status: DC

## 2022-04-09 MED ORDER — DEXAMETHASONE SODIUM PHOSPHATE 10 MG/ML IJ SOLN
INTRAMUSCULAR | Status: DC | PRN
  Administered 2022-04-09: 10 mg via INTRAVENOUS

## 2022-04-09 MED ORDER — BUPIVACAINE-EPINEPHRINE (PF) 0.25% -1:200000 IJ SOLN
INTRAMUSCULAR | Status: AC
  Filled 2022-04-09: qty 30

## 2022-04-09 MED ORDER — PHENYLEPHRINE 80 MCG/ML (10ML) SYRINGE FOR IV PUSH (FOR BLOOD PRESSURE SUPPORT)
PREFILLED_SYRINGE | INTRAVENOUS | Status: AC
Start: 2022-04-09 — End: ?
  Filled 2022-04-09: qty 10

## 2022-04-09 MED ORDER — LIDOCAINE 2% (20 MG/ML) 5 ML SYRINGE
INTRAMUSCULAR | Status: DC | PRN
  Administered 2022-04-09: 20 mg via INTRAVENOUS

## 2022-04-09 MED ORDER — BUPIVACAINE HCL (PF) 0.25 % IJ SOLN
INTRAMUSCULAR | Status: DC | PRN
  Administered 2022-04-09: 30 mL via PERINEURAL

## 2022-04-09 MED ORDER — FENTANYL CITRATE (PF) 250 MCG/5ML IJ SOLN
INTRAMUSCULAR | Status: DC | PRN
Start: 2022-04-09 — End: 2022-04-09
  Administered 2022-04-09 (×2): 50 ug via INTRAVENOUS

## 2022-04-09 MED ORDER — CHLORHEXIDINE GLUCONATE 0.12 % MT SOLN
15.0000 mL | Freq: Once | OROMUCOSAL | Status: AC
  Administered 2022-04-09: 15 mL via OROMUCOSAL

## 2022-04-09 MED ORDER — CEFAZOLIN SODIUM-DEXTROSE 2-4 GM/100ML-% IV SOLN
2.0000 g | INTRAVENOUS | Status: AC
  Administered 2022-04-09: 2 g via INTRAVENOUS

## 2022-04-09 MED ORDER — EPHEDRINE SULFATE-NACL 50-0.9 MG/10ML-% IV SOSY
PREFILLED_SYRINGE | INTRAVENOUS | Status: DC | PRN
  Administered 2022-04-09: 10 mg via INTRAVENOUS
  Administered 2022-04-09: 5 mg via INTRAVENOUS
  Administered 2022-04-09: 10 mg via INTRAVENOUS
  Administered 2022-04-09 (×2): 5 mg via INTRAVENOUS

## 2022-04-09 MED ORDER — PHENYLEPHRINE HCL-NACL 20-0.9 MG/250ML-% IV SOLN
INTRAVENOUS | Status: DC | PRN
  Administered 2022-04-09: 25 ug/min via INTRAVENOUS

## 2022-04-09 MED ORDER — DEXAMETHASONE SODIUM PHOSPHATE 10 MG/ML IJ SOLN
INTRAMUSCULAR | Status: AC
  Filled 2022-04-09: qty 1

## 2022-04-09 MED ORDER — ONDANSETRON HCL 4 MG/2ML IJ SOLN
INTRAMUSCULAR | Status: DC | PRN
  Administered 2022-04-09: 4 mg via INTRAVENOUS

## 2022-04-09 MED ORDER — BUPIVACAINE-EPINEPHRINE 0.25% -1:200000 IJ SOLN
INTRAMUSCULAR | Status: DC | PRN
  Administered 2022-04-09: 10 mL

## 2022-04-09 MED ORDER — FENTANYL CITRATE (PF) 250 MCG/5ML IJ SOLN
INTRAMUSCULAR | Status: AC
  Filled 2022-04-09: qty 5

## 2022-04-09 MED ORDER — ACETAMINOPHEN 500 MG PO TABS
1000.0000 mg | ORAL_TABLET | ORAL | Status: AC
  Administered 2022-04-09: 1000 mg via ORAL

## 2022-04-09 MED ORDER — LIDOCAINE 2% (20 MG/ML) 5 ML SYRINGE
INTRAMUSCULAR | Status: AC
Start: 2022-04-09 — End: ?
  Filled 2022-04-09: qty 5

## 2022-04-09 MED ORDER — ONDANSETRON HCL 4 MG/2ML IJ SOLN
INTRAMUSCULAR | Status: AC
  Filled 2022-04-09: qty 2

## 2022-04-09 MED ORDER — ORAL CARE MOUTH RINSE: 15.0000 mL | Freq: Once | OROMUCOSAL | Status: AC

## 2022-04-09 MED ORDER — 0.9 % SODIUM CHLORIDE (POUR BTL) OPTIME
TOPICAL | Status: DC | PRN
  Administered 2022-04-09: 1000 mL

## 2022-04-09 MED ORDER — BUPIVACAINE LIPOSOME 1.3 % IJ SUSP
INTRAMUSCULAR | Status: DC | PRN
  Administered 2022-04-09: 10 mL via PERINEURAL

## 2022-04-09 SURGICAL SUPPLY — 42 items
BAG COUNTER SPONGE SURGICOUNT (BAG) ×2 IMPLANT
BENZOIN TINCTURE PRP APPL 2/3 (GAUZE/BANDAGES/DRESSINGS) ×4 IMPLANT
BLADE CLIPPER SURG (BLADE) ×1 IMPLANT
CHLORAPREP W/TINT 26 (MISCELLANEOUS) ×2 IMPLANT
COVER SURGICAL LIGHT HANDLE (MISCELLANEOUS) ×2 IMPLANT
DRAIN PENROSE 1/2X12 LTX STRL (WOUND CARE) ×1 IMPLANT
DRAPE LAPAROTOMY TRNSV 102X78 (DRAPES) ×1 IMPLANT
DRSG TEGADERM 4X4.75 (GAUZE/BANDAGES/DRESSINGS) ×4 IMPLANT
ELECT CAUTERY BLADE 6.4 (BLADE) ×1 IMPLANT
ELECT REM PT RETURN 9FT ADLT (ELECTROSURGICAL) ×2
ELECTRODE REM PT RTRN 9FT ADLT (ELECTROSURGICAL) ×1 IMPLANT
GAUZE 4X4 16PLY ~~LOC~~+RFID DBL (SPONGE) ×2 IMPLANT
GAUZE SPONGE 4X4 12PLY STRL (GAUZE/BANDAGES/DRESSINGS) ×2 IMPLANT
GAUZE SPONGE 4X4 12PLY STRL LF (GAUZE/BANDAGES/DRESSINGS) ×1 IMPLANT
GLOVE BIO SURGEON STRL SZ7 (GLOVE) ×2 IMPLANT
GLOVE BIOGEL PI IND STRL 7.5 (GLOVE) ×1 IMPLANT
GLOVE BIOGEL PI INDICATOR 7.5 (GLOVE) ×1
GOWN STRL REUS W/ TWL LRG LVL3 (GOWN DISPOSABLE) ×2 IMPLANT
GOWN STRL REUS W/TWL LRG LVL3 (GOWN DISPOSABLE) ×4
KIT BASIN OR (CUSTOM PROCEDURE TRAY) ×2 IMPLANT
KIT TURNOVER KIT B (KITS) ×2 IMPLANT
MESH PARIETEX PROGRIP LEFT (Mesh General) ×1 IMPLANT
MESH PARIETEX PROGRIP RIGHT (Mesh General) ×1 IMPLANT
NDL HYPO 25GX1X1/2 BEV (NEEDLE) ×1 IMPLANT
NEEDLE HYPO 25GX1X1/2 BEV (NEEDLE) ×2 IMPLANT
NS IRRIG 1000ML POUR BTL (IV SOLUTION) ×2 IMPLANT
PACK GENERAL/GYN (CUSTOM PROCEDURE TRAY) ×2 IMPLANT
PAD ARMBOARD 7.5X6 YLW CONV (MISCELLANEOUS) ×2 IMPLANT
SPONGE INTESTINAL PEANUT (DISPOSABLE) ×2 IMPLANT
STRIP CLOSURE SKIN 1/2X4 (GAUZE/BANDAGES/DRESSINGS) ×4 IMPLANT
SUT MNCRL AB 4-0 PS2 18 (SUTURE) ×4 IMPLANT
SUT SILK 2 0 SH (SUTURE) IMPLANT
SUT SILK 3 0 (SUTURE)
SUT SILK 3-0 18XBRD TIE 12 (SUTURE) IMPLANT
SUT VIC AB 0 CT2 27 (SUTURE) ×4 IMPLANT
SUT VIC AB 2-0 SH 27 (SUTURE) ×4
SUT VIC AB 2-0 SH 27XBRD (SUTURE) ×2 IMPLANT
SUT VIC AB 3-0 SH 27 (SUTURE) ×6
SUT VIC AB 3-0 SH 27X BRD (SUTURE) ×2 IMPLANT
SYR CONTROL 10ML LL (SYRINGE) ×2 IMPLANT
TOWEL GREEN STERILE (TOWEL DISPOSABLE) ×2 IMPLANT
TOWEL GREEN STERILE FF (TOWEL DISPOSABLE) ×2 IMPLANT

## 2022-04-09 NOTE — Anesthesia Procedure Notes (Signed)
Anesthesia Regional Block: TAP block   Pre-Anesthetic Checklist: , timeout performed,  Correct Patient, Correct Site, Correct Laterality,  Correct Procedure, Correct Position, site marked,  Risks and benefits discussed,  Surgical consent,  Pre-op evaluation,  At surgeon's request and post-op pain management  Laterality: Left and Right  Prep: Maximum Sterile Barrier Precautions used, chloraprep       Needles:  Injection technique: Single-shot  Needle Type: Echogenic Stimulator Needle     Needle Length: 9cm  Needle Gauge: 22     Additional Needles:   Procedures:,,,, ultrasound used (permanent image in chart),,    Narrative:  Start time: 04/09/2022 7:00 AM End time: 04/09/2022 7:05 AM Injection made incrementally with aspirations every 5 mL.  Performed by: Personally  Anesthesiologist: Pervis Hocking, DO  Additional Notes: Monitors applied. No increased pain on injection. No increased resistance to injection. Injection made in 5cc increments. Good needle visualization. Patient tolerated procedure well.

## 2022-04-09 NOTE — Interval H&P Note (Signed)
History and Physical Interval Note:  04/09/2022 7:12 AM  Isaac [name]  has presented today for surgery, with the diagnosis of [place].  The various methods of treatment have been discussed with the patient and family. After consideration of risks, benefits and other options for treatment, the patient has consented to  Procedure(s): OPEN BILATERAL INGUINAL HERNIA REPAIRS WITH MESH (Bilateral) as a surgical intervention.  The patient's history has been reviewed, patient examined, no change in status, stable for surgery.  I have reviewed the patient's chart and labs.  Questions were answered to the patient's satisfaction.     Maia Petties

## 2022-04-09 NOTE — Discharge Instructions (Signed)
[facility] _______[facility] ? ?INGUINAL HERNIA REPAIR: POST OP INSTRUCTIONS ? ?Always review your discharge instruction sheet given to you by the facility where your surgery was performed. ?IF YOU HAVE DISABILITY OR FAMILY LEAVE FORMS, YOU MUST BRING THEM TO THE OFFICE FOR PROCESSING.   ?DO NOT GIVE THEM TO YOUR DOCTOR. ? ?1. A  prescription for pain medication may be given to you upon discharge.  Take your pain medication as prescribed, if needed.  If narcotic pain medicine is not needed, then you may take acetaminophen (Tylenol) or ibuprofen (Advil) as needed. ?2. Take your usually prescribed medications unless otherwise directed. ?If you need a refill on your pain medication, please contact your pharmacy.  They will contact our office to request authorization. Prescriptions will not be filled after 5 pm or on week-ends. ?3. You should follow a light diet the first 24 hours after arrival home, such as soup and crackers, etc.  Be sure to include lots of fluids daily.  Resume your normal diet the day after surgery. ?4.Most patients will experience some swelling and bruising around the umbilicus or in the groin and scrotum.  Ice packs and reclining will help.  Swelling and bruising can take several days to resolve.  ?6. It is common to experience some constipation if taking pain medication after surgery.  Increasing fluid intake and taking a stool softener (such as Colace) will usually help or prevent this problem from occurring.  A mild laxative (Milk of Magnesia or Miralax) should be taken according to package directions if there are no bowel movements after 48 hours. ?7. Unless discharge instructions indicate otherwise, you may remove your bandages 24-48 hours after surgery, and you may shower at that time.  You may have steri-strips (small skin tapes) in place directly over the incision.  These strips should be left on the skin for 7-10 days.  If your surgeon used skin glue on the incision, you may  shower in 24 hours.  The glue will flake off over the next 2-3 weeks.  Any sutures or staples will be removed at the office during your follow-up visit. ?8. ACTIVITIES:  You may resume regular (light) daily activities beginning the next day--such as daily self-care, walking, climbing stairs--gradually increasing activities as tolerated.  You may have sexual intercourse when it is comfortable.  Refrain from any heavy lifting or straining until approved by your doctor. ? ?a.You may drive when you are no longer taking prescription pain medication, you can comfortably wear a seatbelt, and you can safely maneuver your car and apply brakes. ?b.RETURN TO WORK:   ?_____________________________________________ ? ?9.You should see your doctor in the office for a follow-up appointment approximately 2-3 weeks after your surgery.  Make sure that you call for this appointment within a day or two after you arrive home to insure a convenient appointment time. ?10.OTHER INSTRUCTIONS: _________________________ ?   _____________________________________ ? ?WHEN TO CALL YOUR DOCTOR: ?Fever over 101.0 ?Inability to urinate ?Nausea and/or vomiting ?Extreme swelling or bruising ?Continued bleeding from incision. ?Increased pain, redness, or drainage from the incision ? ?The clinic staff is available to answer your questions during regular business hours.  Please don?t hesitate to call and ask to speak to one of the nurses for clinical concerns.  If you have a medical emergency, go to the nearest emergency room or call 911.  A surgeon from [facility] is always on call at the hospital ? ? ?[address], [address], [place], [place]    [address] ? ? [address] [address], [place], [place]   [address] ?[phone] ? [phone] ? FAX [phone] ?Web site: [email or web address] ? ?

## 2022-04-09 NOTE — Anesthesia Postprocedure Evaluation (Signed)
Anesthesia Post Note  Patient: Isaac [name]  Procedure(s) Performed: OPEN BILATERAL INGUINAL HERNIA REPAIRS (Bilateral: Inguinal) INSERTION OF MESH (Bilateral: Inguinal)     Patient location during evaluation: PACU Anesthesia Type: General Level of consciousness: awake and alert, oriented and patient cooperative Pain management: pain level controlled Vital Signs Assessment: post-procedure vital signs reviewed and stable Respiratory status: spontaneous breathing, nonlabored ventilation and respiratory function stable Cardiovascular status: blood pressure returned to baseline and stable Postop Assessment: no apparent nausea or vomiting Anesthetic complications: no   No notable events documented.  Last Vitals:  Vitals:   04/09/22 0923 04/09/22 0930  BP: 123/65 125/66  Pulse: 69 67  Resp: 14 10  Temp: (!) 36.4 C   SpO2: 97% 95%    Last Pain:  Vitals:   04/09/22 0621  TempSrc:   PainSc: 0-No pain                 Pervis Hocking

## 2022-04-09 NOTE — Transfer of Care (Signed)
Immediate Anesthesia Transfer of Care Note  Patient: Isaac [name]  Procedure(s) Performed: OPEN BILATERAL INGUINAL HERNIA REPAIRS (Bilateral: Inguinal) INSERTION OF MESH (Bilateral: Inguinal)  Patient Location: PACU  Anesthesia Type:GA combined with regional for post-op pain  Level of Consciousness: drowsy  Airway & Oxygen Therapy: Patient Spontanous Breathing and Patient connected to nasal cannula oxygen  Post-op Assessment: Report given to RN and Post -op Vital signs reviewed and stable  Post vital signs: Reviewed and stable  Last Vitals:  Vitals Value Taken Time  BP 121/56 04/09/22 0853  Temp    Pulse 66 04/09/22 0855  Resp 14 04/09/22 0855  SpO2 94 % 04/09/22 0855  Vitals shown include unvalidated device data.  Last Pain:  Vitals:   04/09/22 0621  TempSrc:   PainSc: 0-No pain         Complications: No notable events documented.

## 2022-04-09 NOTE — Anesthesia Procedure Notes (Signed)
Procedure Name: LMA Insertion Date/Time: 04/09/2022 7:31 AM Performed by: Colin Benton, CRNA Pre-anesthesia Checklist: Patient identified, Emergency Drugs available, Suction available and Patient being monitored Patient Re-evaluated:Patient Re-evaluated prior to induction Oxygen Delivery Method: Circle System Utilized Preoxygenation: Pre-oxygenation with 100% oxygen Induction Type: IV induction Ventilation: Mask ventilation without difficulty LMA: LMA inserted LMA Size: 4.0 Number of attempts: 1 Placement Confirmation: positive ETCO2 Tube secured with: Tape Dental Injury: Teeth and Oropharynx as per pre-operative assessment

## 2022-04-09 NOTE — Op Note (Addendum)
Bilateral Inguinal Hernia Repair, Open, Procedure Note  Indications: The patient presented with a history of a bilateral, reducible inguinal hernia.    Pre-operative Diagnosis: bilateral reducible inguinal hernia Post-operative Diagnosis: same  Surgeon: Donnie Mesa, MD  Resident: Nicanor Alcon, MD  I was present for the critical and key portions of the surgery and I was immediately available throughout the entire procedure.  I have reviewed and agree with the operative note as documented by the resident.  Anesthesia: General LMA anesthesia/ TAP blocks  ASA Class: 3  Procedure Details  The patient was seen again in the Holding Room. The risks, benefits, complications, treatment options, and expected outcomes were discussed with the patient. The possibilities of reaction to medication, pulmonary aspiration, perforation of viscus, bleeding, recurrent infection, the need for additional procedures, and development of a complication requiring transfusion or further operation were discussed with the patient and/or family. The likelihood of success in repairing the hernia and returning the patient to their previous functional status is good.  There was concurrence with the proposed plan, and informed consent was obtained. The site of surgery was properly noted/marked. The patient was taken to the Operating Room, identified as Isaac [name], and the procedure verified as bilateral inguinal hernia repair. A Time Out was held and the above information confirmed.  The patient was placed in the supine position and underwent induction of anesthesia. The lower abdomen and groin was prepped with Chloraprep and draped in the standard fashion, and 0.25% Marcaine with epinephrine was used to anesthetize the skin over the mid-portion of the inguinal canal. The right side was repaired first. An oblique incision was made. Dissection was carried down through the subcutaneous tissue with cautery to the external  oblique fascia.  We opened the external oblique fascia along the direction of its fibers to the external ring.  The spermatic cord was circumferentially dissected bluntly and retracted with a Penrose drain.  The ilioinguinal nerve was identified and preserved.  The floor of the inguinal canal was inspected and was found to be lax but no direct defect.  We skeletonized the spermatic cord and identified an indirect hernia sac and cord lipoma, which were dissected free and returned to the abdomen. The deep ring was reapproximated with 0-Vicryl figure-of-8 suture.  We used a right-sided anatomic Progrip mesh which was trimmed to size, inserted and deployed across the floor of the inguinal canal. The mesh was tucked underneath the external oblique fascia laterally.  The flap of the mesh was closed around the spermatic cord to recreate the internal inguinal ring.  The mesh was secured to the pubic tubercle and inguinal ligament with 0 Vicryl. The internal ring was reinforced with 0-Vicryl stitch. The external oblique fascia was reapproximated with 2-0 Vicryl.   We then turned out attention to the left side and performed a repair in similar fashion. A laxity of the inguinal floor was again found without direct hernia, and a larger indirect hernia sac was similarly dissected free and returned to the abdomen. The ilioinguinal nerve was encountered and would have been in the way of the mesh laying flat, so was divided with cautery. A left-sided anatomic Progrip anatomic mesh was trimmed to size and again secured to the inguinal ligament and pubic tubercle as described above. The external oblique fascia was reapproximated with 2-0 Vicryl. 3-0 Vicryl was used to close the subcutaneous tissues and 4-0 Monocryl was used to close the skin in subcuticular fashion.  Benzoin and steri-strips were used to  seal the incisions.  Clean dressings were applied.  The patient was then extubated and brought to the recovery room in stable  condition.  All sponge, instrument, and needle counts were correct prior to closure and at the conclusion of the case.  Estimated Blood Loss: Minimal           Complications: None; patient tolerated the procedure well.         Disposition: PACU - hemodynamically stable.         Condition: stable   Imogene Burn. Georgette Dover, MD, [facility]  General Surgery   04/09/2022 9:03 AM

## 2022-04-10 ENCOUNTER — Encounter (HOSPITAL_COMMUNITY): Payer: Self-pay | Admitting: Surgery

## 2022-04-16 ENCOUNTER — Encounter (HOSPITAL_COMMUNITY): Payer: Self-pay | Admitting: Surgery

## 2022-04-25 ENCOUNTER — Other Ambulatory Visit (HOSPITAL_COMMUNITY): Payer: Self-pay

## 2022-05-06 DIAGNOSIS — I5042 Chronic combined systolic (congestive) and diastolic (congestive) heart failure: Secondary | ICD-10-CM | POA: Diagnosis not present

## 2022-05-06 DIAGNOSIS — I2510 Atherosclerotic heart disease of native coronary artery without angina pectoris: Secondary | ICD-10-CM | POA: Diagnosis not present

## 2022-05-06 DIAGNOSIS — E039 Hypothyroidism, unspecified: Secondary | ICD-10-CM | POA: Diagnosis not present

## 2022-05-06 DIAGNOSIS — Z0000 Encounter for general adult medical examination without abnormal findings: Secondary | ICD-10-CM | POA: Diagnosis not present

## 2022-05-06 DIAGNOSIS — I480 Paroxysmal atrial fibrillation: Secondary | ICD-10-CM | POA: Diagnosis not present

## 2022-05-06 DIAGNOSIS — E559 Vitamin D deficiency, unspecified: Secondary | ICD-10-CM | POA: Diagnosis not present

## 2022-05-06 DIAGNOSIS — Z79899 Other long term (current) drug therapy: Secondary | ICD-10-CM | POA: Diagnosis not present

## 2022-05-06 DIAGNOSIS — H532 Diplopia: Secondary | ICD-10-CM | POA: Diagnosis not present

## 2022-05-06 DIAGNOSIS — D6869 Other thrombophilia: Secondary | ICD-10-CM | POA: Diagnosis not present

## 2022-05-06 DIAGNOSIS — R634 Abnormal weight loss: Secondary | ICD-10-CM | POA: Diagnosis not present

## 2022-05-15 ENCOUNTER — Other Ambulatory Visit (HOSPITAL_COMMUNITY): Payer: Self-pay

## 2022-05-17 ENCOUNTER — Telehealth: Payer: Self-pay | Admitting: Internal Medicine

## 2022-05-17 NOTE — Telephone Encounter (Signed)
I am not doing second opinion visits at this time due to current patient volume  Please tell patient

## 2022-05-17 NOTE — Telephone Encounter (Signed)
Good Afternoon Dr Carlean Purl,  Patient was referred by previous gi provider. Patient is not doing a transfer of care. Patient states gi provider is needing a second opinion. Please advise on scheduling.  Thank you

## 2022-05-27 DIAGNOSIS — I5042 Chronic combined systolic (congestive) and diastolic (congestive) heart failure: Secondary | ICD-10-CM | POA: Diagnosis not present

## 2022-05-27 DIAGNOSIS — R198 Other specified symptoms and signs involving the digestive system and abdomen: Secondary | ICD-10-CM | POA: Diagnosis not present

## 2022-05-27 DIAGNOSIS — E559 Vitamin D deficiency, unspecified: Secondary | ICD-10-CM | POA: Diagnosis not present

## 2022-05-28 ENCOUNTER — Other Ambulatory Visit (HOSPITAL_COMMUNITY): Payer: Self-pay

## 2022-05-28 ENCOUNTER — Other Ambulatory Visit: Payer: Self-pay | Admitting: Interventional Cardiology

## 2022-05-28 MED ORDER — APIXABAN 5 MG PO TABS
5.0000 mg | ORAL_TABLET | Freq: Two times a day (BID) | ORAL | 1 refills | Status: DC
  Filled 2022-05-28: qty 180, 90d supply, fill #0
  Filled 2022-08-26: qty 180, 90d supply, fill #1

## 2022-05-28 NOTE — Telephone Encounter (Signed)
Prescription refill request for Eliquis received. Indication: PAF Last office visit: 03/05/22  Linard Millers MD Scr: 1.01 on 04/01/22 Age: 71 Weight: 67.9kg  Based on above findings Eliquis '5mg'$  twice daily is the appropriate dose.  Refill approved.

## 2022-06-06 NOTE — Telephone Encounter (Signed)
Advised patient

## 2022-06-26 ENCOUNTER — Other Ambulatory Visit (HOSPITAL_COMMUNITY): Payer: Self-pay

## 2022-07-02 DIAGNOSIS — E559 Vitamin D deficiency, unspecified: Secondary | ICD-10-CM | POA: Diagnosis not present

## 2022-07-02 DIAGNOSIS — I5042 Chronic combined systolic (congestive) and diastolic (congestive) heart failure: Secondary | ICD-10-CM | POA: Diagnosis not present

## 2022-07-02 DIAGNOSIS — I2510 Atherosclerotic heart disease of native coronary artery without angina pectoris: Secondary | ICD-10-CM | POA: Diagnosis not present

## 2022-07-25 ENCOUNTER — Other Ambulatory Visit (HOSPITAL_COMMUNITY): Payer: Self-pay

## 2022-07-25 MED ORDER — TAMSULOSIN HCL 0.4 MG PO CAPS
0.4000 mg | ORAL_CAPSULE | Freq: Every day | ORAL | 3 refills | Status: DC
  Filled 2022-07-25: qty 90, 90d supply, fill #0
  Filled 2022-10-22: qty 90, 90d supply, fill #1
  Filled 2023-01-20: qty 90, 90d supply, fill #2
  Filled 2023-04-21: qty 90, 90d supply, fill #3

## 2022-08-08 DIAGNOSIS — D6869 Other thrombophilia: Secondary | ICD-10-CM | POA: Diagnosis not present

## 2022-08-08 DIAGNOSIS — I480 Paroxysmal atrial fibrillation: Secondary | ICD-10-CM | POA: Diagnosis not present

## 2022-08-08 DIAGNOSIS — I5042 Chronic combined systolic (congestive) and diastolic (congestive) heart failure: Secondary | ICD-10-CM | POA: Diagnosis not present

## 2022-08-08 DIAGNOSIS — Z79899 Other long term (current) drug therapy: Secondary | ICD-10-CM | POA: Diagnosis not present

## 2022-08-08 DIAGNOSIS — H532 Diplopia: Secondary | ICD-10-CM | POA: Diagnosis not present

## 2022-08-08 DIAGNOSIS — E559 Vitamin D deficiency, unspecified: Secondary | ICD-10-CM | POA: Diagnosis not present

## 2022-08-12 ENCOUNTER — Other Ambulatory Visit (HOSPITAL_COMMUNITY): Payer: Self-pay

## 2022-08-13 ENCOUNTER — Other Ambulatory Visit (HOSPITAL_COMMUNITY): Payer: Self-pay

## 2022-08-21 ENCOUNTER — Encounter (HOSPITAL_COMMUNITY)
Admission: RE | Disposition: A | Discharge status: Home / Self Care | Payer: Self-pay | Source: Home / Self Care | Attending: Gastroenterology

## 2022-08-21 ENCOUNTER — Ambulatory Visit (HOSPITAL_COMMUNITY)
Admission: RE | Admit: 2022-08-21 | Discharge: 2022-08-21 | Disposition: A | Discharge status: Home / Self Care | Payer: Medicare Other | Attending: Gastroenterology | Admitting: Gastroenterology

## 2022-08-21 DIAGNOSIS — R159 Full incontinence of feces: Secondary | ICD-10-CM | POA: Insufficient documentation

## 2022-08-21 HISTORY — PX: ANAL RECTAL MANOMETRY: SHX6358

## 2022-08-21 SURGERY — MANOMETRY, ANORECTAL
Anesthesia: Monitor Anesthesia Care | Laterality: Bilateral

## 2022-08-21 NOTE — Progress Notes (Signed)
Anal manometry done per protocol. Pt tolerated well without distress or complication   

## 2022-08-24 ENCOUNTER — Encounter (HOSPITAL_COMMUNITY): Payer: Self-pay | Admitting: Gastroenterology

## 2022-08-26 ENCOUNTER — Other Ambulatory Visit (HOSPITAL_COMMUNITY): Payer: Self-pay

## 2022-08-28 ENCOUNTER — Other Ambulatory Visit (HOSPITAL_BASED_OUTPATIENT_CLINIC_OR_DEPARTMENT_OTHER): Payer: Self-pay

## 2022-08-28 MED ORDER — INFLUENZA VAC A&B SA ADJ QUAD 0.5 ML IM PRSY
PREFILLED_SYRINGE | INTRAMUSCULAR | 0 refills | Status: DC
  Filled 2022-08-28: qty 0.5, 1d supply, fill #0

## 2022-08-28 MED ORDER — COMIRNATY 30 MCG/0.3ML IM SUSY
PREFILLED_SYRINGE | INTRAMUSCULAR | 0 refills | Status: DC
  Filled 2022-08-28: qty 0.3, 1d supply, fill #0

## 2022-08-29 ENCOUNTER — Other Ambulatory Visit (HOSPITAL_COMMUNITY): Payer: Self-pay

## 2022-09-12 ENCOUNTER — Other Ambulatory Visit (HOSPITAL_COMMUNITY): Payer: Self-pay

## 2022-09-12 DIAGNOSIS — R300 Dysuria: Secondary | ICD-10-CM | POA: Diagnosis not present

## 2022-09-12 DIAGNOSIS — N481 Balanitis: Secondary | ICD-10-CM | POA: Diagnosis not present

## 2022-09-12 MED ORDER — CLOTRIMAZOLE 1 % EX CREA: 1.0000 | TOPICAL_CREAM | Freq: Two times a day (BID) | CUTANEOUS | 0 refills | Status: DC

## 2022-09-26 ENCOUNTER — Other Ambulatory Visit (HOSPITAL_COMMUNITY): Payer: Self-pay

## 2022-09-27 ENCOUNTER — Other Ambulatory Visit (HOSPITAL_COMMUNITY): Payer: Self-pay

## 2022-10-16 ENCOUNTER — Encounter: Payer: Self-pay | Admitting: Interventional Cardiology

## 2022-10-17 NOTE — Therapy (Signed)
OUTPATIENT PHYSICAL THERAPY MALE PELVIC EVALUATION   Patient Name: Isaac [name] MRN: [id number] DOB:[birth year], 71 y.o., male Today's Date: 10/18/2022  END OF SESSION:  PT End of Session - 10/18/22 1152     Visit Number 1    Date for PT Re-Evaluation 01/10/23    Authorization Type [employer] medicare    Authorization - Visit Number 1    Progress Note Due on Visit 10    PT Start Time 0800    PT Stop Time 0845    PT Time Calculation (min) 45 min    Activity Tolerance Patient tolerated treatment well    Behavior During Therapy WFL for tasks assessed/performed             Past Medical History:  Diagnosis Date   CERVICAL SPINE DISORDER, NOS    Chronic systolic (congestive) heart failure (HCC)    EF 40%   Coronary artery disease    a. 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, NOS    Dysrhythmia    PVC's occasionally per pt   Hypertension    METATARSALGIA    Myocardial infarction ([place]) 2010   OSTEOARTHROSIS, LOCAL, SCND, HAND    Paroxysmal atrial fibrillation ([place])    Past Surgical History:  Procedure Laterality Date   ANAL RECTAL MANOMETRY Bilateral 08/21/2022   Procedure: ANO RECTAL MANOMETRY;  Surgeon: Arta Silence, MD;  Location: [facility];  Service: Gastroenterology;  Laterality: Bilateral;   APPENDECTOMY     CHOLECYSTECTOMY     CORONARY ARTERY BYPASS GRAFT  10/11/2009   ELECTROPHYSIOLOGIC STUDY N/A 09/17/2016   Procedure: Atrial Fibrillation Ablation;  Surgeon: Thompson Grayer, MD;  Location: [facility];  Service: Cardiovascular;  Laterality: N/A;   INGUINAL HERNIA REPAIR Bilateral 04/09/2022   Procedure: OPEN BILATERAL INGUINAL HERNIA REPAIRS;  Surgeon: Donnie Mesa, MD;  Location: [facility];  Service: General;  Laterality: Bilateral;   INSERTION OF MESH Bilateral 04/09/2022   Procedure: INSERTION OF MESH;  Surgeon: Donnie Mesa, MD;  Location: [facility];  Service: General;  Laterality: Bilateral;   MULTIPLE TOOTH EXTRACTIONS  2000   pt states he also went  under anesthesia for another dental surgery 2005 (he states they had to "break his jaw" for an overbite that he had)   [place]   Patient Active Problem List   Diagnosis Date Noted   PVC's (premature ventricular contractions) 01/14/2022   A-fib ([place]) 09/17/2016   Paroxysmal atrial fibrillation ([place]) 12/26/2015   Visit for monitoring Tikosyn therapy 12/26/2015   Chronic anticoagulation 12/22/2015   Hyperlipidemia LDL goal <70 09/28/2013   Chronic combined systolic and diastolic CHF, NYHA class 2 ([place]) 09/28/2013    Class: Chronic   Chest pain, unspecified 05/21/2012   Coronary artery disease involving coronary bypass graft of native heart with angina pectoris ([place]) 01/06/2012   MI (myocardial infarction) ([place]) 01/06/2012   S/P CABG (coronary artery bypass graft) 01/06/2012   Advice or immunization for travel 01/06/2012   DEGENERATIVE JOINT DISEASE, HANDS 08/23/2009   ELEVATED BP READING WITHOUT DX HYPERTENSION 04/08/2008   PLANTAR WART 09/22/2007   METATARSALGIA 09/22/2007   ECZEMA 06/30/2007   OSTEOARTHROSIS, LOCAL, SCND, HAND 06/30/2007   SKIN CANCER 01/08/2007   TREMOR, ESSENTIAL/FAMILIAL 01/08/2007   DIVERTICULITIS OF COLON, NOS 01/08/2007   [place] SYNDROME, NO MYELOPATHY, NOS 01/08/2007   CERVICAL SPINE DISORDER, NOS 01/08/2007    PCP: Josetta Huddle, MD  REFERRING PROVIDER: Arta Silence, MD   REFERRING DIAG: R15.9 (ICD-10-CM) - Full incontinence  of feces   THERAPY DIAG:  Muscle weakness (generalized)  Other lack of coordination  Rationale for Evaluation and Treatment: Rehabilitation  ONSET DATE: 11/2018  SUBJECTIVE:                                                                                                                                                                                           SUBJECTIVE STATEMENT: Mostly liquid would come out. Patient had manometry test.   PAIN:  Are you having pain? No   PRECAUTIONS:  None  WEIGHT BEARING RESTRICTIONS: No  FALLS:  Has patient fallen in last 6 months? No  LIVING ENVIRONMENT: Lives with: lives with their family  OCCUPATION: retired, walk 3-4 times per week, ride exercise 2x per week  PLOF: Independent  PATIENT GOALS: reduce leakage  PERTINENT HISTORY:  Chronic systolic heart failure; Myocardial infarction, Appendectomy; cholecystectomy; Inguinal hernia repair bil. 04/09/22  BOWEL MOVEMENT: Pain with bowel movement: No Type of bowel movement:Type (Bristol Stool Scale) Type 2 or 3, Frequency 1 time per day in morning, and Strain No Fully empty rectum: Yes: does not always feel all of the stool come out Leakage: Yes: prior to [employer] every 7-10 days but now with [employer] 1 time per month and is all liquid and is tiny drops Pads: Yes: 1 shield per day   URINATION: Pain with urination: No Fully empty bladder: Yes:   Stream: Weak Urgency: Yes:   Frequency: normal,  Leakage:  on occasion , running water  I OBJECTIVE:   DIAGNOSTIC FINDINGS:  Manometry test indicated Decreased anal sphincter resting pressure and squeeze pressure Equivocal findings for dyssynergic defecation with minimally decreased rectal sensation   COGNITION: Overall cognitive status: Within functional limits for tasks assessed     SENSATION: Light touch: Appears intact Proprioception: Appears intact    POSTURE: No Significant postural limitations  PELVIC ALIGNMENT:  LUMBARAROM/PROM:  A/PROM A/PROM  eval  Flexion full  Extension Decreased by 25%  Right lateral flexion Decreased by 25%  Left lateral flexion Decreased by 25%  Right rotation Decreased by 25%  Left rotation Decreased by 25%   (Blank rows = not tested)   LOWER EXTREMITY MMT:  MMT Right eval Left eval  Hip flexion 5/5 5/5  Hip extension 4/5 4/5  Hip abduction 4/5 4/5   PALPATION:   General  has difficulty with contracting the upper abdominals                External Perineal Exam  intact  Internal Pelvic Floor tightness in the anococcygeal ligament and puborectalis; the puborectalis does not come forward; difficulty pushing therapist finger out of the canal and instead contracted  Patient confirms identification and approves PT to assess internal pelvic floor and treatment Yes  PELVIC MMT:   MMT eval  Internal Anal Sphincter 2/5  External Anal Sphincter 2/5  Puborectalis 2/5  (Blank rows = not tested)        TONE: Increased   TODAY'S TREATMENT:                                                                                                                              DATE: 10/18/2022  EVAL pelvic floor contraction   PATIENT EDUCATION:  Education details: Pelvic floor contraction Person educated: Patient Education method: Explanation, Demonstration, Tactile cues, Verbal cues, and Handouts Education comprehension: verbalized understanding, returned demonstration, verbal cues required, tactile cues required, and needs further education  HOME EXERCISE PROGRAM: See above.   ASSESSMENT:  CLINICAL IMPRESSION: Patient is a 70 y.o. male who was seen today for physical therapy evaluation and treatment for fecal incontinence. Patient has been having issues with fecal incontinence for  several years. He will leak liquid and has reduced with a new supplement he is taking. He wears 1 thin pad per day. He does not know when it will happen. He has trouble feeling all of the stool coming out. Pelvic floor strength is 2/5. His puborectalis does not come forward with contraction. He has difficulty with pushing the therapist finger out and will contract the pelvic floor. His abdominals contract more in the lower area compared to the upper. Patient is shaky when standing on one leg. Patient will benefit from skilled therapy to improve pelvic floor strength and reduce fecal leakage.    OBJECTIVE IMPAIRMENTS: decreased coordination, decreased endurance,  decreased strength, and impaired sensation.   ACTIVITY LIMITATIONS: continence and toileting  PARTICIPATION LIMITATIONS: community activity  PERSONAL FACTORS: Age and 1-2 comorbidities: Chronic systolic heart failure; Myocardial infarction, Appendectomy; cholecystectomy; Inguinal hernia repair bil. 04/09/22  are also affecting patient's functional outcome.   REHAB POTENTIAL: Excellent  CLINICAL DECISION MAKING: Evolving/moderate complexity  EVALUATION COMPLEXITY: Moderate   GOALS: Goals reviewed with patient? Yes  SHORT TERM GOALS: Target date: 11/15/2021  Patient able to fully contract his rectum with isolation and lift the pelvic floor.  Baseline: Goal status: INITIAL  2.  Patient understands how to contract his lower and upper abdominals together.  Baseline:  Goal status: INITIAL  3.  Patient is able to push the therapist finger out without contracting the anus.  Baseline:  Goal status: INITIAL   LONG TERM GOALS: Target date: 01/09/2022  Patient independent with advanced HEP for core and pelvic floor to reduce fecal leakage.  Baseline:  Goal status: INITIAL  2.  Pelvic floor strength is >/= 3/5 without contracting his gluteal reduce leakage.  Baseline:  Goal status: INITIAL  3.  Patient does not need  to wear a pad due to reduction of fecal leakage.  Baseline:  Goal status: INITIAL  4.  Patient has increased sensation and awareness of the stool in his rectum and it coming out of the rectum due to increased strength of the anal muscles.  Baseline:  Goal status: INITIAL   PLAN:  PT FREQUENCY: 1x/week  PT DURATION: 12 weeks  PLANNED INTERVENTIONS: Therapeutic exercises, Therapeutic activity, Neuromuscular re-education, Balance training, Gait training, Patient/Family education, Joint mobilization, Dry Needling, Spinal mobilization, Biofeedback, and Manual therapy  PLAN FOR NEXT SESSION: manual work to the puborectalis working on circular contraction and lift,  diaphragmatic breathing to bulge the pelvic floor, abdominal contraction    Earlie Counts, PT 10/18/22 12:04 PM

## 2022-10-18 ENCOUNTER — Encounter: Payer: Self-pay | Admitting: Physical Therapy

## 2022-10-18 ENCOUNTER — Ambulatory Visit: Payer: Medicare Other | Attending: Gastroenterology | Admitting: Physical Therapy

## 2022-10-18 ENCOUNTER — Other Ambulatory Visit: Payer: Self-pay

## 2022-10-18 DIAGNOSIS — R278 Other lack of coordination: Secondary | ICD-10-CM | POA: Diagnosis not present

## 2022-10-18 DIAGNOSIS — M6281 Muscle weakness (generalized): Secondary | ICD-10-CM | POA: Insufficient documentation

## 2022-10-21 ENCOUNTER — Encounter: Payer: Self-pay | Admitting: Physical Therapy

## 2022-10-22 ENCOUNTER — Other Ambulatory Visit (HOSPITAL_COMMUNITY): Payer: Self-pay

## 2022-11-15 ENCOUNTER — Encounter: Payer: Self-pay | Admitting: Physical Therapy

## 2022-11-15 ENCOUNTER — Ambulatory Visit: Payer: Medicare Other | Attending: Gastroenterology | Admitting: Physical Therapy

## 2022-11-15 DIAGNOSIS — M6281 Muscle weakness (generalized): Secondary | ICD-10-CM | POA: Insufficient documentation

## 2022-11-15 DIAGNOSIS — R278 Other lack of coordination: Secondary | ICD-10-CM | POA: Diagnosis not present

## 2022-11-15 NOTE — Therapy (Signed)
OUTPATIENT PHYSICAL THERAPY TREATMENT NOTE   Patient Name: Isaac [name] MRN: [id number] DOB:[birth year], 72 y.o., male Today's Date: 11/15/2022  PCP: Josetta Huddle, MD  REFERRING PROVIDER: Arta Silence, MD    END OF SESSION:   PT End of Session - 11/15/22 1017     Visit Number 2    Date for PT Re-Evaluation 01/10/23    Authorization Type [employer] medicare    PT Start Time 1015    PT Stop Time 1100    PT Time Calculation (min) 45 min    Activity Tolerance Patient tolerated treatment well    Behavior During Therapy WFL for tasks assessed/performed             Past Medical History:  Diagnosis Date   CERVICAL SPINE DISORDER, NOS    Chronic systolic (congestive) heart failure (HCC)    EF 40%   Coronary artery disease    a. 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, NOS    Dysrhythmia    PVC's occasionally per pt   Hypertension    METATARSALGIA    Myocardial infarction ([place]) 2010   OSTEOARTHROSIS, LOCAL, SCND, HAND    Paroxysmal atrial fibrillation ([place])    Past Surgical History:  Procedure Laterality Date   ANAL RECTAL MANOMETRY Bilateral 08/21/2022   Procedure: ANO RECTAL MANOMETRY;  Surgeon: Arta Silence, MD;  Location: [facility];  Service: Gastroenterology;  Laterality: Bilateral;   APPENDECTOMY     CHOLECYSTECTOMY     CORONARY ARTERY BYPASS GRAFT  10/11/2009   ELECTROPHYSIOLOGIC STUDY N/A 09/17/2016   Procedure: Atrial Fibrillation Ablation;  Surgeon: Thompson Grayer, MD;  Location: [facility];  Service: Cardiovascular;  Laterality: N/A;   INGUINAL HERNIA REPAIR Bilateral 04/09/2022   Procedure: OPEN BILATERAL INGUINAL HERNIA REPAIRS;  Surgeon: Donnie Mesa, MD;  Location: [facility];  Service: General;  Laterality: Bilateral;   INSERTION OF MESH Bilateral 04/09/2022   Procedure: INSERTION OF MESH;  Surgeon: Donnie Mesa, MD;  Location: [facility];  Service: General;  Laterality: Bilateral;   MULTIPLE TOOTH EXTRACTIONS  2000   pt states he also went under  anesthesia for another dental surgery 2005 (he states they had to "break his jaw" for an overbite that he had)   [place]   Patient Active Problem List   Diagnosis Date Noted   PVC's (premature ventricular contractions) 01/14/2022   A-fib ([place]) 09/17/2016   Paroxysmal atrial fibrillation ([place]) 12/26/2015   Visit for monitoring Tikosyn therapy 12/26/2015   Chronic anticoagulation 12/22/2015   Hyperlipidemia LDL goal <70 09/28/2013   Chronic combined systolic and diastolic CHF, NYHA class 2 ([place]) 09/28/2013    Class: Chronic   Chest pain, unspecified 05/21/2012   Coronary artery disease involving coronary bypass graft of native heart with angina pectoris ([place]) 01/06/2012   MI (myocardial infarction) ([place]) 01/06/2012   S/P CABG (coronary artery bypass graft) 01/06/2012   Advice or immunization for travel 01/06/2012   DEGENERATIVE JOINT DISEASE, HANDS 08/23/2009   ELEVATED BP READING WITHOUT DX HYPERTENSION 04/08/2008   PLANTAR WART 09/22/2007   METATARSALGIA 09/22/2007   ECZEMA 06/30/2007   OSTEOARTHROSIS, LOCAL, SCND, HAND 06/30/2007   SKIN CANCER 01/08/2007   TREMOR, ESSENTIAL/FAMILIAL 01/08/2007   DIVERTICULITIS OF COLON, NOS 01/08/2007   [place] SYNDROME, NO MYELOPATHY, NOS 01/08/2007   CERVICAL SPINE DISORDER, NOS 01/08/2007   REFERRING DIAG: R15.9 (ICD-10-CM) - Full incontinence of feces    THERAPY DIAG:  Muscle weakness (generalized)   Other lack of coordination  Rationale for Evaluation and Treatment: Rehabilitation   ONSET DATE: 11/2018   SUBJECTIVE:                                                                                                                                                                                            SUBJECTIVE STATEMENT: I had trouble with feeling the muscle move in the coccyx. I had 1 minor bathroom leakage.    PAIN:  Are you having pain? No     PRECAUTIONS: None   WEIGHT BEARING RESTRICTIONS: No    FALLS:  Has patient fallen in last 6 months? No   LIVING ENVIRONMENT: Lives with: lives with their family   OCCUPATION: retired, walk 3-4 times per week, ride exercise 2x per week   PLOF: Independent   PATIENT GOALS: reduce leakage   PERTINENT HISTORY:  Chronic systolic heart failure; Myocardial infarction, Appendectomy; cholecystectomy; Inguinal hernia repair bil. 04/09/22   BOWEL MOVEMENT: Pain with bowel movement: No Type of bowel movement:Type (Bristol Stool Scale) Type 2 or 3, Frequency 1 time per day in morning, and Strain No Fully empty rectum: Yes: does not always feel all of the stool come out Leakage: Yes: prior to [employer] every 7-10 days but now with [employer] 1 time per month and is all liquid and is tiny drops Pads: Yes: 1 shield per day    URINATION: Pain with urination: No Fully empty bladder: Yes:   Stream: Weak Urgency: Yes:   Frequency: normal,  Leakage:  on occasion , running water   I OBJECTIVE:    DIAGNOSTIC FINDINGS:  Manometry test indicated Decreased anal sphincter resting pressure and squeeze pressure Equivocal findings for dyssynergic defecation with minimally decreased rectal sensation     COGNITION: Overall cognitive status: Within functional limits for tasks assessed                          SENSATION: Light touch: Appears intact Proprioception: Appears intact       POSTURE: No Significant postural limitations   PELVIC ALIGNMENT:   LUMBARAROM/PROM:   A/PROM A/PROM  eval  Flexion full  Extension Decreased by 25%  Right lateral flexion Decreased by 25%  Left lateral flexion Decreased by 25%  Right rotation Decreased by 25%  Left rotation Decreased by 25%   (Blank rows = not tested)     LOWER EXTREMITY MMT:   MMT Right eval Left eval  Hip flexion 5/5 5/5  Hip extension 4/5 4/5  Hip abduction 4/5 4/5    PALPATION:   General  has difficulty with contracting the upper abdominals  External Perineal  Exam intact                             Internal Pelvic Floor tightness in the anococcygeal ligament and puborectalis; the puborectalis does not come forward; difficulty pushing therapist finger out of the canal and instead contracted   Patient confirms identification and approves PT to assess internal pelvic floor and treatment Yes   PELVIC MMT:   MMT eval 11/15/22  Internal Anal Sphincter 2/5 3/5 holding for 20 sec 2x, 8 sec 5x  External Anal Sphincter 2/5 3/5 holding for 20 sec 2x, 8 sec 5x  Puborectalis 2/5 3/5 holding for 20 sec 2x, 8 sec 5x  (Blank rows = not tested)         TONE: Increased    TODAY'S TREATMENT:   11/15/2022 Manual: Internal pelvic floor techniques:No emotional/communication barriers or cognitive limitation. Patient is motivated to learn. Patient understands and agrees with treatment goals and plan. PT explains patient will be examined in standing, sitting, and lying down to see how their muscles and joints work. When they are ready, they will be asked to remove their underwear so PT can examine their perineum. The patient is also given the option of providing their own chaperone as one is not provided in our facility. The patient also has the right and is explained the right to defer or refuse any part of the evaluation or treatment including the internal exam. With the patient's consent, PT will use one gloved finger to gently assess the muscles of the pelvic floor, seeing how well it contracts and relaxes and if there is muscle symmetry. After, the patient will get dressed and PT and patient will discuss exam findings and plan of care. PT and patient discuss plan of care, schedule, attendance policy and HEP activities.  Manual work to the puborectalis and anococcygeal ligament Neuromuscular re-education: Core facilitation: Supine transverse abdominus  holding 5 seconds with ball between knees Hip flexion isometrics holding 5 second with abdominal contraction Sitting  lean back with abdominal contraction holding 5 sec 10x Pelvic floor contraction training: Therapist finger in the anal canal working on contraction for 20 seconds and 8 seconds Down training: Diaphragmatic breathing to lengthen the pelvic floor in sidely                                                                                                                              PATIENT EDUCATION:  11/15/22 Education details: Access Code: [email or web address] Person educated: Patient Education method: Explanation, Demonstration, Tactile cues, Verbal cues, and Handouts Education comprehension: verbalized understanding, returned demonstration, verbal cues required, tactile cues required, and needs further education   HOME EXERCISE PROGRAM: 11/15/2022 Access Code: [email or web address] URL: [email or web address] Date: 11/15/2022 Prepared by: Earlie Counts  Exercises - Hooklying Transversus Abdominis Palpation  - 1 x daily - 7 x weekly - 1 sets - 10 reps - Hooklying Isometric Hip  Flexion  - 1 x daily - 7 x weekly - 2 sets - 5 reps - Seated Lean Back on Swiss Ball  - 1 x daily - 7 x weekly - 1 sets - 10 reps - Seated Pelvic Floor Contraction  - 3 x daily - 7 x weekly - Supine Diaphragmatic Breathing  - 2 x daily - 7 x weekly - 1 sets - 10 reps   ASSESSMENT:   CLINICAL IMPRESSION: Patient is a 72 y.o. male who was seen today for physical therapy  treatment for fecal incontinence. Patient has only had 1 fecal leakage in the past month. Pelvic floor strength has increased to 3/5 and the puborectalis comes forward. Patient will contract the pelvic floor when trying to bearing down. Patient has difficulty with bulging of the pelvic floor. Patient will benefit from skilled therapy to improve pelvic floor strength and reduce fecal leakage.     OBJECTIVE IMPAIRMENTS: decreased coordination, decreased endurance, decreased strength, and impaired sensation.    ACTIVITY LIMITATIONS: continence and toileting    PARTICIPATION LIMITATIONS: community activity   PERSONAL FACTORS: Age and 1-2 comorbidities: Chronic systolic heart failure; Myocardial infarction, Appendectomy; cholecystectomy; Inguinal hernia repair bil. 04/09/22  are also affecting patient's functional outcome.    REHAB POTENTIAL: Excellent   CLINICAL DECISION MAKING: Evolving/moderate complexity   EVALUATION COMPLEXITY: Moderate     GOALS: Goals reviewed with patient? Yes   SHORT TERM GOALS: Target date: 11/15/2021   Patient able to fully contract his rectum with isolation and lift the pelvic floor.  Baseline: Goal status: Met 11/15/22   2.  Patient understands how to contract his lower and upper abdominals together.  Baseline:  Goal status: INITIAL   3.  Patient is able to push the therapist finger out without contracting the anus.  Baseline:  Goal status: INITIAL     LONG TERM GOALS: Target date: 01/09/2022   Patient independent with advanced HEP for core and pelvic floor to reduce fecal leakage.  Baseline:  Goal status: INITIAL   2.  Pelvic floor strength is >/= 3/5 without contracting his gluteal reduce leakage.  Baseline:  Goal status: INITIAL   3.  Patient does not need to wear a pad due to reduction of fecal leakage.  Baseline:  Goal status: INITIAL   4.  Patient has increased sensation and awareness of the stool in his rectum and it coming out of the rectum due to increased strength of the anal muscles.  Baseline:  Goal status: INITIAL     PLAN:   PT FREQUENCY: 1x/week   PT DURATION: 12 weeks   PLANNED INTERVENTIONS: Therapeutic exercises, Therapeutic activity, Neuromuscular re-education, Balance training, Gait training, Patient/Family education, Joint mobilization, Dry Needling, Spinal mobilization, Biofeedback, and Manual therapy   PLAN FOR NEXT SESSION:  diaphragmatic breathing to bulge the pelvic floor, abdominal contraction , pelvic floor EMG for release of pelvic floor with bearing down.     Earlie Counts, PT 11/15/22 11:18 AM

## 2022-11-18 ENCOUNTER — Encounter: Payer: Self-pay | Admitting: Physical Therapy

## 2022-11-18 ENCOUNTER — Ambulatory Visit: Payer: Medicare Other | Admitting: Physical Therapy

## 2022-11-18 DIAGNOSIS — M6281 Muscle weakness (generalized): Secondary | ICD-10-CM

## 2022-11-18 DIAGNOSIS — R278 Other lack of coordination: Secondary | ICD-10-CM

## 2022-11-18 NOTE — Patient Instructions (Signed)
Foot work all: maintain core (front & back part of pelvic floor) throughout each position WITH a short rest break in between.   Arms in straps: whole series but take a short rest break (no flow) in between each motion to rest pelvic floor then re-establish the contractions before going onto the next. In time, as you get stronger, add the flow.   Feet in straps: see above  Seated long box facing backing:parameters

## 2022-11-18 NOTE — Therapy (Signed)
OUTPATIENT PHYSICAL THERAPY TREATMENT NOTE   Patient Name: Isaac [name] MRN: [id number] DOB:Feb 25, 1951, 72 y.o., male Today's Date: 11/18/2022  PCP: Josetta Huddle, MD  REFERRING PROVIDER: Arta Silence, MD    END OF SESSION:   PT End of Session - 11/18/22 1153     Visit Number 3    Date for PT Re-Evaluation 01/10/23    Authorization Type [employer] medicare    Authorization - Visit Number 2    Progress Note Due on Visit 10    PT Start Time 1150    PT Stop Time 1228    PT Time Calculation (min) 38 min    Activity Tolerance Patient tolerated treatment well    Behavior During Therapy WFL for tasks assessed/performed              Past Medical History:  Diagnosis Date   CERVICAL SPINE DISORDER, NOS    Chronic systolic (congestive) heart failure (HCC)    EF 40%   Coronary artery disease    a. 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, NOS    Dysrhythmia    PVC's occasionally per pt   Hypertension    METATARSALGIA    Myocardial infarction ([place]) 2010   OSTEOARTHROSIS, LOCAL, SCND, HAND    Paroxysmal atrial fibrillation ([place])    Past Surgical History:  Procedure Laterality Date   ANAL RECTAL MANOMETRY Bilateral 08/21/2022   Procedure: ANO RECTAL MANOMETRY;  Surgeon: Arta Silence, MD;  [facility];  Service: Gastroenterology;  Laterality: Bilateral;   APPENDECTOMY     CHOLECYSTECTOMY     CORONARY ARTERY BYPASS GRAFT  10/11/2009   ELECTROPHYSIOLOGIC STUDY N/A 09/17/2016   Procedure: Atrial Fibrillation Ablation;  Surgeon: Thompson Grayer, MD;  Location: [facility];  Service: Cardiovascular;  Laterality: N/A;   INGUINAL HERNIA REPAIR Bilateral 04/09/2022   Procedure: OPEN BILATERAL INGUINAL HERNIA REPAIRS;  Surgeon: Donnie Mesa, MD;  Location: [facility];  Service: General;  Laterality: Bilateral;   INSERTION OF MESH Bilateral 04/09/2022   Procedure: INSERTION OF MESH;  Surgeon: Donnie Mesa, MD;  Location: [facility];  Service: General;  Laterality: Bilateral;    MULTIPLE TOOTH EXTRACTIONS  2000   pt states he also went under anesthesia for another dental surgery 2005 (he states they had to "break his jaw" for an overbite that he had)   [place]   Patient Active Problem List   Diagnosis Date Noted   PVC's (premature ventricular contractions) 01/14/2022   A-fib ([place]) 09/17/2016   Paroxysmal atrial fibrillation ([place]) 12/26/2015   Visit for monitoring Tikosyn therapy 12/26/2015   Chronic anticoagulation 12/22/2015   Hyperlipidemia LDL goal <70 09/28/2013   Chronic combined systolic and diastolic CHF, NYHA class 2 ([place]) 09/28/2013    Class: Chronic   Chest pain, unspecified 05/21/2012   Coronary artery disease involving coronary bypass graft of native heart with angina pectoris ([place]) 01/06/2012   MI (myocardial infarction) ([place]) 01/06/2012   S/P CABG (coronary artery bypass graft) 01/06/2012   Advice or immunization for travel 01/06/2012   DEGENERATIVE JOINT DISEASE, HANDS 08/23/2009   ELEVATED BP READING WITHOUT DX HYPERTENSION 04/08/2008   PLANTAR WART 09/22/2007   METATARSALGIA 09/22/2007   ECZEMA 06/30/2007   OSTEOARTHROSIS, LOCAL, SCND, HAND 06/30/2007   SKIN CANCER 01/08/2007   TREMOR, ESSENTIAL/FAMILIAL 01/08/2007   DIVERTICULITIS OF COLON, NOS 01/08/2007   [place] SYNDROME, NO MYELOPATHY, NOS 01/08/2007   CERVICAL SPINE DISORDER, NOS 01/08/2007   REFERRING DIAG: R15.9 (ICD-10-CM) - Full  incontinence of feces    THERAPY DIAG:  Muscle weakness (generalized)   Other lack of coordination   Rationale for Evaluation and Treatment: Rehabilitation   ONSET DATE: 11/2018   SUBJECTIVE:                                                                                                                                                                                            SUBJECTIVE STATEMENT: I did fine after my last session.   PAIN:  Are you having pain? No     PRECAUTIONS: None   WEIGHT BEARING RESTRICTIONS:  No   FALLS:  Has patient fallen in last 6 months? No   LIVING ENVIRONMENT: Lives with: lives with their family   OCCUPATION: retired, walk 3-4 times per week, ride exercise 2x per week   PLOF: Independent   PATIENT GOALS: reduce leakage   PERTINENT HISTORY:  Chronic systolic heart failure; Myocardial infarction, Appendectomy; cholecystectomy; Inguinal hernia repair bil. 04/09/22   BOWEL MOVEMENT: Pain with bowel movement: No Type of bowel movement:Type (Bristol Stool Scale) Type 2 or 3, Frequency 1 time per day in morning, and Strain No Fully empty rectum: Yes: does not always feel all of the stool come out Leakage: Yes: prior to [employer] every 7-10 days but now with [employer] 1 time per month and is all liquid and is tiny drops Pads: Yes: 1 shield per day    URINATION: Pain with urination: No Fully empty bladder: Yes:   Stream: Weak Urgency: Yes:   Frequency: normal,  Leakage:  on occasion , running water   I OBJECTIVE:    DIAGNOSTIC FINDINGS:  Manometry test indicated Decreased anal sphincter resting pressure and squeeze pressure Equivocal findings for dyssynergic defecation with minimally decreased rectal sensation     COGNITION: Overall cognitive status: Within functional limits for tasks assessed                          SENSATION: Light touch: Appears intact Proprioception: Appears intact       POSTURE: No Significant postural limitations   PELVIC ALIGNMENT:   LUMBARAROM/PROM:   A/PROM A/PROM  eval  Flexion full  Extension Decreased by 25%  Right lateral flexion Decreased by 25%  Left lateral flexion Decreased by 25%  Right rotation Decreased by 25%  Left rotation Decreased by 25%   (Blank rows = not tested)     LOWER EXTREMITY MMT:   MMT Right eval Left eval  Hip flexion 5/5 5/5  Hip extension 4/5 4/5  Hip abduction 4/5 4/5    PALPATION:   General  has difficulty with contracting the upper abdominals                 External  Perineal Exam intact                             Internal Pelvic Floor tightness in the anococcygeal ligament and puborectalis; the puborectalis does not come forward; difficulty pushing therapist finger out of the canal and instead contracted   Patient confirms identification and approves PT to assess internal pelvic floor and treatment Yes   PELVIC MMT:   MMT eval 11/15/22  Internal Anal Sphincter 2/5 3/5 holding for 20 sec 2x, 8 sec 5x  External Anal Sphincter 2/5 3/5 holding for 20 sec 2x, 8 sec 5x  Puborectalis 2/5 3/5 holding for 20 sec 2x, 8 sec 5x  (Blank rows = not tested)         TONE: Increased    TODAY'S TREATMENT:    11/18/21: Nustep for warm up; new model L5 5 min Diaphragmatic Pilates breath: 5x concentrating on more breath in and more breath out Added TA 6x, TA and back of pelvic floor contraction 6x concurrent Added leg lift with above; 6x Bil Educated pt in Pilates Reformer exercises he can do at home with his wife who instructs.(See pt instructions)  PTA emphasized his intention is his "front and back" while working on Paediatric nurse.    11/15/2022 Manual: Internal pelvic floor techniques:No emotional/communication barriers or cognitive limitation. Patient is motivated to learn. Patient understands and agrees with treatment goals and plan. PT explains patient will be examined in standing, sitting, and lying down to see how their muscles and joints work. When they are ready, they will be asked to remove their underwear so PT can examine their perineum. The patient is also given the option of providing their own chaperone as one is not provided in our facility. The patient also has the right and is explained the right to defer or refuse any part of the evaluation or treatment including the internal exam. With the patient's consent, PT will use one gloved finger to gently assess the muscles of the pelvic floor, seeing how well it contracts and relaxes and if there is muscle  symmetry. After, the patient will get dressed and PT and patient will discuss exam findings and plan of care. PT and patient discuss plan of care, schedule, attendance policy and HEP activities.  Manual work to the puborectalis and anococcygeal ligament Neuromuscular re-education: Core facilitation: Supine transverse abdominus  holding 5 seconds with ball between knees Hip flexion isometrics holding 5 second with abdominal contraction Sitting lean back with abdominal contraction holding 5 sec 10x Pelvic floor contraction training: Therapist finger in the anal canal working on contraction for 20 seconds and 8 seconds Down training: Diaphragmatic breathing to lengthen the pelvic floor in sidely  PATIENT EDUCATION:  11/15/22 Education details: Access Code: [email or web address] Person educated: Patient Education method: Explanation, Demonstration, Tactile cues, Verbal cues, and Handouts Education comprehension: verbalized understanding, returned demonstration, verbal cues required, tactile cues required, and needs further education   HOME EXERCISE PROGRAM: 11/15/2022 Access Code: [email or web address] URL: [email or web address] Date: 11/15/2022 Prepared by: Earlie Counts  Exercises - Hooklying Transversus Abdominis Palpation  - 1 x daily - 7 x weekly - 1 sets - 10 reps - Hooklying Isometric Hip Flexion  - 1 x daily - 7 x weekly - 2 sets - 5 reps - Seated Lean Back on Swiss Ball  - 1 x daily - 7 x weekly - 1 sets - 10 reps - Seated Pelvic Floor Contraction  - 3 x daily - 7 x weekly - Supine Diaphragmatic Breathing  - 2 x daily - 7 x weekly - 1 sets - 10 reps   ASSESSMENT:   CLINICAL IMPRESSION: Pt arrives to PT with no new complaints. Pt has a Research officer, political party at home and his wife is an Art therapist. Pt was instructed ( with written copy for his wife) of 4 different Pilates  exercises he can work on at home focusing on core stabilization and back of the pelvic floor strength.      OBJECTIVE IMPAIRMENTS: decreased coordination, decreased endurance, decreased strength, and impaired sensation.    ACTIVITY LIMITATIONS: continence and toileting   PARTICIPATION LIMITATIONS: community activity   PERSONAL FACTORS: Age and 1-2 comorbidities: Chronic systolic heart failure; Myocardial infarction, Appendectomy; cholecystectomy; Inguinal hernia repair bil. 04/09/22  are also affecting patient's functional outcome.    REHAB POTENTIAL: Excellent   CLINICAL DECISION MAKING: Evolving/moderate complexity   EVALUATION COMPLEXITY: Moderate     GOALS: Goals reviewed with patient? Yes   SHORT TERM GOALS: Target date: 11/15/2021   Patient able to fully contract his rectum with isolation and lift the pelvic floor.  Baseline: Goal status: Met 11/15/22   2.  Patient understands how to contract his lower and upper abdominals together.  Baseline:  Goal status: INITIAL   3.  Patient is able to push the therapist finger out without contracting the anus.  Baseline:  Goal status: INITIAL     LONG TERM GOALS: Target date: 01/09/2022   Patient independent with advanced HEP for core and pelvic floor to reduce fecal leakage.  Baseline:  Goal status: INITIAL   2.  Pelvic floor strength is >/= 3/5 without contracting his gluteal reduce leakage.  Baseline:  Goal status: INITIAL   3.  Patient does not need to wear a pad due to reduction of fecal leakage.  Baseline:  Goal status: INITIAL   4.  Patient has increased sensation and awareness of the stool in his rectum and it coming out of the rectum due to increased strength of the anal muscles.  Baseline:  Goal status: INITIAL     PLAN:   PT FREQUENCY: 1x/week   PT DURATION: 12 weeks   PLANNED INTERVENTIONS: Therapeutic exercises, Therapeutic activity, Neuromuscular re-education, Balance training, Gait training,  Patient/Family education, Joint mobilization, Dry Needling, Spinal mobilization, Biofeedback, and Manual therapy   PLAN FOR NEXT SESSION:  See if pt was able to get on his reformer at home and practice.    Myrene Galas, PTA 11/18/22 12:29 PM

## 2022-11-20 ENCOUNTER — Encounter: Payer: Self-pay | Admitting: Physical Therapy

## 2022-11-20 ENCOUNTER — Ambulatory Visit: Payer: Medicare Other | Admitting: Physical Therapy

## 2022-11-20 DIAGNOSIS — R278 Other lack of coordination: Secondary | ICD-10-CM | POA: Diagnosis not present

## 2022-11-20 DIAGNOSIS — M6281 Muscle weakness (generalized): Secondary | ICD-10-CM

## 2022-11-20 NOTE — Therapy (Addendum)
OUTPATIENT PHYSICAL THERAPY TREATMENT NOTE   Patient Name: Isaac [name] MRN: [id number] DOB:02-25-1951, 72 y.o., male Today's Date: 11/20/2022  PCP: Josetta Huddle, MD   REFERRING PROVIDER: Arta Silence, MD     END OF SESSION:   PT End of Session - 11/20/22 1019     Visit Number 4    Date for PT Re-Evaluation 01/10/23    Authorization Type [employer] medicare    Authorization - Visit Number 4    Progress Note Due on Visit 10    PT Start Time [id number]    PT Stop Time 1055    PT Time Calculation (min) 40 min    Activity Tolerance Patient tolerated treatment well    Behavior During Therapy WFL for tasks assessed/performed             Past Medical History:  Diagnosis Date   CERVICAL SPINE DISORDER, NOS    Chronic systolic (congestive) heart failure (HCC)    EF 40%   Coronary artery disease    a. 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, NOS    Dysrhythmia    PVC's occasionally per pt   Hypertension    METATARSALGIA    Myocardial infarction ([place]) 2010   OSTEOARTHROSIS, LOCAL, SCND, HAND    Paroxysmal atrial fibrillation ([place])    Past Surgical History:  Procedure Laterality Date   ANAL RECTAL MANOMETRY Bilateral 08/21/2022   Procedure: ANO RECTAL MANOMETRY;  Surgeon: Arta Silence, MD;  Location: [facility];  Service: Gastroenterology;  Laterality: Bilateral;   APPENDECTOMY     CHOLECYSTECTOMY     CORONARY ARTERY BYPASS GRAFT  10/11/2009   ELECTROPHYSIOLOGIC STUDY N/A 09/17/2016   Procedure: Atrial Fibrillation Ablation;  Surgeon: Thompson Grayer, MD;  Location: [facility];  Service: Cardiovascular;  Laterality: N/A;   INGUINAL HERNIA REPAIR Bilateral 04/09/2022   Procedure: OPEN BILATERAL INGUINAL HERNIA REPAIRS;  Surgeon: Donnie Mesa, MD;  Location: [facility];  Service: General;  Laterality: Bilateral;   INSERTION OF MESH Bilateral 04/09/2022   Procedure: INSERTION OF MESH;  Surgeon: Donnie Mesa, MD;  Location: [place];  Service: General;  Laterality: Bilateral;    MULTIPLE TOOTH EXTRACTIONS  2000   pt states he also went under anesthesia for another dental surgery 2005 (he states they had to "break his jaw" for an overbite that he had)   [employer]   Patient Active Problem List   Diagnosis Date Noted   PVC's (premature ventricular contractions) 01/14/2022   A-fib ([place]) 09/17/2016   Paroxysmal atrial fibrillation ([place]) 12/26/2015   Visit for monitoring Tikosyn therapy 12/26/2015   Chronic anticoagulation 12/22/2015   Hyperlipidemia LDL goal <70 09/28/2013   Chronic combined systolic and diastolic CHF, NYHA class 2 ([place]) 09/28/2013    Class: Chronic   Chest pain, unspecified 05/21/2012   Coronary artery disease involving coronary bypass graft of native heart with angina pectoris ([place]) 01/06/2012   MI (myocardial infarction) ([place]) 01/06/2012   S/P CABG (coronary artery bypass graft) 01/06/2012   Advice or immunization for travel 01/06/2012   DEGENERATIVE JOINT DISEASE, HANDS 08/23/2009   ELEVATED BP READING WITHOUT DX HYPERTENSION 04/08/2008   PLANTAR WART 09/22/2007   METATARSALGIA 09/22/2007   ECZEMA 06/30/2007   OSTEOARTHROSIS, LOCAL, SCND, HAND 06/30/2007   SKIN CANCER 01/08/2007   TREMOR, ESSENTIAL/FAMILIAL 01/08/2007   DIVERTICULITIS OF COLON, NOS 01/08/2007   [place] SYNDROME, NO MYELOPATHY, NOS 01/08/2007   CERVICAL SPINE DISORDER, NOS 01/08/2007   REFERRING DIAG: R15.9 (ICD-10-CM) -  Full incontinence of feces    THERAPY DIAG:  Muscle weakness (generalized)   Other lack of coordination   Rationale for Evaluation and Treatment: Rehabilitation   ONSET DATE: 11/2018   SUBJECTIVE:                                                                                                                                                                                            SUBJECTIVE STATEMENT: I bike 30-45 minutes 3 times per week. I had 2 squirts of stool come out last Monday. I have not used the reformer with the  straps but showed my wife the exercises.    PAIN:  Are you having pain? No     PRECAUTIONS: None   WEIGHT BEARING RESTRICTIONS: No   FALLS:  Has patient fallen in last 6 months? No   LIVING ENVIRONMENT: Lives with: lives with their family   OCCUPATION: retired, walk 3-4 times per week, ride exercise 2x per week   PLOF: Independent   PATIENT GOALS: reduce leakage   PERTINENT HISTORY:  Chronic systolic heart failure; Myocardial infarction, Appendectomy; cholecystectomy; Inguinal hernia repair bil. 04/09/22   BOWEL MOVEMENT: Pain with bowel movement: No Type of bowel movement:Type (B[place] Stool Scale) Type 2 or 3, Frequency 1 time per day in morning, and Strain No Fully empty rectum: Yes: does not always feel all of the stool come out Leakage: Yes: prior to [employer] every 7-10 days but now with [employer] 1 time per month and is all liquid and is tiny drops Pads: Yes: 1 shield per day    URINATION: Pain with urination: No Fully empty bladder: Yes:   Stream: Weak Urgency: Yes:   Frequency: normal,  Leakage:  on occasion , running water   I OBJECTIVE:    DIAGNOSTIC FINDINGS:  Manometry test indicated Decreased anal sphincter resting pressure and squeeze pressure Equivocal findings for dyssynergic defecation with minimally decreased rectal sensation     COGNITION: Overall cognitive status: Within functional limits for tasks assessed                          SENSATION: Light touch: Appears intact Proprioception: Appears intact       POSTURE: No Significant postural limitations   PELVIC ALIGNMENT:   LUMBARAROM/PROM:   A/PROM A/PROM  eval  Flexion full  Extension Decreased by 25%  Right lateral flexion Decreased by 25%  Left lateral flexion Decreased by 25%  Right rotation Decreased by 25%  Left rotation Decreased by 25%   (Blank rows = not tested)     LOWER EXTREMITY MMT:  MMT Right eval Left eval  Hip flexion 5/5 5/5  Hip extension 4/5 4/5   Hip abduction 4/5 4/5    PALPATION:   General  has difficulty with contracting the upper abdominals                 External Perineal Exam intact                             Internal Pelvic Floor tightness in the anococcygeal ligament and puborectalis; the puborectalis does not come forward; difficulty pushing therapist finger out of the canal and instead contracted   Patient confirms identification and approves PT to assess internal pelvic floor and treatment Yes   PELVIC MMT:   MMT eval 11/15/22  Internal Anal Sphincter 2/5 3/5 holding for 20 sec 2x, 8 sec 5x  External Anal Sphincter 2/5 3/5 holding for 20 sec 2x, 8 sec 5x  Puborectalis 2/5 3/5 holding for 20 sec 2x, 8 sec 5x  (Blank rows = not tested)         TONE: Increased    TODAY'S TREATMENT:   11/20/2022 Manual: Soft tissue mobilization: Scar tissue mobilization: Myofascial release: Spinal mobilization: Internal pelvic floor techniques: Dry needling: Neuromuscular re-education: Core retraining: Core facilitation: Form correction: Pelvic floor contraction training: Using the pelvic floor EMG with electrodes placed on the outside of the anus by the therapist Resting tone 6 uv Hold for 5 sec initially at 8 uv then went to 13 uv Contracting for 10 sec at 13 uv for 5 times Quick contractions 24 uv  Steps of the pelvic floor contraction with 2 steps up and 2 steps down.  Down training: Exercises: Stretches/mobility: Strengthening: Nustep level 4 for 5 minutes while assessing patient.  Therapeutic activities: Functional strengthening activities: Self-care:       11/18/21: Nustep for warm up; new model L5 5 min Diaphragmatic Pilates breath: 5x concentrating on more breath in and more breath out Added TA 6x, TA and back of pelvic floor contraction 6x concurrent Added leg lift with above; 6x Bil Educated pt in Pilates Reformer exercises he can do at home with his wife who instructs.(See pt instructions)  PTA  emphasized his intention is his "front and back" while working on Paediatric nurse.      11/15/2022 Manual: Internal pelvic floor techniques:No emotional/communication barriers or cognitive limitation. Patient is motivated to learn. Patient understands and agrees with treatment goals and plan. PT explains patient will be examined in standing, sitting, and lying down to see how their muscles and joints work. When they are ready, they will be asked to remove their underwear so PT can examine their perineum. The patient is also given the option of providing their own chaperone as one is not provided in our facility. The patient also has the right and is explained the right to defer or refuse any part of the evaluation or treatment including the internal exam. With the patient's consent, PT will use one gloved finger to gently assess the muscles of the pelvic floor, seeing how well it contracts and relaxes and if there is muscle symmetry. After, the patient will get dressed and PT and patient will discuss exam findings and plan of care. PT and patient discuss plan of care, schedule, attendance policy and HEP activities.  Manual work to the puborectalis and anococcygeal ligament Neuromuscular re-education: Core facilitation: Supine transverse abdominus  holding 5 seconds with ball between knees Hip flexion isometrics  holding 5 second with abdominal contraction Sitting lean back with abdominal contraction holding 5 sec 10x Pelvic floor contraction training: Therapist finger in the anal canal working on contraction for 20 seconds and 8 seconds Down training: Diaphragmatic breathing to lengthen the pelvic floor in sidely                                                                                                                               PATIENT EDUCATION:  11/15/22 Education details: Access Code: [email or web address] Person educated: Patient Education method: Explanation, Demonstration, Tactile cues, Verbal cues,  and Handouts Education comprehension: verbalized understanding, returned demonstration, verbal cues required, tactile cues required, and needs further education   HOME EXERCISE PROGRAM: 11/20/2022  Access Code: [email or web address] URL: [email or web address] Date: 11/20/2022 Prepared by: Earlie Counts  Exercises - Hooklying Transversus Abdominis Palpation  - 1 x daily - 7 x weekly - 1 sets - 10 reps - Hooklying Isometric Hip Flexion  - 1 x daily - 7 x weekly - 2 sets - 5 reps - Seated Lean Back on Swiss Ball  - 1 x daily - 7 x weekly - 1 sets - 10 reps - Seated Pelvic Floor Contraction  - 3 x daily - 7 x weekly - Supine Diaphragmatic Breathing  - 2 x daily - 7 x weekly - 1 sets - 10 reps - Seated Quick Flick Pelvic Floor Contractions  - 2 x daily - 7 x weekly - 1 sets - 10 reps - Seated Pelvic Floor Muscles Isometrics on Towel Roll  - 2 x daily - 7 x weekly - 1 sets - 10 reps - 10 sec hold - Seated Pelvic Floor Elevators  - 2 x daily - 7 x weekly - 1 sets - 10 reps ASSESSMENT:   CLINICAL IMPRESSION: Patient does get the urge to have a bowel movement. Patient will leak loose  stool or phlem. It can happen with movement. Patient is able to isolate the pelvic floor in sidely and hold 10 sec before fatigue. He is not able to isolate the pelvic floor contraction in standing and this is where he will leak the stool. Patient will benefit from skilled therapy to improve pelvic floor strength in standing to reduce leakage. .      OBJECTIVE IMPAIRMENTS: decreased coordination, decreased endurance, decreased strength, and impaired sensation.    ACTIVITY LIMITATIONS: continence and toileting   PARTICIPATION LIMITATIONS: community activity   PERSONAL FACTORS: Age and 1-2 comorbidities: Chronic systolic heart failure; Myocardial infarction, Appendectomy; cholecystectomy; Inguinal hernia repair bil. 04/09/22  are also affecting patient's functional outcome.    REHAB POTENTIAL: Excellent   CLINICAL  DECISION MAKING: Evolving/moderate complexity   EVALUATION COMPLEXITY: Moderate     GOALS: Goals reviewed with patient? Yes   SHORT TERM GOALS: Target date: 11/15/2021   Patient able to fully contract his rectum with isolation and lift the pelvic floor.  Baseline: Goal status: Met 11/15/22   2.  Patient  understands how to contract his lower and upper abdominals together.  Baseline:  Goal status: Met 11/20/22   3.  Patient is able to push the therapist finger out without contracting the anus.  Baseline:  Goal status: INITIAL     LONG TERM GOALS: Target date: 01/09/2022   Patient independent with advanced HEP for core and pelvic floor to reduce fecal leakage.  Baseline:  Goal status: INITIAL   2.  Pelvic floor strength is >/= 3/5 without contracting his gluteal reduce leakage.  Baseline:  Goal status: INITIAL   3.  Patient does not need to wear a pad due to reduction of fecal leakage.  Baseline:  Goal status: INITIAL   4.  Patient has increased sensation and awareness of the stool in his rectum and it coming out of the rectum due to increased strength of the anal muscles.  Baseline:  Goal status: INITIAL     PLAN:   PT FREQUENCY: 1x/week   PT DURATION: 12 weeks   PLANNED INTERVENTIONS: Therapeutic exercises, Therapeutic activity, Neuromuscular re-education, Balance training, Gait training, Patient/Family education, Joint mobilization, Dry Needling, Spinal mobilization, Biofeedback, and Manual therapy   PLAN FOR NEXT SESSION:  work with pelvic EMG in standing   Earlie Counts, PT 11/20/22 11:02 AM   PHYSICAL THERAPY DISCHARGE SUMMARY  Visits from Start of Care: 4  Current functional level related to goals / functional outcomes: Patient had to stop therapy due to having eye surgery. He has not called to schedule any further visits.    Remaining deficits: See above.    Education / Equipment: HEP   Patient agrees to discharge. Patient goals were not met. Patient is  being discharged due to not returning since the last visit. Thank you for the referral. Earlie Counts, PT 01/09/23 2:53 PM

## 2022-11-21 ENCOUNTER — Other Ambulatory Visit (HOSPITAL_COMMUNITY): Payer: Self-pay

## 2022-11-21 ENCOUNTER — Other Ambulatory Visit: Payer: Self-pay

## 2022-11-22 DIAGNOSIS — H43813 Vitreous degeneration, bilateral: Secondary | ICD-10-CM | POA: Diagnosis not present

## 2022-11-22 DIAGNOSIS — H2513 Age-related nuclear cataract, bilateral: Secondary | ICD-10-CM | POA: Diagnosis not present

## 2022-11-22 DIAGNOSIS — H33002 Unspecified retinal detachment with retinal break, left eye: Secondary | ICD-10-CM | POA: Diagnosis not present

## 2022-11-22 DIAGNOSIS — H33052 Total retinal detachment, left eye: Secondary | ICD-10-CM | POA: Diagnosis not present

## 2022-11-25 ENCOUNTER — Ambulatory Visit: Payer: Medicare Other | Admitting: Physical Therapy

## 2022-11-25 ENCOUNTER — Other Ambulatory Visit (HOSPITAL_COMMUNITY): Payer: Self-pay

## 2022-11-25 MED ORDER — OFLOXACIN 0.3 % OP SOLN
1.0000 [drp] | Freq: Four times a day (QID) | OPHTHALMIC | 5 refills | Status: DC
  Filled 2022-11-25: qty 5, 25d supply, fill #0

## 2022-11-25 MED ORDER — PREDNISOLONE ACETATE 1 % OP SUSP
1.0000 [drp] | Freq: Four times a day (QID) | OPHTHALMIC | 5 refills | Status: DC
Start: 2022-11-22 — End: 2023-02-25
  Filled 2022-11-25: qty 10, 50d supply, fill #0

## 2022-11-26 DIAGNOSIS — H33022 Retinal detachment with multiple breaks, left eye: Secondary | ICD-10-CM | POA: Diagnosis not present

## 2022-11-27 ENCOUNTER — Other Ambulatory Visit (HOSPITAL_COMMUNITY): Payer: Self-pay

## 2022-11-27 DIAGNOSIS — Z9889 Other specified postprocedural states: Secondary | ICD-10-CM | POA: Diagnosis not present

## 2022-11-27 DIAGNOSIS — H33002 Unspecified retinal detachment with retinal break, left eye: Secondary | ICD-10-CM | POA: Diagnosis not present

## 2022-11-27 DIAGNOSIS — H2512 Age-related nuclear cataract, left eye: Secondary | ICD-10-CM | POA: Diagnosis not present

## 2022-11-27 MED ORDER — MAXITROL 3.5-10000-0.1 OP OINT
TOPICAL_OINTMENT | Freq: Two times a day (BID) | OPHTHALMIC | 0 refills | Status: DC
  Filled 2022-11-27: qty 3.5, 7d supply, fill #0

## 2022-11-27 MED ORDER — METHYLPREDNISOLONE 4 MG PO TBPK
ORAL_TABLET | ORAL | 0 refills | Status: DC
  Filled 2022-11-27: qty 21, 6d supply, fill #0

## 2022-12-02 ENCOUNTER — Other Ambulatory Visit (HOSPITAL_COMMUNITY): Payer: Self-pay

## 2022-12-02 ENCOUNTER — Encounter: Payer: Medicare Other | Admitting: Physical Therapy

## 2022-12-02 ENCOUNTER — Other Ambulatory Visit: Payer: Self-pay | Admitting: Interventional Cardiology

## 2022-12-02 DIAGNOSIS — I480 Paroxysmal atrial fibrillation: Secondary | ICD-10-CM

## 2022-12-02 MED ORDER — FLUCONAZOLE 150 MG PO TABS
150.0000 mg | ORAL_TABLET | ORAL | 0 refills | Status: DC
  Filled 2022-12-02: qty 4, 28d supply, fill #0

## 2022-12-03 ENCOUNTER — Other Ambulatory Visit (HOSPITAL_COMMUNITY): Payer: Self-pay

## 2022-12-03 ENCOUNTER — Telehealth: Payer: Self-pay | Admitting: Cardiology

## 2022-12-03 DIAGNOSIS — H33002 Unspecified retinal detachment with retinal break, left eye: Secondary | ICD-10-CM | POA: Diagnosis not present

## 2022-12-03 DIAGNOSIS — Z9889 Other specified postprocedural states: Secondary | ICD-10-CM | POA: Diagnosis not present

## 2022-12-03 MED ORDER — APIXABAN 5 MG PO TABS
5.0000 mg | ORAL_TABLET | Freq: Two times a day (BID) | ORAL | 1 refills | Status: DC
  Filled 2022-12-03: qty 180, 90d supply, fill #0
  Filled 2023-02-27: qty 180, 90d supply, fill #1

## 2022-12-03 NOTE — Telephone Encounter (Signed)
Spoke with pt who is reporting he has had on Farxiga for appr 1 yr.  In October he developed a UTI/yeast infection.  His PCP has been treating him but it is not clearing up.  PCP advised pt stop Farxiga x 1 week (or longer) and take a stronger antifungal.  Advised pt follow PCP orders.  I will forward this information to Dr Marlou Porch for his review and any further recommendations/orders.

## 2022-12-03 NOTE — Telephone Encounter (Signed)
Prescription refill request for Eliquis received. Indication: Afib  Last office visit: 03/05/22 Tamala Julian)  Scr: 1.01 (04/01/22)  Age: 72 Weight: 67.4kg  Appropriate dose and refill sent to requested pharmacy.

## 2022-12-03 NOTE — Telephone Encounter (Signed)
Left message to call back to discuss his concerns.

## 2022-12-03 NOTE — Telephone Encounter (Signed)
Pt c/o medication issue:  1. Name of Medication:   dapagliflozin propanediol (FARXIGA) 10 MG TABS tablet    2. How are you currently taking this medication (dosage and times per day)?   Take 1 tablet (10 mg total) by mouth daily before breakfast.    3. Are you having a reaction (difficulty breathing--STAT)? No  4. What is your medication issue? Pt would like a callback from nurse regarding dosage for medication. Please advise

## 2022-12-06 NOTE — Telephone Encounter (Signed)
  Agree with stopping [place].  Candee Furbish, MD

## 2022-12-09 ENCOUNTER — Encounter: Payer: Medicare Other | Admitting: Physical Therapy

## 2022-12-13 DIAGNOSIS — Z7901 Long term (current) use of anticoagulants: Secondary | ICD-10-CM | POA: Diagnosis not present

## 2022-12-13 DIAGNOSIS — H3320 Serous retinal detachment, unspecified eye: Secondary | ICD-10-CM | POA: Diagnosis not present

## 2022-12-16 ENCOUNTER — Encounter: Payer: Medicare Other | Admitting: Physical Therapy

## 2022-12-16 DIAGNOSIS — L570 Actinic keratosis: Secondary | ICD-10-CM | POA: Diagnosis not present

## 2022-12-16 DIAGNOSIS — D225 Melanocytic nevi of trunk: Secondary | ICD-10-CM | POA: Diagnosis not present

## 2022-12-16 DIAGNOSIS — L820 Inflamed seborrheic keratosis: Secondary | ICD-10-CM | POA: Diagnosis not present

## 2022-12-16 DIAGNOSIS — L821 Other seborrheic keratosis: Secondary | ICD-10-CM | POA: Diagnosis not present

## 2022-12-16 DIAGNOSIS — Z85828 Personal history of other malignant neoplasm of skin: Secondary | ICD-10-CM | POA: Diagnosis not present

## 2022-12-18 ENCOUNTER — Ambulatory Visit: Payer: Medicare Other | Admitting: Physical Therapy

## 2022-12-19 DIAGNOSIS — H43811 Vitreous degeneration, right eye: Secondary | ICD-10-CM | POA: Diagnosis not present

## 2022-12-20 ENCOUNTER — Other Ambulatory Visit (HOSPITAL_COMMUNITY): Payer: Self-pay

## 2022-12-25 ENCOUNTER — Other Ambulatory Visit (HOSPITAL_COMMUNITY): Payer: Self-pay

## 2022-12-26 ENCOUNTER — Other Ambulatory Visit: Payer: Self-pay

## 2023-01-16 DIAGNOSIS — H43811 Vitreous degeneration, right eye: Secondary | ICD-10-CM | POA: Diagnosis not present

## 2023-01-16 DIAGNOSIS — H35373 Puckering of macula, bilateral: Secondary | ICD-10-CM | POA: Diagnosis not present

## 2023-02-10 ENCOUNTER — Other Ambulatory Visit: Payer: Self-pay

## 2023-02-11 ENCOUNTER — Other Ambulatory Visit (HOSPITAL_COMMUNITY): Payer: Self-pay

## 2023-02-11 ENCOUNTER — Other Ambulatory Visit: Payer: Self-pay

## 2023-02-11 ENCOUNTER — Other Ambulatory Visit: Payer: Self-pay | Admitting: Cardiology

## 2023-02-11 MED ORDER — ROSUVASTATIN CALCIUM 5 MG PO TABS
5.0000 mg | ORAL_TABLET | Freq: Every day | ORAL | 0 refills | Status: DC
  Filled 2023-02-11 – 2023-02-12 (×2): qty 90, 90d supply, fill #0

## 2023-02-12 ENCOUNTER — Other Ambulatory Visit (HOSPITAL_COMMUNITY): Payer: Self-pay

## 2023-02-12 ENCOUNTER — Other Ambulatory Visit: Payer: Self-pay

## 2023-02-17 ENCOUNTER — Encounter: Payer: Self-pay | Admitting: Cardiology

## 2023-02-17 ENCOUNTER — Other Ambulatory Visit (HOSPITAL_COMMUNITY): Payer: Self-pay

## 2023-02-17 MED ORDER — PEG 3350-KCL-NA BICARB-NACL 420 G PO SOLR
ORAL | 0 refills | Status: DC
  Filled 2023-02-17: qty 4000, 2d supply, fill #0

## 2023-02-18 DIAGNOSIS — H524 Presbyopia: Secondary | ICD-10-CM | POA: Diagnosis not present

## 2023-02-18 DIAGNOSIS — H5213 Myopia, bilateral: Secondary | ICD-10-CM | POA: Diagnosis not present

## 2023-02-18 DIAGNOSIS — H33052 Total retinal detachment, left eye: Secondary | ICD-10-CM | POA: Diagnosis not present

## 2023-02-18 DIAGNOSIS — H25813 Combined forms of age-related cataract, bilateral: Secondary | ICD-10-CM | POA: Diagnosis not present

## 2023-02-19 ENCOUNTER — Telehealth: Payer: Self-pay | Admitting: *Deleted

## 2023-02-19 NOTE — Telephone Encounter (Signed)
   Pre-operative Risk Assessment    Patient Name: Isaac [name]  DOB: Feb 22, 1951 MRN: [id number]      Request for Surgical Clearance    Procedure:   COLONOSCOPY ; H/O COLON POLYPS  Date of Surgery:  Clearance 03/04/23                                 Surgeon:  DR. Dulce Sellar Surgeon's Group or Practice Name:  [facility] Phone number:  [phone] Fax number:  [phone]   Type of Clearance Requested:   - Medical  - Pharmacy:  Hold Apixaban (Eliquis)     Type of Anesthesia:   PROPOFOL    Additional requests/questions:    Elpidio Anis   02/19/2023, 2:05 PM

## 2023-02-19 NOTE — Telephone Encounter (Signed)
Tried to reach the pt to schedule a tele pre op appt, though no answer.

## 2023-02-19 NOTE — Telephone Encounter (Signed)
Patient with diagnosis of afib on Eliquis for anticoagulation.    Procedure: colonoscopy Date of procedure: 03/04/23  CHA2DS2-VASc Score = 3  This indicates a 3.2% annual risk of stroke. The patient's score is based upon: CHF History: 1 HTN History: 0 Diabetes History: 0 Stroke History: 0 Vascular Disease History: 1 Age Score: 1 Gender Score: 0  CrCl 35mL/min Platelet count 178K  Per office protocol, patient can hold Eliquis for 1-2 days prior to procedure.    **This guidance is not considered finalized until pre-operative APP has relayed final recommendations.**

## 2023-02-19 NOTE — Telephone Encounter (Signed)
   Name: Isaac [name]  DOB: Jan 16, 1951  MRN: [id number]  Primary Cardiologist: Lesleigh Noe, MD (Inactive)   Preoperative team, please contact this patient and set up a phone call appointment for further preoperative risk assessment. Please obtain consent and complete medication review. Thank you for your help.  I confirm that guidance regarding antiplatelet and oral anticoagulation therapy has been completed and, if necessary, noted below.  Per Pharm D: Patient with diagnosis of afib on Eliquis for anticoagulation.     Procedure: colonoscopy Date of procedure: 03/04/23   CHA2DS2-VASc Score = 3  This indicates a 3.2% annual risk of stroke. The patient's score is based upon: CHF History: 1 HTN History: 0 Diabetes History: 0 Stroke History: 0 Vascular Disease History: 1 Age Score: 1 Gender Score: 0   CrCl 62mL/min Platelet count 178K   Per office protocol, patient can hold Eliquis for 1-2 days prior to procedure.     Carlos Levering, NP 02/19/2023, 5:05 PM [facility]

## 2023-02-20 ENCOUNTER — Telehealth: Payer: Self-pay | Admitting: *Deleted

## 2023-02-20 NOTE — Telephone Encounter (Signed)
S/w the pt and he has been scheduled for tele pre op appt 02/27/23 @ 9 am. Med rec and consent are done.     Patient Consent for Virtual Visit        Isaac [name] has provided verbal consent on 02/20/2023 for a virtual visit (video or telephone).   CONSENT FOR VIRTUAL VISIT FOR:  Isaac [name]  By participating in this virtual visit I agree to the following:  I hereby voluntarily request, consent and authorize [facility] and its employed or contracted physicians, physician assistants, nurse practitioners or other licensed health care professionals (the Practitioner), to provide me with telemedicine health care services (the "Services") as deemed necessary by the treating Practitioner. I acknowledge and consent to receive the Services by the Practitioner via telemedicine. I understand that the telemedicine visit will involve communicating with the Practitioner through live audiovisual communication technology and the disclosure of certain medical information by electronic transmission. I acknowledge that I have been given the opportunity to request an in-person assessment or other available alternative prior to the telemedicine visit and am voluntarily participating in the telemedicine visit.  I understand that I have the right to withhold or withdraw my consent to the use of telemedicine in the course of my care at any time, without affecting my right to future care or treatment, and that the Practitioner or I may terminate the telemedicine visit at any time. I understand that I have the right to inspect all information obtained and/or recorded in the course of the telemedicine visit and may receive copies of available information for a reasonable fee.  I understand that some of the potential risks of receiving the Services via telemedicine include:  Delay or interruption in medical evaluation due to technological equipment failure or disruption; Information transmitted may not be  sufficient (e.g. poor resolution of images) to allow for appropriate medical decision making by the Practitioner; and/or  In rare instances, security protocols could fail, causing a breach of personal health information.  Furthermore, I acknowledge that it is my responsibility to provide information about my medical history, conditions and care that is complete and accurate to the best of my ability. I acknowledge that Practitioner's advice, recommendations, and/or decision may be based on factors not within their control, such as incomplete or inaccurate data provided by me or distortions of diagnostic images or specimens that may result from electronic transmissions. I understand that the practice of medicine is not an exact science and that Practitioner makes no warranties or guarantees regarding treatment outcomes. I acknowledge that a copy of this consent can be made available to me via my patient portal [facility] MyChart), or I can request a printed copy by calling the office of [facility].    I understand that my insurance will be billed for this visit.   I have read or had this consent read to me. I understand the contents of this consent, which adequately explains the benefits and risks of the Services being provided via telemedicine.  I have been provided ample opportunity to ask questions regarding this consent and the Services and have had my questions answered to my satisfaction. I give my informed consent for the services to be provided through the use of telemedicine in my medical care

## 2023-02-20 NOTE — Telephone Encounter (Signed)
S/w the pt and he has been scheduled for tele pre op appt 02/27/23 @ 9 am. Med rec and consent are done.

## 2023-02-24 NOTE — Progress Notes (Unsigned)
Virtual Visit via Telephone Note   Because of Isaac [name]'s co-morbid illnesses, he is at least at moderate risk for complications without adequate follow up.  This format is felt to be most appropriate for this patient at this time.  The patient did not have access to video technology/had technical difficulties with video requiring transitioning to audio format only (telephone).  All issues noted in this document were discussed and addressed.  No physical exam could be performed with this format.  Please refer to the patient's chart for his consent to telehealth for [facility].  Evaluation Performed:  Preoperative cardiovascular risk assessment _____________   Date:  02/27/2023   Patient ID:  Isaac [name], Isaac [name] 02/22/1951, MRN [id number] Patient Location:  Home Provider location:   Office  Primary Care Provider:  Marden Noble, MD Primary Cardiologist:  Lesleigh Noe, MD (Inactive)  Chief Complaint / Patient Profile   72 y.o. y/o male with a h/o CAD s/p CABG 2010, PAF s/p ablation 2017 (failed tikosyn), atretic LAD and non-ischemic nuclear study 2017, chronic combined CHF, chronic anticoagulation, frequent PVCs who is pending Colonoscopy and presents today for telephonic preoperative cardiovascular risk assessment.  History of Present Illness    Isaac [name] is a 72 y.o. male who presents via audio/video conferencing for a telehealth visit today.  Pt was last seen in cardiology clinic on 02/25/23 by Dr. Ladona Ridgel.  At that time Isaac [name] was doing well.  The patient is now pending procedure as outlined above. Since his last visit, he denies chest pain, shortness of breath, lower extremity edema, fatigue, palpitations, melena, hematuria, hemoptysis, diaphoresis, weakness, presyncope, syncope, orthopnea, and PND. He remains active with cycling, walking, and yard work without concerning cardiac symptoms.   Past Medical History    Past Medical History:  Diagnosis  Date   CERVICAL SPINE DISORDER, NOS    Chronic systolic (congestive) heart failure    EF 40%   Coronary artery disease    a. 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, NOS    Dysrhythmia    PVC's occasionally per pt   Hypertension    METATARSALGIA    Myocardial infarction 2010   OSTEOARTHROSIS, LOCAL, SCND, HAND    Paroxysmal atrial fibrillation    Past Surgical History:  Procedure Laterality Date   ANAL RECTAL MANOMETRY Bilateral 08/21/2022   Procedure: ANO RECTAL MANOMETRY;  Surgeon: Willis Modena, MD;  Location: [facility];  Service: Gastroenterology;  Laterality: Bilateral;   APPENDECTOMY     CHOLECYSTECTOMY     CORONARY ARTERY BYPASS GRAFT  10/11/2009   ELECTROPHYSIOLOGIC STUDY N/A 09/17/2016   Procedure: Atrial Fibrillation Ablation;  Surgeon: Hillis Range, MD;  Location: [facility] INVASIVE CV LAB;  Service: Cardiovascular;  Laterality: N/A;   INGUINAL HERNIA REPAIR Bilateral 04/09/2022   Procedure: OPEN BILATERAL INGUINAL HERNIA REPAIRS;  Surgeon: Manus Rudd, MD;  Location: [facility] OR;  Service: General;  Laterality: Bilateral;   INSERTION OF MESH Bilateral 04/09/2022   Procedure: INSERTION OF MESH;  Surgeon: Manus Rudd, MD;  Location: [facility] OR;  Service: General;  Laterality: Bilateral;   MULTIPLE TOOTH EXTRACTIONS  2000   pt states he also went under anesthesia for another dental surgery 2005 (he states they had to "break his jaw" for an overbite that he had)   WISDOM TOOTH EXTRACTION  1968    Allergies  No Known Allergies  Home Medications    Prior to Admission medications   Medication Sig Start Date  End Date Taking? Authorizing Provider  acetaminophen (TYLENOL) 325 MG tablet Take 325 mg by mouth every 6 (six) hours as needed for mild pain.    [provider]  apixaban (ELIQUIS) 5 MG TABS tablet Take 1 tablet (5 mg total) by mouth 2 (two) times daily. 12/03/22   Jake Bathe, MD  cetirizine (ZYRTEC) 10 MG tablet Take 10 mg by mouth daily.    [provider]  Cholecalciferol 25 MCG (1000 UT) tablet Take 1,000 Units by mouth in the morning, at noon, and at bedtime.    [provider]  clotrimazole (LOTRIMIN) 1 % cream Apply 1 Application topically 2 (two) times daily for 21 days 09/12/22     COVID-19 mRNA vaccine 2023-2024 (COMIRNATY) syringe Inject into the muscle. 08/28/22     dapagliflozin propanediol (FARXIGA) 10 MG TABS tablet Take 1 tablet (10 mg total) by mouth daily before breakfast. Patient not taking: Reported on 02/20/2023 03/05/22   Lyn Records, MD  fluconazole (DIFLUCAN) 150 MG tablet Take 1 tablet (150 mg total) by mouth once a week. Patient not taking: Reported on 02/20/2023 12/02/22     influenza vaccine adjuvanted (FLUAD) 0.5 ML injection Inject into the muscle. 08/28/22     losartan (COZAAR) 25 MG tablet Take 1 tablet (25 mg total) by mouth daily. 04/02/22   Lyn Records, MD  METAMUCIL FIBER PO Take 1 Scoop by mouth in the morning and at bedtime.    [provider]  methylPREDNISolone (MEDROL) 4 MG TBPK tablet Take As Directed Patient not taking: Reported on 02/20/2023 11/27/22     metoprolol succinate (TOPROL XL) 25 MG 24 hr tablet Take 1 tablet (25 mg total) by mouth daily. 03/05/22   Lyn Records, MD  neomycin-polymyxin-dexameth (MAXITROL) 0.1 % OINT Apply a pea sized amount into the left eye 2 (two) times daily. Patient not taking: Reported on 02/20/2023 11/27/22     ofloxacin (OCUFLOX) 0.3 % ophthalmic solution Place 1 drop into the left eye 4 (four) times daily. Begin one day prior to surgery and continue as directed Patient not taking: Reported on 02/20/2023 11/22/22     oxyCODONE (OXY IR/ROXICODONE) 5 MG immediate release tablet Take 1 tablet (5 mg total) by mouth every 6 (six) hours as needed for severe pain. Patient not taking: Reported on 02/20/2023 04/09/22   Manus Rudd, MD  polyethylene glycol-electrolytes (NULYTELY) 420 g solution Use as directed Patient not taking: Reported on 02/20/2023  01/28/23   Willis Modena, MD  prednisoLONE acetate (PRED FORTE) 1 % ophthalmic suspension Place 1 drop into the left eye 4 (four) times daily. Begin 1 day after surgery and continue as directed. Patient not taking: Reported on 02/20/2023 11/22/22     Probiotic Product (ALIGN) 4 MG CAPS Take 4 mg by mouth daily.    [provider]  rosuvastatin (CRESTOR) 5 MG tablet Take 1 tablet (5 mg total) by mouth daily. 02/11/23   Jake Bathe, MD  tamsulosin (FLOMAX) 0.4 MG CAPS capsule Take 1 capsule (0.4 mg total) by mouth once daily 30 minutes after the same meal. 07/25/22       Physical Exam    Vital Signs:  Isaac [name] does not have vital signs available for review today.  Given telephonic nature of communication, physical exam is limited. AAOx3. NAD. Normal affect.  Speech and respirations are unlabored.  Accessory Clinical Findings    None  Assessment & Plan    1.  Preoperative Cardiovascular Risk  Assessment: According to the Revised Cardiac Risk Index (RCRI), his Perioperative Risk of Major Cardiac Event is (%): 6.6. His Functional Capacity in METs is: 7.59 according to the Duke Activity Status Index (DASI). The patient is doing well from a cardiac perspective. Therefore, based on ACC/AHA guidelines, the patient would be at acceptable risk for the planned procedure without further cardiovascular testing.   The patient was advised that if he develops new symptoms prior to surgery to contact our office to arrange for a follow-up visit, and he verbalized understanding.  Per office protocol, patient can hold Eliquis for 1-2 days prior to procedure.    A copy of this note will be routed to requesting surgeon.  Time:   Today, I have spent 5 minutes with the patient with telehealth technology discussing medical history, symptoms, and management plan.    Levi Aland, NP-C  02/27/2023, 9:00 AM [address] Office [phone] Fax [phone]

## 2023-02-25 ENCOUNTER — Encounter: Payer: Self-pay | Admitting: Internal Medicine

## 2023-02-25 ENCOUNTER — Other Ambulatory Visit: Payer: Self-pay

## 2023-02-25 ENCOUNTER — Ambulatory Visit: Payer: Medicare Other | Attending: Internal Medicine | Admitting: Internal Medicine

## 2023-02-25 VITALS — BP 116/68 | HR 61 | Ht 70.0 in | Wt 159.2 lb

## 2023-02-25 DIAGNOSIS — I480 Paroxysmal atrial fibrillation: Secondary | ICD-10-CM

## 2023-02-25 DIAGNOSIS — I493 Ventricular premature depolarization: Secondary | ICD-10-CM

## 2023-02-25 NOTE — Progress Notes (Signed)
HPI Mr. Sedano returns for ongoing evaluation of PVC's and PAF. 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 He has an EF of 40% which has been present for 12 years. He has developed PAF and after dofetilide failed, underwent catheter ablation. He has had some break through episodes but these are rare. He had recurrent palpitations in the past and a cardiac monitor demonstrated a little less than 10% PVC's. He started farxiga and the PVC's went away and stayed away. He developed a yeast infection and has stopped this medication.  No Known Allergies   Current Outpatient Medications  Medication Sig Dispense Refill   acetaminophen (TYLENOL) 325 MG tablet Take 325 mg by mouth every 6 (six) hours as needed for mild pain.     apixaban (ELIQUIS) 5 MG TABS tablet Take 1 tablet (5 mg total) by mouth 2 (two) times daily. 180 tablet 1   cetirizine (ZYRTEC) 10 MG tablet Take 10 mg by mouth daily.     Cholecalciferol 25 MCG (1000 UT) tablet Take 1,000 Units by mouth in the morning, at noon, and at bedtime.     COVID-19 mRNA vaccine 2023-2024 (COMIRNATY) syringe Inject into the muscle. 0.3 mL 0   influenza vaccine adjuvanted (FLUAD) 0.5 ML injection Inject into the muscle. 0.5 mL 0   losartan (COZAAR) 25 MG tablet Take 1 tablet (25 mg total) by mouth daily. 90 tablet 3   METAMUCIL FIBER PO Take 1 Scoop by mouth in the morning and at bedtime.     metoprolol succinate (TOPROL XL) 25 MG 24 hr tablet Take 1 tablet (25 mg total) by mouth daily. 90 tablet 3   polyethylene glycol-electrolytes (NULYTELY) 420 g solution Use as directed 4000 mL 0   rosuvastatin (CRESTOR) 5 MG tablet Take 1 tablet (5 mg total) by mouth daily. 90 tablet 0   Saccharomyces boulardii (FLORASTOR PO) Take 1 capsule by mouth daily.     tamsulosin (FLOMAX) 0.4 MG CAPS capsule Take 1 capsule (0.4 mg total) by mouth once daily 30 minutes after the same meal. 90  capsule 3   dapagliflozin propanediol (FARXIGA) 10 MG TABS tablet Take 1 tablet (10 mg total) by mouth daily before breakfast. (Patient not taking: Reported on 02/20/2023) 90 tablet 2   No current facility-administered medications for this visit.     Past Medical History:  Diagnosis Date   CERVICAL SPINE DISORDER, NOS    Chronic systolic (congestive) heart failure    EF 40%   Coronary artery disease    a. 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   Past Surgical History:  Procedure Laterality Date   ANAL RECTAL MANOMETRY Bilateral 08/21/2022   Procedure: ANO RECTAL MANOMETRY;  Surgeon: Willis Modena, MD;  Location: [facility];  Service: Gastroenterology;  Laterality: Bilateral;   APPENDECTOMY     CHOLECYSTECTOMY     CORONARY ARTERY BYPASS GRAFT  10/11/2009   ELECTROPHYSIOLOGIC STUDY N/A 09/17/2016   Procedure: Atrial Fibrillation Ablation;  Surgeon: Hillis Range, MD;  Location: [facility] INVASIVE CV LAB;  Service: Cardiovascular;  Laterality: N/A;   INGUINAL HERNIA  REPAIR Bilateral 04/09/2022   Procedure: OPEN BILATERAL INGUINAL HERNIA REPAIRS;  Surgeon: Manus Rudd, MD;  Location: [facility] OR;  Service: General;  Laterality: Bilateral;   INSERTION OF MESH Bilateral 04/09/2022   Procedure: INSERTION OF MESH;  Surgeon: Manus Rudd, MD;  Location: [facility] OR;  Service: General;  Laterality: Bilateral;   MULTIPLE TOOTH EXTRACTIONS  2000   pt states he also went under anesthesia for another dental surgery 2005 (he states they had to "break his jaw" for an overbite that he had)   WISDOM TOOTH EXTRACTION  1968     Family History  Problem Relation Age of Onset   Emphysema Mother    Emphysema Father      Social History    Socioeconomic History   Marital status: Married    Spouse name: Not on file   Number of children: 0   Years of education: Not on file   Highest education level: Not on file  Occupational History   Not on file  Tobacco Use   Smoking status: Never    Passive exposure: Yes   Smokeless tobacco: Never   Tobacco comments:    both parents smoked  Vaping Use   Vaping Use: Never used  Substance and Sexual Activity   Alcohol use: Yes    Alcohol/week: 1.0 standard drink of alcohol    Types: 1 Glasses of wine per week   Drug use: No   Sexual activity: Not Currently  Other Topics Concern   Not on file  Social History Narrative   Not on file   Social Determinants of Health   Financial Resource Strain: Not on file  Food Insecurity: Not on file  Transportation Needs: Not on file  Physical Activity: Not on file  Stress: Not on file  Social Connections: Not on file  Intimate Partner Violence: Not on file     BP 116/68   Pulse 61   Ht 5\' 10"  (1.778 m)   Wt 159 lb 3.2 oz (72.2 kg)   SpO2 97%   BMI 22.84 kg/m   Physical Exam:  Well appearing NAD HEENT: Unremarkable Neck:  No JVD, no thyromegally Lymphatics:  No adenopathy Back:  No CVA tenderness Lungs:  Clear HEART:  Regular rate rhythm, no murmurs, no rubs, no clicks Abd:  soft, positive bowel sounds, no organomegally, no rebound, no guarding Ext:  2 plus pulses, no edema, no cyanosis, no clubbing Skin:  No rashes no nodules Neuro:  CN II through XII intact, motor grossly intact  EKG - nsr  DEVICE  Normal device function.  See PaceArt for details.   Assess/Plan:  PVC's- I discussed the benign nature of his arrhythmias . He has stopped the farxiga and the PVC's have not really come back so we will plan on watchful waiting for now. ICM - he denies anginal symptoms despite walking vigorously. No change in meds. Atrial fib - not seen on his cardiac monitor. Appears to be controlled. Continue eliquis. Dyslipidemia -  continue statin therapy.  Sharlot Gowda Dakwan Pridgen,MD

## 2023-02-25 NOTE — Patient Instructions (Addendum)
Medication Instructions:  Your physician recommends that you continue on your current medications as directed. Please refer to the Current Medication list given to you today.  *If you need a refill on your cardiac medications before your next appointment, please call your pharmacy*  Lab Work: None ordered.  If you have labs (blood work) drawn today and your tests are completely normal, you will receive your results only by: MyChart Message (if you have MyChart) OR A paper copy in the mail If you have any lab test that is abnormal or we need to change your treatment, we will call you to review the results.  Testing/Procedures: None ordered.  Follow-Up: At [facility], you and your health needs are our priority.  As part of our continuing mission to provide you with exceptional heart care, we have created designated Provider Care Teams.  These Care Teams include your primary Cardiologist (physician) and Advanced Practice Providers (APPs -  Physician Assistants and Nurse Practitioners) who all work together to provide you with the care you need, when you need it.   Your next appointment:   1 year(s)  The format for your next appointment:   In Person  Provider:   Gregg Taylor, MD{or one of the following Advanced Practice Providers on your designated Care Team:   Renee Ursuy, PA-C Michael "Andy" Tillery, PA-C           

## 2023-02-26 ENCOUNTER — Other Ambulatory Visit (HOSPITAL_COMMUNITY): Payer: Self-pay

## 2023-02-26 ENCOUNTER — Other Ambulatory Visit: Payer: Self-pay

## 2023-02-26 MED ORDER — METOPROLOL SUCCINATE ER 25 MG PO TB24
25.0000 mg | ORAL_TABLET | Freq: Every day | ORAL | 0 refills | Status: DC
  Filled 2023-02-26: qty 90, 90d supply, fill #0

## 2023-02-27 ENCOUNTER — Ambulatory Visit: Payer: Medicare Other | Attending: Cardiovascular Disease | Admitting: Nurse Practitioner

## 2023-02-27 ENCOUNTER — Other Ambulatory Visit (HOSPITAL_COMMUNITY): Payer: Self-pay

## 2023-02-27 ENCOUNTER — Encounter: Payer: Self-pay | Admitting: Nurse Practitioner

## 2023-02-27 DIAGNOSIS — Z01810 Encounter for preprocedural cardiovascular examination: Secondary | ICD-10-CM

## 2023-03-04 DIAGNOSIS — K6389 Other specified diseases of intestine: Secondary | ICD-10-CM | POA: Diagnosis not present

## 2023-03-04 DIAGNOSIS — K648 Other hemorrhoids: Secondary | ICD-10-CM | POA: Diagnosis not present

## 2023-03-04 DIAGNOSIS — Z86010 Personal history of colonic polyps: Secondary | ICD-10-CM | POA: Diagnosis not present

## 2023-03-04 DIAGNOSIS — D125 Benign neoplasm of sigmoid colon: Secondary | ICD-10-CM | POA: Diagnosis not present

## 2023-03-19 DIAGNOSIS — H35373 Puckering of macula, bilateral: Secondary | ICD-10-CM | POA: Diagnosis not present

## 2023-03-19 DIAGNOSIS — H2513 Age-related nuclear cataract, bilateral: Secondary | ICD-10-CM | POA: Diagnosis not present

## 2023-03-19 DIAGNOSIS — H43811 Vitreous degeneration, right eye: Secondary | ICD-10-CM | POA: Diagnosis not present

## 2023-03-19 DIAGNOSIS — H35033 Hypertensive retinopathy, bilateral: Secondary | ICD-10-CM | POA: Diagnosis not present

## 2023-03-21 ENCOUNTER — Encounter: Payer: Self-pay | Admitting: Cardiology

## 2023-03-21 ENCOUNTER — Ambulatory Visit: Payer: Medicare Other | Attending: Cardiology | Admitting: Cardiology

## 2023-03-21 VITALS — BP 125/75 | HR 66 | Ht 70.0 in | Wt 157.0 lb

## 2023-03-21 DIAGNOSIS — I480 Paroxysmal atrial fibrillation: Secondary | ICD-10-CM

## 2023-03-21 DIAGNOSIS — I25709 Atherosclerosis of coronary artery bypass graft(s), unspecified, with unspecified angina pectoris: Secondary | ICD-10-CM | POA: Diagnosis not present

## 2023-03-21 NOTE — Patient Instructions (Signed)
Medication Instructions:  The current medical regimen is effective;  continue present plan and medications.  *If you need a refill on your cardiac medications before your next appointment, please call your pharmacy*   Follow-Up: At [facility], you and your health needs are our priority.  As part of our continuing mission to provide you with exceptional heart care, we have created designated Provider Care Teams.  These Care Teams include your primary Cardiologist (physician) and Advanced Practice Providers (APPs -  Physician Assistants and Nurse Practitioners) who all work together to provide you with the care you need, when you need it.  We recommend signing up for the patient portal called "MyChart".  Sign up information is provided on this After Visit Summary.  MyChart is used to connect with patients for Virtual Visits (Telemedicine).  Patients are able to view lab/test results, encounter notes, upcoming appointments, etc.  Non-urgent messages can be sent to your provider as well.   To learn more about what you can do with MyChart, go to [email or web address].    Your next appointment:   1 year(s)  Provider:   Dr Mark Skains      

## 2023-03-21 NOTE — Progress Notes (Signed)
Cardiology Office Note:    Date:  03/21/2023   ID:  Isaac [name], DOB April 14, 1951, MRN [id number]  PCP:  Marden Noble, MD   [facility] Providers Cardiologist:  Lesleigh Noe, MD (Inactive)     Referring MD: Marden Noble, MD    History of Present Illness:    Isaac [name] is a 72 y.o. male former patient of Dr. Sherilyn Cooter Smith's with PVCs paroxysmal atrial fibrillation coronary artery disease status post CABG after anterior myocardial infarction 10/2009.  Had atrial fibrillation ablation by Dr. Johney Frame on 09/17/2016.  Previously had failed Tikosyn.  Has an atretic LAD with nonischemic nuclear study in 2017.  Chronic combined heart failure, anticoagulation, frequent PVCs 10% burden.   Colonoscopy.  Previously stopped [place].  Had UTI/yeast infection.  Interestingly, his PVCs dissipated after he tried his [place] and they have not come back even after stopping the [place].  Former [employer].    Past Medical History:  Diagnosis Date   CERVICAL SPINE DISORDER, NOS    Chronic systolic (congestive) heart failure (HCC)    EF 40%   Coronary artery disease    a. 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, NOS    Dysrhythmia    PVC's occasionally per pt   Hypertension    METATARSALGIA    Myocardial infarction (HCC) 2010   OSTEOARTHROSIS, LOCAL, SCND, HAND    Paroxysmal atrial fibrillation (HCC)     Past Surgical History:  Procedure Laterality Date   ANAL RECTAL MANOMETRY Bilateral 08/21/2022   Procedure: ANO RECTAL MANOMETRY;  Surgeon: Willis Modena, MD;  Location: [facility];  Service: Gastroenterology;  Laterality: Bilateral;   APPENDECTOMY     CHOLECYSTECTOMY     CORONARY ARTERY BYPASS GRAFT  10/11/2009   ELECTROPHYSIOLOGIC STUDY N/A 09/17/2016   Procedure: Atrial Fibrillation Ablation;  Surgeon: Hillis Range, MD;  Location: [facility] INVASIVE CV LAB;  Service: Cardiovascular;  Laterality: N/A;   INGUINAL HERNIA REPAIR Bilateral 04/09/2022   Procedure:  OPEN BILATERAL INGUINAL HERNIA REPAIRS;  Surgeon: Manus Rudd, MD;  Location: [facility] OR;  Service: General;  Laterality: Bilateral;   INSERTION OF MESH Bilateral 04/09/2022   Procedure: INSERTION OF MESH;  Surgeon: Manus Rudd, MD;  Location: [facility] OR;  Service: General;  Laterality: Bilateral;   MULTIPLE TOOTH EXTRACTIONS  2000   pt states he also went under anesthesia for another dental surgery 2005 (he states they had to "break his jaw" for an overbite that he had)   WISDOM TOOTH EXTRACTION  1968    Current Medications: Current Meds  Medication Sig   acetaminophen (TYLENOL) 325 MG tablet Take 325 mg by mouth every 6 (six) hours as needed for mild pain.   apixaban (ELIQUIS) 5 MG TABS tablet Take 1 tablet (5 mg total) by mouth 2 (two) times daily.   cetirizine (ZYRTEC) 10 MG tablet Take 10 mg by mouth daily.   Cholecalciferol 25 MCG (1000 UT) tablet Take 1,000 Units by mouth in the morning, at noon, and at bedtime.   COVID-19 mRNA vaccine 2023-2024 (COMIRNATY) syringe Inject into the muscle.   influenza vaccine adjuvanted (FLUAD) 0.5 ML injection Inject into the muscle.   losartan (COZAAR) 25 MG tablet Take 1 tablet (25 mg total) by mouth daily.   METAMUCIL FIBER PO Take 1 Scoop by mouth in the morning and at bedtime.   metoprolol succinate (TOPROL XL) 25 MG 24 hr tablet Take 1 tablet (25 mg total) by mouth daily.   polyethylene glycol-electrolytes (  NULYTELY) 420 g solution Use as directed   rosuvastatin (CRESTOR) 5 MG tablet Take 1 tablet (5 mg total) by mouth daily.   Saccharomyces boulardii (FLORASTOR PO) Take 1 capsule by mouth daily.   tamsulosin (FLOMAX) 0.4 MG CAPS capsule Take 1 capsule (0.4 mg total) by mouth once daily 30 minutes after the same meal.   [DISCONTINUED] dapagliflozin propanediol (FARXIGA) 10 MG TABS tablet Take 1 tablet (10 mg total) by mouth daily before breakfast.     Allergies:   Patient has no known allergies.   Social History   Socioeconomic History    Marital status: Married    Spouse name: Not on file   Number of children: 0   Years of education: Not on file   Highest education level: Not on file  Occupational History   Not on file  Tobacco Use   Smoking status: Never    Passive exposure: Yes   Smokeless tobacco: Never   Tobacco comments:    both parents smoked  Vaping Use   Vaping Use: Never used  Substance and Sexual Activity   Alcohol use: Yes    Alcohol/week: 1.0 standard drink of alcohol    Types: 1 Glasses of wine per week   Drug use: No   Sexual activity: Not Currently  Other Topics Concern   Not on file  Social History Narrative   Not on file   Social Determinants of Health   Financial Resource Strain: Not on file  Food Insecurity: Not on file  Transportation Needs: Not on file  Physical Activity: Not on file  Stress: Not on file  Social Connections: Not on file     Family History: The patient's family history includes Emphysema in his father and mother.  ROS:   Please see the history of present illness.     All other systems reviewed and are negative.  EKGs/Labs/Other Studies Reviewed:    The following studies were reviewed today: Cardiac Studies & Procedures     STRESS TESTS  MYOCARDIAL PERFUSION IMAGING 10/24/2016  Narrative  Nuclear stress EF: 40%.  Findings consistent with prior myocardial infarction.  This is a high risk study.  The left ventricular ejection fraction is moderately decreased (30-44%).  Defect 1: There is a defect present in the mid anterior, mid anteroseptal, mid anterolateral, apical anterior, apical septal and apex location. Fixed, consistent with scar  No significant change from previous scan   ECHOCARDIOGRAM  ECHOCARDIOGRAM COMPLETE 11/29/2021  Narrative ECHOCARDIOGRAM REPORT    Patient Name:   Isaac [name] Date of Exam: 11/29/2021 Medical Rec #:  [id number]       Height:       70.0 in Accession #:    [id number]      Weight:       152.8 lb Date of  Birth:  05-15-1951       BSA:          1.862 m Patient Age:    71 years        BP:           114/68 mmHg Patient Gender: M               HR:           60 bpm. Exam Location:  [address]  Procedure: 2D Echo, Cardiac Doppler, Color Doppler and Intracardiac Opacification Agent  Indications:    I50.21 Acute systolic (congestive) heart failure  History:        Patient has  prior history of Echocardiogram examinations, most recent 11/27/2017. CAD and Previous Myocardial Infarction, Prior CABG, Arrythmias:Atrial Fibrillation, Signs/Symptoms:Chest Pain; Risk Factors:Dyslipidemia.  Sonographer:    Cathie Beams RCS Referring Phys: [phone] Barry Dienes [facility]  IMPRESSIONS   1. Left ventricular ejection fraction, by estimation, is 40 to 45%. The left ventricle has mildly decreased function. The left ventricle demonstrates global hypokinesis. There is mild concentric left ventricular hypertrophy. Left ventricular diastolic parameters are consistent with Grade I diastolic dysfunction (impaired relaxation). 2. Right ventricular systolic function is normal. The right ventricular size is normal. 3. The mitral valve is normal in structure. Trivial mitral valve regurgitation. No evidence of mitral stenosis. 4. Tricuspid valve regurgitation is mild to moderate. 5. The aortic valve is normal in structure. Aortic valve regurgitation is not visualized. Aortic valve sclerosis is present, with no evidence of aortic valve stenosis. 6. The inferior vena cava is normal in size with greater than 50% respiratory variability, suggesting right atrial pressure of 3 mmHg.  Comparison(s): A prior study was performed on 11/27/2017. No significant change from prior study.  FINDINGS Left Ventricle: Left ventricular ejection fraction, by estimation, is 40 to 45%. The left ventricle has mildly decreased function. The left ventricle demonstrates global hypokinesis. Definity contrast agent was given IV to delineate the left  ventricular endocardial borders. The left ventricular internal cavity size was normal in size. There is mild concentric left ventricular hypertrophy. Left ventricular diastolic parameters are consistent with Grade I diastolic dysfunction (impaired relaxation).  Right Ventricle: The right ventricular size is normal. No increase in right ventricular wall thickness. Right ventricular systolic function is normal.  Left Atrium: Left atrial size was normal in size.  Right Atrium: Right atrial size was normal in size.  Pericardium: There is no evidence of pericardial effusion.  Mitral Valve: The mitral valve is normal in structure. Trivial mitral valve regurgitation. No evidence of mitral valve stenosis.  Tricuspid Valve: The tricuspid valve is normal in structure. Tricuspid valve regurgitation is mild to moderate. No evidence of tricuspid stenosis.  Aortic Valve: The aortic valve is normal in structure. Aortic valve regurgitation is not visualized. Aortic valve sclerosis is present, with no evidence of aortic valve stenosis.  Pulmonic Valve: The pulmonic valve was normal in structure. Pulmonic valve regurgitation is not visualized. No evidence of pulmonic stenosis.  Aorta: The aortic root is normal in size and structure.  Venous: The inferior vena cava is normal in size with greater than 50% respiratory variability, suggesting right atrial pressure of 3 mmHg.  IAS/Shunts: No atrial level shunt detected by color flow Doppler.   LEFT VENTRICLE PLAX 2D LVIDd:         4.55 cm   Diastology LVIDs:         3.60 cm   LV e' medial:    7.51 cm/s LV PW:         1.10 cm   LV E/e' medial:  8.3 LV IVS:        1.05 cm   LV e' lateral:   13.10 cm/s LVOT diam:     2.00 cm   LV E/e' lateral: 4.8 LV SV:         50 LV SV Index:   27 LVOT Area:     3.14 cm   RIGHT VENTRICLE RV S prime:     9.79 cm/s TAPSE (M-mode): 1.9 cm RVSP:           20.1 mmHg  LEFT ATRIUM  Index        RIGHT ATRIUM            Index LA diam:        3.80 cm 2.04 cm/m   RA Pressure: 3.00 mmHg LA Vol (A2C):   47.7 ml 25.62 ml/m  RA Area:     19.55 cm LA Vol (A4C):   51.0 ml 27.39 ml/m  RA Volume:   56.60 ml  30.40 ml/m LA Biplane Vol: 50.6 ml 27.18 ml/m AORTIC VALVE LVOT Vmax:   68.30 cm/s LVOT Vmean:  48.200 cm/s LVOT VTI:    0.160 m  AORTA Ao Root diam: 3.20 cm  MITRAL VALVE               TRICUSPID VALVE MV Area (PHT): 1.62 cm    TR Peak grad:   17.1 mmHg MV Decel Time: 468 msec    TR Vmax:        207.00 cm/s MV E velocity: 62.40 cm/s  Estimated RAP:  3.00 mmHg MV A velocity: 56.10 cm/s  RVSP:           20.1 mmHg MV E/A ratio:  1.11 SHUNTS Systemic VTI:  0.16 m Systemic Diam: 2.00 cm  Kardie Tobb DO Electronically signed by Thomasene Ripple DO Signature Date/Time: 11/29/2021/2:36:45 PM    Final    MONITORS  LONG TERM MONITOR (3-14 DAYS) 12/07/2021  Narrative  Basic rhythm is NSR  PVC's account for symptoms  PVC burden ~ 10%  4 non-sustained SVT salvos, longest 5 beats with longest   Patch Wear Time:  6 days and 23 hours (2023-01-11T20:51:14-0500 to 2023-01-18T20:45:26-0500)  Patient had a min HR of 47 bpm, max HR of 187 bpm, and avg HR of 63 bpm. Predominant underlying rhythm was Sinus Rhythm. 4 Supraventricular Tachycardia runs occurred, the run with the fastest interval lasting 4 beats with a max rate of 187 bpm, the longest lasting 5 beats with an avg rate of 116 bpm. Isolated SVEs were rare (<1.0%), SVE Couplets were rare (<1.0%), and SVE Triplets were rare (<1.0%). Isolated VEs were frequent (8.5%, 54834), VE Couplets were occasional (1.2%, 3927), and VE Triplets were rare (<1.0%, 113). Ventricular Trigeminy was present.    CARDIAC MRI  MR CARDIAC MORPHOLOGY W WO CONTRAST 12/02/2010  Narrative Clinical Data: Coronary artery disease  MR CARDIA MORPHOLOGY WITHOUT AND WITH CONTRAST  GE 1.5 T magnet with dedicated cardiac coil.  FIESTA sequences for function and  morphology.  10 minutes after Magnevist contrast was injected, inversion recovery sequences were done to assess for delayed enhancement.  EF was calculated at a dedicated workstation.  Comparison: None.  Findings: There was a median sternotomy.  The left ventricle was normal in size.  There was thinning and akinesis of the mid to apical anterior and anteroseptal walls and the true apex.  The remainder of the LV contracted vigorously.  EF was calculated to be 47%. The left atrium was moderately dilated.  The right ventricle was normal in size and systolic function.  The right atrium was mildly dilated.  The aortic valve was trileaflet with no aortic stenosis or regurgitation.  Though flow sequences were not done to quantify, there appeared to be mild mitral regurgitation and mild tricuspid regurgitation.  Delayed enhancement imaging showed < 25% thickness subendocardial scar in the basal anterior and anteroseptal walls.  There was 76- 99% thickness subendocardial delayed enhancement in the mid to apical anterior and anteroseptal walls and the true apex.  Measurements:  LV  EDV 182 mL  LV SV 87 mL  LV EF 47%  IMPRESSION: 1. Normal LV size with mildly decreased systolic function, EF 47%.  2. Thin and akinetic mid to apical anterior and anteroseptal walls and true apex.  These segments have 76-99% thickness subendocardial delayed enhancement, suggesting that they would be unlikely to improve in function with revascularization.  Provider: Lady Deutscher          EKG:  no new  Recent Labs: 04/01/2022: BUN 16; Creatinine, Ser 1.01; Hemoglobin 15.5; Platelets 178; Potassium 3.7; Sodium 139  Recent Lipid Panel    Component Value Date/Time   CHOL 125 12/31/2017 1120   TRIG 86 12/31/2017 1120   HDL 48 12/31/2017 1120   CHOLHDL 2.6 12/31/2017 1120   CHOLHDL 2.9 05/22/2012 0221   VLDL 21 05/22/2012 0221   LDLCALC 60 12/31/2017 1120     Risk Assessment/Calculations:                Physical Exam:    VS:  BP 125/75 (BP Location: Left Arm, Patient Position: Sitting, Cuff Size: Normal)   Pulse 66   Ht 5\' 10"  (1.778 m)   Wt 157 lb (71.2 kg)   BMI 22.53 kg/m     Wt Readings from Last 3 Encounters:  03/21/23 157 lb (71.2 kg)  02/25/23 159 lb 3.2 oz (72.2 kg)  04/01/22 148 lb 11.2 oz (67.4 kg)     GEN:  Well nourished, well developed in no acute distress HEENT: Normal NECK: No JVD; No carotid bruits LYMPHATICS: No lymphadenopathy CARDIAC: RRR, no murmurs, rubs, gallops RESPIRATORY:  Clear to auscultation without rales, wheezing or rhonchi  ABDOMEN: Soft, non-tender, non-distended MUSCULOSKELETAL:  No edema; No deformity  SKIN: Warm and dry NEUROLOGIC:  Alert and oriented x 3 PSYCHIATRIC:  Normal affect   ASSESSMENT:    1. Coronary artery disease involving coronary bypass graft of native heart with angina pectoris (HCC)   2. Paroxysmal atrial fibrillation (HCC)    PLAN:    In order of problems listed above:  Coronary artery disease status post bypass graft prior anterior MI - Stable no anginal symptoms.  Continue with goal-directed medical therapy  Chronic systolic heart failure - EF 40 to 45%.  Does not tolerate SGLT2 inhibitor, Marcelline Deist.  Had a yeast infection.  Had to take fluconazole.  Dr. Orson Aloe.  Overall doing quite well.  No significant fevers chills nausea vomiting syncope bleeding.  PVCs -9% on monitor.  Dr. Ladona Ridgel has seen.  Interestingly he stated that after he took his Marcelline Deist they went away.  They have not come back since even stopping the [place].  Sometimes he will have left-sided palpitations when laying on that side.  Paroxysmal atrial fibrillation - Dr. Johney Frame ablation.  Hyperlipidemia - Statin therapy.            Medication Adjustments/Labs and Tests Ordered: Current medicines are reviewed at length with the patient today.  Concerns regarding medicines are outlined above.  No orders of the defined  types were placed in this encounter.  No orders of the defined types were placed in this encounter.   Patient Instructions  Medication Instructions:  The current medical regimen is effective;  continue present plan and medications.  *If you need a refill on your cardiac medications before your next appointment, please call your pharmacy*  Follow-Up: At [facility], you and your health needs are our priority.  As part of our continuing mission to provide you with exceptional heart care,  we have created designated Provider Care Teams.  These Care Teams include your primary Cardiologist (physician) and Advanced Practice Providers (APPs -  Physician Assistants and Nurse Practitioners) who all work together to provide you with the care you need, when you need it.  We recommend signing up for the patient portal called "MyChart".  Sign up information is provided on this After Visit Summary.  MyChart is used to connect with patients for Virtual Visits (Telemedicine).  Patients are able to view lab/test results, encounter notes, upcoming appointments, etc.  Non-urgent messages can be sent to your provider as well.   To learn more about what you can do with MyChart, go to [email or web address].    Your next appointment:   1 year(s)  Provider:   Dr Donato Schultz       Signed, Donato Schultz, MD  03/21/2023 11:32 AM    [facility]

## 2023-03-25 ENCOUNTER — Other Ambulatory Visit: Payer: Self-pay

## 2023-03-26 ENCOUNTER — Other Ambulatory Visit (HOSPITAL_COMMUNITY): Payer: Self-pay

## 2023-03-27 ENCOUNTER — Other Ambulatory Visit (HOSPITAL_COMMUNITY): Payer: Self-pay

## 2023-03-27 ENCOUNTER — Other Ambulatory Visit: Payer: Self-pay | Admitting: *Deleted

## 2023-03-27 MED ORDER — LOSARTAN POTASSIUM 25 MG PO TABS
25.0000 mg | ORAL_TABLET | Freq: Every day | ORAL | 3 refills | Status: DC
  Filled 2023-03-27: qty 90, 90d supply, fill #0
  Filled 2023-06-23: qty 90, 90d supply, fill #1
  Filled 2023-09-02: qty 90, 90d supply, fill #2

## 2023-03-28 ENCOUNTER — Other Ambulatory Visit: Payer: Self-pay

## 2023-04-22 ENCOUNTER — Other Ambulatory Visit (HOSPITAL_COMMUNITY): Payer: Self-pay

## 2023-04-24 ENCOUNTER — Other Ambulatory Visit (HOSPITAL_COMMUNITY): Payer: Self-pay

## 2023-05-07 ENCOUNTER — Other Ambulatory Visit (HOSPITAL_COMMUNITY): Payer: Self-pay

## 2023-05-07 ENCOUNTER — Other Ambulatory Visit: Payer: Self-pay | Admitting: Cardiology

## 2023-05-07 MED ORDER — ROSUVASTATIN CALCIUM 5 MG PO TABS
5.0000 mg | ORAL_TABLET | Freq: Every day | ORAL | 3 refills | Status: DC
  Filled 2023-05-07: qty 90, 90d supply, fill #0
  Filled 2023-08-07: qty 90, 90d supply, fill #1
  Filled 2023-11-06: qty 90, 90d supply, fill #2
  Filled 2024-02-02: qty 90, 90d supply, fill #3

## 2023-05-19 DIAGNOSIS — Z23 Encounter for immunization: Secondary | ICD-10-CM | POA: Diagnosis not present

## 2023-05-19 DIAGNOSIS — I480 Paroxysmal atrial fibrillation: Secondary | ICD-10-CM | POA: Diagnosis not present

## 2023-05-19 DIAGNOSIS — E559 Vitamin D deficiency, unspecified: Secondary | ICD-10-CM | POA: Diagnosis not present

## 2023-05-19 DIAGNOSIS — D6869 Other thrombophilia: Secondary | ICD-10-CM | POA: Diagnosis not present

## 2023-05-19 DIAGNOSIS — Z0000 Encounter for general adult medical examination without abnormal findings: Secondary | ICD-10-CM | POA: Diagnosis not present

## 2023-05-19 DIAGNOSIS — I2510 Atherosclerotic heart disease of native coronary artery without angina pectoris: Secondary | ICD-10-CM | POA: Diagnosis not present

## 2023-05-19 DIAGNOSIS — Z79899 Other long term (current) drug therapy: Secondary | ICD-10-CM | POA: Diagnosis not present

## 2023-05-19 DIAGNOSIS — I5042 Chronic combined systolic (congestive) and diastolic (congestive) heart failure: Secondary | ICD-10-CM | POA: Diagnosis not present

## 2023-05-19 DIAGNOSIS — E039 Hypothyroidism, unspecified: Secondary | ICD-10-CM | POA: Diagnosis not present

## 2023-05-19 DIAGNOSIS — L84 Corns and callosities: Secondary | ICD-10-CM | POA: Diagnosis not present

## 2023-05-19 LAB — COMPREHENSIVE METABOLIC PANEL: EGFR: 83

## 2023-05-26 ENCOUNTER — Other Ambulatory Visit: Payer: Self-pay | Admitting: Cardiology

## 2023-05-27 ENCOUNTER — Other Ambulatory Visit: Payer: Self-pay

## 2023-05-27 ENCOUNTER — Other Ambulatory Visit (HOSPITAL_COMMUNITY): Payer: Self-pay

## 2023-05-27 MED ORDER — METOPROLOL SUCCINATE ER 25 MG PO TB24
25.0000 mg | ORAL_TABLET | Freq: Every day | ORAL | 3 refills | Status: DC
  Filled 2023-05-27: qty 90, 90d supply, fill #0
  Filled 2023-08-22: qty 90, 90d supply, fill #1
  Filled 2023-11-18: qty 90, 90d supply, fill #2
  Filled 2024-02-17: qty 90, 90d supply, fill #3

## 2023-05-27 NOTE — Telephone Encounter (Signed)
Pt's medication was sent to pt's pharmacy as requested. Confirmation received.  °

## 2023-06-03 ENCOUNTER — Other Ambulatory Visit: Payer: Self-pay | Admitting: Cardiology

## 2023-06-03 ENCOUNTER — Other Ambulatory Visit (HOSPITAL_COMMUNITY): Payer: Self-pay

## 2023-06-03 DIAGNOSIS — I480 Paroxysmal atrial fibrillation: Secondary | ICD-10-CM

## 2023-06-03 MED ORDER — APIXABAN 5 MG PO TABS
5.0000 mg | ORAL_TABLET | Freq: Two times a day (BID) | ORAL | 1 refills | Status: DC
Start: 2023-06-03 — End: 2023-11-27
  Filled 2023-06-03: qty 180, 90d supply, fill #0
  Filled 2023-08-28: qty 180, 90d supply, fill #1

## 2023-06-03 NOTE — Telephone Encounter (Addendum)
Eliquis 5mg  refill request received. Patient is 72 years old, weight-71.2kg, Crea-0.96 on 05/20/23 via [facility] Everywhere from [place], [place], and last seen by Dr. Anne Fu on 03/21/23. Dose is appropriate based on dosing criteria.

## 2023-06-04 ENCOUNTER — Encounter: Payer: Self-pay | Admitting: Podiatry

## 2023-06-04 ENCOUNTER — Ambulatory Visit: Payer: Medicare Other | Admitting: Podiatry

## 2023-06-04 DIAGNOSIS — L84 Corns and callosities: Secondary | ICD-10-CM

## 2023-06-04 DIAGNOSIS — Z7901 Long term (current) use of anticoagulants: Secondary | ICD-10-CM

## 2023-06-04 NOTE — Progress Notes (Signed)
  Subjective:  Patient ID: Isaac [name], male    DOB: [birth year],   MRN: [id number]  Chief Complaint  Patient presents with   Callouses    Pt came in today for calluses  and corn on both feet worse on the right that he had for at least 3 years the pain is there but not  as that bad. He tried some corn patches in the past but did not help much.     72 y.o. male presents for concern of calluses and corns on both of his feet as above. He is on blood thinners and at risk for foot care.  . Denies any other pedal complaints. Denies n/v/f/c.   Past Medical History:  Diagnosis Date   CERVICAL SPINE DISORDER, NOS    Chronic systolic (congestive) heart failure (HCC)    EF 40%   Coronary artery disease    a. 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 CFT <3 seconds. Normal hair growth on digits. No edema.  Skin. No lacerations or abrasions bilateral feet.  Hyperkeratotic tissue noted plantar second and third and fifth metatarsal on the right and third metatarsal head on the left.  Musculoskeletal: MMT 5/5 bilateral lower extremities in DF, PF, Inversion and Eversion. Deceased ROM in DF of ankle joint.  Neurological: Sensation intact to light touch.   Assessment:   1. Corn or callus   2. Chronic anticoagulation      Plan:  Patient was evaluated and treated and all questions answered. -Hyperkeratotic tissue was debrided with chisel without incident.  -Applied salycylic acid treatment to area with dressing. Advised to remove bandaging tomorrow.  -Encouraged daily moisturizing -Discussed use of pumice stone -Advised good supportive shoes and inserts -Answered all patient questions -Patient to return  in 3 months for at risk  foot care -Patient advised to call the office if any problems or questions arise in the meantime.   Louann Sjogren, DPM

## 2023-06-23 ENCOUNTER — Other Ambulatory Visit (HOSPITAL_COMMUNITY): Payer: Self-pay

## 2023-07-21 ENCOUNTER — Other Ambulatory Visit (HOSPITAL_COMMUNITY): Payer: Self-pay

## 2023-07-22 ENCOUNTER — Other Ambulatory Visit (HOSPITAL_COMMUNITY): Payer: Self-pay

## 2023-07-22 MED ORDER — TAMSULOSIN HCL 0.4 MG PO CAPS
0.4000 mg | ORAL_CAPSULE | Freq: Every day | ORAL | 3 refills | Status: DC
  Filled 2023-07-22: qty 90, 90d supply, fill #0
  Filled 2023-10-20: qty 90, 90d supply, fill #1
  Filled 2024-01-16: qty 90, 90d supply, fill #2
  Filled 2024-04-14: qty 90, 90d supply, fill #3

## 2023-07-23 ENCOUNTER — Other Ambulatory Visit (HOSPITAL_COMMUNITY): Payer: Self-pay

## 2023-07-23 MED ORDER — AZITHROMYCIN 500 MG PO TABS
ORAL_TABLET | ORAL | 0 refills | Status: DC
  Filled 2023-07-23: qty 4, 3d supply, fill #0

## 2023-08-18 ENCOUNTER — Other Ambulatory Visit (HOSPITAL_BASED_OUTPATIENT_CLINIC_OR_DEPARTMENT_OTHER): Payer: Self-pay

## 2023-08-18 MED ORDER — COVID-19 MRNA VAC-TRIS(PFIZER) 30 MCG/0.3ML IM SUSY
0.3000 mL | PREFILLED_SYRINGE | Freq: Once | INTRAMUSCULAR | 0 refills | Status: AC
  Filled 2023-08-18: qty 0.3, 1d supply, fill #0

## 2023-08-18 MED ORDER — INFLUENZA VAC A&B SURF ANT ADJ 0.5 ML IM SUSY
0.5000 mL | PREFILLED_SYRINGE | Freq: Once | INTRAMUSCULAR | 0 refills | Status: AC
  Filled 2023-08-18: qty 0.5, 1d supply, fill #0

## 2023-08-22 ENCOUNTER — Other Ambulatory Visit: Payer: Self-pay

## 2023-08-28 ENCOUNTER — Other Ambulatory Visit (HOSPITAL_COMMUNITY): Payer: Self-pay

## 2023-09-01 ENCOUNTER — Ambulatory Visit: Payer: Medicare Other | Admitting: Podiatry

## 2023-09-02 ENCOUNTER — Other Ambulatory Visit (HOSPITAL_COMMUNITY): Payer: Self-pay

## 2023-09-08 ENCOUNTER — Ambulatory Visit: Payer: Medicare Other | Admitting: Podiatry

## 2023-10-13 ENCOUNTER — Encounter: Payer: Self-pay | Admitting: Podiatry

## 2023-10-13 ENCOUNTER — Ambulatory Visit: Payer: Medicare Other | Admitting: Podiatry

## 2023-10-13 DIAGNOSIS — Z7901 Long term (current) use of anticoagulants: Secondary | ICD-10-CM

## 2023-10-13 DIAGNOSIS — L84 Corns and callosities: Secondary | ICD-10-CM

## 2023-10-13 NOTE — Progress Notes (Signed)
  Subjective:  Patient ID: Isaac [name], male    DOB: 29-May-1951,   MRN: [id number]  Chief Complaint  Patient presents with   Nail Problem    RFC.    72 y.o. male presents for concern of calluses and corns on both of his feet as above. He is on blood thinners and at risk for foot care.  . Denies any other pedal complaints. Denies n/v/f/c.   Past Medical History:  Diagnosis Date   CERVICAL SPINE DISORDER, NOS    Chronic systolic (congestive) heart failure (HCC)    EF 40%   Coronary artery disease    a. 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 CFT <3 seconds. Normal hair growth on digits. No edema.  Skin. No lacerations or abrasions bilateral feet.  Hyperkeratotic tissue noted plantar second and third and fifth metatarsal on the right and third metatarsal head on the left.  Musculoskeletal: MMT 5/5 bilateral lower extremities in DF, PF, Inversion and Eversion. Deceased ROM in DF of ankle joint.  Neurological: Sensation intact to light touch.   Assessment:   1. Corn or callus   2. Chronic anticoagulation      Plan:  Patient was evaluated and treated and all questions answered. -Hyperkeratotic tissue was debrided with chisel without incident x5.  -Applied salycylic acid treatment to area with dressing. Advised to remove bandaging tomorrow.  -Encouraged daily moisturizing -Discussed use of pumice stone -Advised good supportive shoes and inserts -Answered all patient questions -Patient to return  in 3 months for at risk foot care -Patient advised to call the office if any problems or questions arise in the meantime.   Louann Sjogren, DPM

## 2023-10-23 DIAGNOSIS — Z8669 Personal history of other diseases of the nervous system and sense organs: Secondary | ICD-10-CM | POA: Diagnosis not present

## 2023-10-23 DIAGNOSIS — H35033 Hypertensive retinopathy, bilateral: Secondary | ICD-10-CM | POA: Diagnosis not present

## 2023-10-23 DIAGNOSIS — H35373 Puckering of macula, bilateral: Secondary | ICD-10-CM | POA: Diagnosis not present

## 2023-10-23 DIAGNOSIS — H31092 Other chorioretinal scars, left eye: Secondary | ICD-10-CM | POA: Diagnosis not present

## 2023-10-23 DIAGNOSIS — H2513 Age-related nuclear cataract, bilateral: Secondary | ICD-10-CM | POA: Diagnosis not present

## 2023-10-23 DIAGNOSIS — H43811 Vitreous degeneration, right eye: Secondary | ICD-10-CM | POA: Diagnosis not present

## 2023-11-27 ENCOUNTER — Other Ambulatory Visit (HOSPITAL_COMMUNITY): Payer: Self-pay

## 2023-11-27 ENCOUNTER — Other Ambulatory Visit: Payer: Self-pay | Admitting: Cardiology

## 2023-11-27 DIAGNOSIS — I480 Paroxysmal atrial fibrillation: Secondary | ICD-10-CM

## 2023-11-27 MED ORDER — APIXABAN 5 MG PO TABS
5.0000 mg | ORAL_TABLET | Freq: Two times a day (BID) | ORAL | 3 refills | Status: DC
  Filled 2023-11-27: qty 180, 90d supply, fill #0
  Filled 2024-02-25: qty 180, 90d supply, fill #1
  Filled 2024-05-24: qty 180, 90d supply, fill #2
  Filled 2024-08-23: qty 180, 90d supply, fill #3

## 2023-11-27 NOTE — Telephone Encounter (Signed)
Prescription refill request for Eliquis received. Indication: a fib Last office visit: 03/21/23 Scr: 0.96 05/19/23 epic Age: 73 Weight: 71kg

## 2023-11-28 ENCOUNTER — Other Ambulatory Visit (HOSPITAL_COMMUNITY): Payer: Self-pay

## 2023-12-17 DIAGNOSIS — L821 Other seborrheic keratosis: Secondary | ICD-10-CM | POA: Diagnosis not present

## 2023-12-17 DIAGNOSIS — R21 Rash and other nonspecific skin eruption: Secondary | ICD-10-CM | POA: Diagnosis not present

## 2023-12-17 DIAGNOSIS — Z85828 Personal history of other malignant neoplasm of skin: Secondary | ICD-10-CM | POA: Diagnosis not present

## 2023-12-17 DIAGNOSIS — L570 Actinic keratosis: Secondary | ICD-10-CM | POA: Diagnosis not present

## 2023-12-22 ENCOUNTER — Ambulatory Visit: Payer: Medicare Other | Admitting: Podiatry

## 2023-12-22 ENCOUNTER — Encounter: Payer: Self-pay | Admitting: Podiatry

## 2023-12-22 DIAGNOSIS — Z7901 Long term (current) use of anticoagulants: Secondary | ICD-10-CM | POA: Diagnosis not present

## 2023-12-22 DIAGNOSIS — L84 Corns and callosities: Secondary | ICD-10-CM | POA: Diagnosis not present

## 2023-12-22 NOTE — Progress Notes (Signed)
  Subjective:  Patient ID: Isaac [name], male    DOB: 27-Jun-1951,   MRN: [id number]  No chief complaint on file.   74 y.o. male presents for concern of calluses and corns on both of his feet as above. He is on blood thinners and at risk for foot care.  . Denies any other pedal complaints. Denies n/v/f/c.   Past Medical History:  Diagnosis Date   CERVICAL SPINE DISORDER, NOS    Chronic systolic (congestive) heart failure (HCC)    EF 40%   Coronary artery disease    a. 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 CFT <3 seconds. Normal hair growth on digits. No edema.  Skin. No lacerations or abrasions bilateral feet.  Hyperkeratotic tissue noted plantar second and third and fifth metatarsal on the right and third metatarsal head on the left.  Musculoskeletal: MMT 5/5 bilateral lower extremities in DF, PF, Inversion and Eversion. Deceased ROM in DF of ankle joint.  Neurological: Sensation intact to light touch.   Assessment:   1. Corn or callus   2. Chronic anticoagulation      Plan:  Patient was evaluated and treated and all questions answered. -Hyperkeratotic tissue plantar second and third and fifth metatarsal on the right and third metatarsal on the left was debrided with chisel without incident x4.  -Applied salycylic acid treatment to area with dressing. Advised to remove bandaging tomorrow.  -Encouraged daily moisturizing -Discussed use of pumice stone -Advised good supportive shoes and inserts -Answered all patient questions -Patient to return  in 3 months for at risk foot care -Patient advised to call the office if any problems or questions arise in the meantime.   Jennefer Moats, DPM

## 2024-02-02 ENCOUNTER — Other Ambulatory Visit (HOSPITAL_COMMUNITY): Payer: Self-pay

## 2024-02-08 ENCOUNTER — Other Ambulatory Visit: Payer: Self-pay | Admitting: Cardiology

## 2024-02-09 ENCOUNTER — Other Ambulatory Visit (HOSPITAL_COMMUNITY): Payer: Self-pay

## 2024-02-09 MED ORDER — ROSUVASTATIN CALCIUM 5 MG PO TABS
5.0000 mg | ORAL_TABLET | Freq: Every day | ORAL | 0 refills | Status: DC
  Filled 2024-02-09 – 2024-05-03 (×2): qty 90, 90d supply, fill #0

## 2024-02-10 ENCOUNTER — Other Ambulatory Visit (HOSPITAL_COMMUNITY): Payer: Self-pay

## 2024-02-23 ENCOUNTER — Ambulatory Visit: Payer: Medicare Other | Admitting: Student

## 2024-02-24 DIAGNOSIS — H5213 Myopia, bilateral: Secondary | ICD-10-CM | POA: Diagnosis not present

## 2024-02-24 DIAGNOSIS — H2513 Age-related nuclear cataract, bilateral: Secondary | ICD-10-CM | POA: Diagnosis not present

## 2024-02-24 DIAGNOSIS — H43813 Vitreous degeneration, bilateral: Secondary | ICD-10-CM | POA: Diagnosis not present

## 2024-02-24 DIAGNOSIS — H532 Diplopia: Secondary | ICD-10-CM | POA: Diagnosis not present

## 2024-02-24 NOTE — Progress Notes (Unsigned)
  Electrophysiology Office Note:   Date:  02/25/2024  ID:  Isaac [name], DOB 19-Aug-1951, MRN [id number]  Primary Cardiologist: Mickiel Albany, MD (Inactive) Electrophysiologist: Manya Sells, MD      History of Present Illness:   Isaac [name] is a 73 y.o. male with h/o PVCs and PAF seen today for routine electrophysiology followup.   Since last being seen in our clinic the patient reports doing great. Overall,  he denies chest pain, palpitations, dyspnea, PND, orthopnea, nausea, vomiting, dizziness, syncope, edema, weight gain, or early satiety.  Doesn't feel like he's had significant PVCs or arrhthymias in a couple of years.   Review of systems complete and found to be negative unless listed in HPI.   EP Information / Studies Reviewed:    EKG is ordered today. Personal review as below.  EKG Interpretation Date/Time:  Wednesday February 25 2024 09:59:10 EDT Ventricular Rate:  63 PR Interval:  168 QRS Duration:  80 QT Interval:  410 QTC Calculation: 419 R Axis:   76  Text Interpretation: Normal sinus rhythm Anterolateral infarct (cited on or before 04-Nov-2009) When compared with ECG of 17-Oct-2016 09:07, No significant change was found Confirmed by Pilar Bridge [phone]) on 02/25/2024 10:05:08 AM    Arrhythmia/Device History No specialty comments available.   Physical Exam:   VS:  BP 128/70   Pulse 63   Ht 5\' 10"  (1.778 m)   Wt 158 lb 9.6 oz (71.9 kg)   SpO2 93%   BMI 22.76 kg/m    Wt Readings from Last 3 Encounters:  02/25/24 158 lb 9.6 oz (71.9 kg)  03/21/23 157 lb (71.2 kg)  02/25/23 159 lb 3.2 oz (72.2 kg)     GEN: No acute distress NECK: No JVD; No carotid bruits CARDIAC: Regular rate and rhythm, no murmurs, rubs, gallops RESPIRATORY:  Clear to auscultation without rales, wheezing or rhonchi  ABDOMEN: Soft, non-tender, non-distended EXTREMITIES:  No edema; No deformity   ASSESSMENT AND PLAN:    Paroxysmal AF EKG today shows NSR Continue Eliquis 5 mg  BID for CHA2DS2/VASc of at least 4  PVCs Improved off Farxiga Continue Toprol 25 mg daily  CAD Denies s/s ischemia   Follow up with EP APP in 12 months  Signed, Tylene Galla, PA-C

## 2024-02-25 ENCOUNTER — Encounter: Payer: Self-pay | Admitting: Student

## 2024-02-25 ENCOUNTER — Other Ambulatory Visit (HOSPITAL_COMMUNITY): Payer: Self-pay

## 2024-02-25 ENCOUNTER — Ambulatory Visit: Attending: Student | Admitting: Student

## 2024-02-25 VITALS — BP 128/70 | HR 63 | Ht 70.0 in | Wt 158.6 lb

## 2024-02-25 DIAGNOSIS — I25709 Atherosclerosis of coronary artery bypass graft(s), unspecified, with unspecified angina pectoris: Secondary | ICD-10-CM

## 2024-02-25 DIAGNOSIS — I493 Ventricular premature depolarization: Secondary | ICD-10-CM

## 2024-02-25 DIAGNOSIS — I480 Paroxysmal atrial fibrillation: Secondary | ICD-10-CM

## 2024-02-25 NOTE — Patient Instructions (Signed)
 Medication Instructions:  Your physician recommends that you continue on your current medications as directed. Please refer to the Current Medication list given to you today.  *If you need a refill on your cardiac medications before your next appointment, please call your pharmacy*  Lab Work: None ordered If you have labs (blood work) drawn today and your tests are completely normal, you will receive your results only by: MyChart Message (if you have MyChart) OR A paper copy in the mail If you have any lab test that is abnormal or we need to change your treatment, we will call you to review the results.  Follow-Up: At [facility], you and your health needs are our priority.  As part of our continuing mission to provide you with exceptional heart care, our providers are all part of one team.  This team includes your primary Cardiologist (physician) and Advanced Practice Providers or APPs (Physician Assistants and Nurse Practitioners) who all work together to provide you with the care you need, when you need it.  Your next appointment:   1 year(s)  Provider:   You will see one of the following Advanced Practice Providers on your designated Care Team:   Francis Dowse, [place] Casimiro Needle "Mardelle Matte" Tillery, PA-C Canary Brim, NP      1st Floor: - Lobby - Registration  - Pharmacy  - Lab - Cafe  2nd Floor: - PV Lab - Diagnostic Testing (echo, CT, nuclear med)  3rd Floor: - Vacant  4th Floor: - TCTS (cardiothoracic surgery) - AFib Clinic - Structural Heart Clinic - Vascular Surgery  - Vascular Ultrasound  5th Floor: - HeartCare Cardiology (general and EP) - Clinical Pharmacy for coumadin, hypertension, lipid, weight-loss medications, and med management appointments    Valet parking services will be available as well.

## 2024-03-15 ENCOUNTER — Ambulatory Visit: Payer: Medicare Other | Attending: Cardiology | Admitting: Cardiology

## 2024-03-15 VITALS — BP 126/78 | HR 60 | Ht 70.0 in | Wt 157.6 lb

## 2024-03-15 DIAGNOSIS — R072 Precordial pain: Secondary | ICD-10-CM

## 2024-03-15 NOTE — Progress Notes (Signed)
 Cardiology Office Note:  .   Date:  03/15/2024  ID:  Isaac [name], DOB [birth year], MRN [id number] PCP: Isaac Bradley, MD  [facility] Providers Cardiologist:  Isaac Albany, MD (Inactive) Electrophysiologist:  Isaac Sells, MD     History of Present Illness: Isaac [name]   Isaac [name] is a 73 y.o. male Discussed the use of AI scribe software for clinical note transcription with the patient, who gave verbal consent to proceed.  History of Present Illness Isaac [name] is a 73 year old male with paroxysmal atrial fibrillation who presents for follow-up. He is accompanied by his wife, Isaac [name].  He experiences occasional chest pain about twice a week, described as a 'tiny pain' either centered or slightly to the left, rated as a 2 or 3 on a 10-point scale. He also has 'chest anxiety moments' with a feeling of tightness across the chest. He is unsure of the causation, noting that sometimes there is a reason for anxiety, but other times there is not.  He has a history of coronary artery disease and underwent bypass surgery in December 2010 following an anterior myocardial infarction. He previously failed Tikosyn  and had an ablation for atrial fibrillation in 2017. His last echocardiogram in 2023 showed an ejection fraction of 40 to 45 percent. An event monitor showed 9% PVCs, which improved after stopping Farxiga .  He is currently on Eliquis  5 mg twice a day, losartan  25 mg daily, Toprol  XL 25 mg daily, and Crestor  5 mg daily. His blood pressure has been running in the 120s over the past year, whereas it used to be in the 106s for the past ten years.  He engages in physical activity, including riding an exercise bike and walking, without significant shortness of breath. He lives in a two-story house and occasionally feels out of breath after climbing stairs, but this has been a long-standing issue. He follows a Mediterranean diet and enjoys retirement activities such as walking, playing  piano, and yard work.  Retired Isaac [name]      Studies Reviewed: .        Results DIAGNOSTIC EKG: Sinus rhythm (03/15/2024) Echocardiogram: EF 40-45% (2023) Event monitor: 9% PVCs Risk Assessment/Calculations:            Physical Exam:   VS:  BP 126/78 (BP Location: Right Arm)   Pulse 60   Ht 5\' 10"  (1.778 m)   Wt 157 lb 9.6 oz (71.5 kg)   SpO2 97%   BMI 22.61 kg/m    Wt Readings from Last 3 Encounters:  03/15/24 157 lb 9.6 oz (71.5 kg)  02/25/24 158 lb 9.6 oz (71.9 kg)  03/21/23 157 lb (71.2 kg)    GEN: Well nourished, well developed in no acute distress NECK: No JVD; No carotid bruits CARDIAC: RRR, no murmurs, no rubs, no gallops RESPIRATORY:  Clear to auscultation without rales, wheezing or rhonchi  ABDOMEN: Soft, non-tender, non-distended EXTREMITIES:  No edema; No deformity   ASSESSMENT AND PLAN: .    Assessment and Plan Assessment & Plan Coronary artery disease with history of bypass Intermittent chest pain rated 2-3/10, likely musculoskeletal. Coronary artery disease with bypass surgery in December 2010 post-anterior myocardial infarction. Last stress test in 2017. Discussed potential vein graft closure over time and compensatory blood flow reworking. Current symptoms do not indicate acute coronary syndrome, but warrant stress test to evaluate flow dynamics and concerns. Explained cardiac stress PET test as noninvasive, providing flow dynamics and concern areas,  while heart catheterization offers anatomical details and is more invasive. Stress test preferred unless symptoms significantly worsen. - Order cardiac stress PET test to evaluate coronary flow dynamics and assess for any areas of concern.  Paroxysmal atrial fibrillation Paroxysmal atrial fibrillation, previously failed Tikosyn  and underwent ablation in 2017. Currently on Eliquis  5 mg twice daily. No acute issues during this visit.  Premature ventricular contractions (PVCs) Premature ventricular  contractions improved after discontinuation of Farxiga . Event monitor showed 9% PVCs. No acute management changes discussed.       Informed Consent   Shared Decision Making/Informed Consent The risks [chest pain, shortness of breath, cardiac arrhythmias, dizziness, blood pressure fluctuations, myocardial infarction, stroke/transient ischemic attack, nausea, vomiting, allergic reaction, radiation exposure, metallic taste sensation and life-threatening complications (estimated to be 1 in 10,000)], benefits (risk stratification, diagnosing coronary artery disease, treatment guidance) and alternatives of a cardiac PET stress test were discussed in detail with Isaac [name] and he agrees to proceed.     Dispo: Will follow up with PET stress.   Signed, Isaac Gathers, MD

## 2024-03-15 NOTE — Patient Instructions (Signed)
 Medication Instructions:  The current medical regimen is effective;  continue present plan and medications.  *If you need a refill on your cardiac medications before your next appointment, please call your pharmacy*  Testing/Procedures:    Please report to Radiology at the [facility] Main Entrance 30 minutes early for your test.  [address] [place], [place] [address]  How to Prepare for Your Cardiac PET/CT Stress Test:  Nothing to eat or drink, except water, 3 hours prior to arrival time.  NO caffeine/decaffeinated products, or chocolate 12 hours prior to arrival. (Please note decaffeinated beverages (teas/coffees) still contain caffeine).  If you have caffeine within 12 hours prior, the test will need to be rescheduled.  Medication instructions: Do not take erectile dysfunction medications for 72 hours prior to test (sildenafil, tadalafil) Do not take nitrates (isosorbide mononitrate, Ranexa) the day before or day of test Do not take tamsulosin  the day before or morning of test Hold theophylline containing medications for 12 hours. Hold Dipyridamole 48 hours prior to the test.  Diabetic Preparation: If able to eat breakfast prior to 3 hour fasting, you may take all medications, including your insulin. Do not worry if you miss your breakfast dose of insulin - start at your next meal. If you do not eat prior to 3 hour fast-Hold all diabetes (oral and insulin) medications. Patients who wear a continuous glucose monitor MUST remove the device prior to scanning.  You may take your remaining medications with water.  NO perfume, cologne or lotion on chest or abdomen area. FEMALES - Please avoid wearing dresses to this appointment.  Total time is 1 to 2 hours; you may want to bring reading material for the waiting time.  IF YOU THINK YOU MAY BE PREGNANT, OR ARE NURSING PLEASE INFORM THE TECHNOLOGIST.  In preparation for your appointment, medication and supplies will be  purchased.  Appointment availability is limited, so if you need to cancel or reschedule, please call the Radiology Department Scheduler at [phone] 24 hours in advance to avoid a cancellation fee of $100.00  What to Expect When you Arrive:  Once you arrive and check in for your appointment, you will be taken to a preparation room within the Radiology Department.  A technologist or Nurse will obtain your medical history, verify that you are correctly prepped for the exam, and explain the procedure.  Afterwards, an IV will be started in your arm and electrodes will be placed on your skin for EKG monitoring during the stress portion of the exam. Then you will be escorted to the PET/CT scanner.  There, staff will get you positioned on the scanner and obtain a blood pressure and EKG.  During the exam, you will continue to be connected to the EKG and blood pressure machines.  A small, safe amount of a radioactive tracer will be injected in your IV to obtain a series of pictures of your heart along with an injection of a stress agent.    After your Exam:  It is recommended that you eat a meal and drink a caffeinated beverage to counter act any effects of the stress agent.  Drink plenty of fluids for the remainder of the day and urinate frequently for the first couple of hours after the exam.  Your doctor will inform you of your test results within 7-10 business days.  For more information and frequently asked questions, please visit our website: [email or web address]  For questions about your test or how to prepare  for your test, please call: Cardiac Imaging Nurse Navigators Office: [phone]   Follow-Up: At [facility], you and your health needs are our priority.  As part of our continuing mission to provide you with exceptional heart care, our providers are all part of one team.  This team includes your primary Cardiologist (physician) and Advanced Practice Providers or  APPs (Physician Assistants and Nurse Practitioners) who all work together to provide you with the care you need, when you need it.  Your next appointment:   1 year(s)  Provider:   Dr Dorothye Gathers    We recommend signing up for the patient portal called "MyChart".  Sign up information is provided on this After Visit Summary.  MyChart is used to connect with patients for Virtual Visits (Telemedicine).  Patients are able to view lab/test results, encounter notes, upcoming appointments, etc.  Non-urgent messages can be sent to your provider as well.   To learn more about what you can do with MyChart, go to [email or web address].

## 2024-03-22 ENCOUNTER — Other Ambulatory Visit: Payer: Self-pay | Admitting: Cardiology

## 2024-03-22 ENCOUNTER — Other Ambulatory Visit (HOSPITAL_COMMUNITY): Payer: Self-pay

## 2024-03-22 MED ORDER — LOSARTAN POTASSIUM 25 MG PO TABS
25.0000 mg | ORAL_TABLET | Freq: Every day | ORAL | 3 refills | Status: DC
  Filled 2024-03-22: qty 90, 90d supply, fill #0
  Filled 2024-06-20: qty 90, 90d supply, fill #1

## 2024-03-23 ENCOUNTER — Ambulatory Visit: Payer: Medicare Other | Admitting: Podiatry

## 2024-04-16 DIAGNOSIS — R3915 Urgency of urination: Secondary | ICD-10-CM | POA: Diagnosis not present

## 2024-04-16 DIAGNOSIS — D6869 Other thrombophilia: Secondary | ICD-10-CM | POA: Diagnosis not present

## 2024-04-16 DIAGNOSIS — R1030 Lower abdominal pain, unspecified: Secondary | ICD-10-CM | POA: Diagnosis not present

## 2024-04-16 DIAGNOSIS — R1032 Left lower quadrant pain: Secondary | ICD-10-CM | POA: Diagnosis not present

## 2024-04-19 ENCOUNTER — Other Ambulatory Visit (HOSPITAL_COMMUNITY): Payer: Self-pay

## 2024-04-19 ENCOUNTER — Ambulatory Visit: Admitting: Podiatry

## 2024-04-19 ENCOUNTER — Encounter: Payer: Self-pay | Admitting: Podiatry

## 2024-04-19 DIAGNOSIS — L84 Corns and callosities: Secondary | ICD-10-CM

## 2024-04-19 DIAGNOSIS — Z7901 Long term (current) use of anticoagulants: Secondary | ICD-10-CM | POA: Diagnosis not present

## 2024-04-19 MED ORDER — DOXYCYCLINE HYCLATE 100 MG PO TABS
100.0000 mg | ORAL_TABLET | Freq: Two times a day (BID) | ORAL | 0 refills | Status: AC
  Filled 2024-04-19: qty 28, 14d supply, fill #0

## 2024-04-19 NOTE — Progress Notes (Signed)
  Subjective:  Patient ID: Isaac [name], male    DOB: [birth year],   MRN: [id number]  Chief Complaint  Patient presents with   Routine Foot Care    Rm 21/ RFC not diabetic/blood thinner    73 y.o. male presents for concern of calluses and corns on both of his feet as above. He is on blood thinners and at risk for foot care.  . Denies any other pedal complaints. Denies n/v/f/c.   Past Medical History:  Diagnosis Date   CERVICAL SPINE DISORDER, NOS    Chronic systolic (congestive) heart failure (HCC)    EF 40%   Coronary artery disease    a. 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 CFT <3 seconds. Normal hair growth on digits. No edema.  Skin. No lacerations or abrasions bilateral feet.  Hyperkeratotic tissue noted plantar second and third and fifth metatarsal on the right and third metatarsal head on the left.  Musculoskeletal: MMT 5/5 bilateral lower extremities in DF, PF, Inversion and Eversion. Deceased ROM in DF of ankle joint.  Neurological: Sensation intact to light touch.   Assessment:   1. Corn or callus   2. Chronic anticoagulation      Plan:  Patient was evaluated and treated and all questions answered. -Hyperkeratotic tissue plantar second and third and fifth metatarsal on the right and third metatarsal on the left was debrided with chisel without incident x4.  -Applied salycylic acid treatment to area with dressing. Advised to remove bandaging tomorrow.  -Encouraged daily moisturizing -Discussed use of pumice stone -Advised good supportive shoes and inserts -Answered all patient questions -Patient to return  in 3 months for at risk foot care -Patient advised to call the office if any problems or  questions arise in the meantime.   Jennefer Moats, DPM

## 2024-05-03 ENCOUNTER — Other Ambulatory Visit (HOSPITAL_COMMUNITY): Payer: Self-pay

## 2024-05-16 ENCOUNTER — Other Ambulatory Visit: Payer: Self-pay | Admitting: Cardiology

## 2024-05-17 ENCOUNTER — Other Ambulatory Visit (HOSPITAL_COMMUNITY): Payer: Self-pay

## 2024-05-17 MED ORDER — METOPROLOL SUCCINATE ER 25 MG PO TB24
25.0000 mg | ORAL_TABLET | Freq: Every day | ORAL | 3 refills | Status: AC
  Filled 2024-05-17: qty 90, 90d supply, fill #0
  Filled 2024-08-16: qty 90, 90d supply, fill #1
  Filled 2024-11-12: qty 90, 90d supply, fill #2

## 2024-05-24 ENCOUNTER — Other Ambulatory Visit (HOSPITAL_COMMUNITY): Payer: Self-pay

## 2024-06-01 DIAGNOSIS — R319 Hematuria, unspecified: Secondary | ICD-10-CM | POA: Diagnosis not present

## 2024-06-03 ENCOUNTER — Other Ambulatory Visit (HOSPITAL_COMMUNITY): Payer: Self-pay

## 2024-06-07 ENCOUNTER — Other Ambulatory Visit (HOSPITAL_COMMUNITY): Payer: Self-pay

## 2024-06-14 ENCOUNTER — Encounter (HOSPITAL_COMMUNITY): Payer: Self-pay

## 2024-06-15 ENCOUNTER — Encounter
Admission: RE | Admit: 2024-06-15 | Discharge: 2024-06-15 | Disposition: A | Discharge status: Home / Self Care | Source: Ambulatory Visit | Attending: Cardiology | Admitting: Cardiology

## 2024-06-15 DIAGNOSIS — R072 Precordial pain: Secondary | ICD-10-CM | POA: Insufficient documentation

## 2024-06-15 LAB — NM PET CT CARDIAC PERFUSION MULTI W/ABSOLUTE BLOODFLOW
MBFR: 2.11
Nuc Rest EF: 44 %
Nuc Stress EF: 46 %
Peak HR: 85 {beats}/min
Rest HR: 67 {beats}/min
Rest MBF: 0.61 ml/g/min
Rest Nuclear Isotope Dose: 18.5 mCi
SRS: 12
SSS: 30
ST Depression (mm): 0 mm
Stress MBF: 1.29 ml/g/min
Stress Nuclear Isotope Dose: 18.5 mCi
TID: 0.97

## 2024-06-15 MED ORDER — REGADENOSON 0.4 MG/5ML IV SOLN
0.4000 mg | Freq: Once | INTRAVENOUS | Status: AC
  Administered 2024-06-15: 0.4 mg via INTRAVENOUS
  Filled 2024-06-15: qty 5

## 2024-06-15 MED ORDER — RUBIDIUM RB82 GENERATOR (RUBYFILL)
25.0000 | PACK | Freq: Once | INTRAVENOUS | Status: AC
  Administered 2024-06-15: 18.48 via INTRAVENOUS

## 2024-06-15 NOTE — Progress Notes (Signed)
 Pt tolerated lexiscan . C/o some SOB, close to baseline. Resolved at end of scan with minor c/o HA. IV removed and PO caffeine given.

## 2024-06-16 ENCOUNTER — Ambulatory Visit: Admission: RE | Admit: 2024-06-16 | Source: Ambulatory Visit

## 2024-06-16 ENCOUNTER — Inpatient Hospital Stay (HOSPITAL_COMMUNITY): Admission: RE | Admit: 2024-06-16 | Source: Ambulatory Visit

## 2024-06-17 DIAGNOSIS — I480 Paroxysmal atrial fibrillation: Secondary | ICD-10-CM | POA: Diagnosis not present

## 2024-06-17 DIAGNOSIS — Z79899 Other long term (current) drug therapy: Secondary | ICD-10-CM | POA: Diagnosis not present

## 2024-06-17 DIAGNOSIS — R946 Abnormal results of thyroid function studies: Secondary | ICD-10-CM | POA: Diagnosis not present

## 2024-06-17 DIAGNOSIS — I2510 Atherosclerotic heart disease of native coronary artery without angina pectoris: Secondary | ICD-10-CM | POA: Diagnosis not present

## 2024-06-17 DIAGNOSIS — Z0000 Encounter for general adult medical examination without abnormal findings: Secondary | ICD-10-CM | POA: Diagnosis not present

## 2024-06-17 DIAGNOSIS — D6869 Other thrombophilia: Secondary | ICD-10-CM | POA: Diagnosis not present

## 2024-06-17 DIAGNOSIS — R1030 Lower abdominal pain, unspecified: Secondary | ICD-10-CM | POA: Diagnosis not present

## 2024-06-17 DIAGNOSIS — I5042 Chronic combined systolic (congestive) and diastolic (congestive) heart failure: Secondary | ICD-10-CM | POA: Diagnosis not present

## 2024-06-17 DIAGNOSIS — E559 Vitamin D deficiency, unspecified: Secondary | ICD-10-CM | POA: Diagnosis not present

## 2024-06-19 ENCOUNTER — Encounter: Payer: Self-pay | Admitting: Cardiology

## 2024-06-21 ENCOUNTER — Ambulatory Visit: Payer: Self-pay | Admitting: Cardiology

## 2024-06-21 NOTE — Telephone Encounter (Signed)
 Patient is returning call.

## 2024-06-29 ENCOUNTER — Encounter: Payer: Self-pay | Admitting: Cardiology

## 2024-06-29 DIAGNOSIS — R3982 Chronic bladder pain: Secondary | ICD-10-CM | POA: Diagnosis not present

## 2024-06-29 DIAGNOSIS — M545 Low back pain, unspecified: Secondary | ICD-10-CM | POA: Diagnosis not present

## 2024-07-07 ENCOUNTER — Ambulatory Visit: Attending: Cardiology | Admitting: Cardiology

## 2024-07-07 ENCOUNTER — Encounter: Payer: Self-pay | Admitting: Cardiology

## 2024-07-07 VITALS — BP 118/72 | HR 62 | Ht 70.0 in | Wt 159.4 lb

## 2024-07-07 DIAGNOSIS — R072 Precordial pain: Secondary | ICD-10-CM | POA: Diagnosis not present

## 2024-07-07 DIAGNOSIS — Z01812 Encounter for preprocedural laboratory examination: Secondary | ICD-10-CM | POA: Diagnosis not present

## 2024-07-07 DIAGNOSIS — I480 Paroxysmal atrial fibrillation: Secondary | ICD-10-CM | POA: Diagnosis not present

## 2024-07-07 DIAGNOSIS — R9439 Abnormal result of other cardiovascular function study: Secondary | ICD-10-CM

## 2024-07-07 DIAGNOSIS — Z01810 Encounter for preprocedural cardiovascular examination: Secondary | ICD-10-CM | POA: Diagnosis not present

## 2024-07-07 DIAGNOSIS — I25709 Atherosclerosis of coronary artery bypass graft(s), unspecified, with unspecified angina pectoris: Secondary | ICD-10-CM | POA: Diagnosis not present

## 2024-07-07 NOTE — Progress Notes (Signed)
 Cardiology Office Note:  .   Date:  07/07/2024  ID:  Isaac [name], DOB January 14, 1951, MRN [id number] PCP: Charlott Dorn LABOR, MD  [facility] Providers Cardiologist:  Oneil Parchment, MD Electrophysiologist:  Danelle Birmingham, MD     History of Present Illness: Isaac   YONAEL [name] is a 73 y.o. male Discussed the use of AI scribe software for clinical note transcription with the patient, who gave verbal consent to proceed.  History of Present Illness Isaac [name] is a 73 year old male with coronary artery disease and prior coronary bypass surgery who presents for evaluation of peri-infarct ischemia and potential bypass graft issues. He is accompanied by his wife, Isaac [name].  He has a history of coronary artery disease and underwent coronary artery bypass grafting in December 2010 (Dr. Dusty LIMA LAD, SVG to Diag) following an anterior myocardial infarction. A recent cardiac PET scan performed on June 15, 2024, identified peri-infarct ischemia. The scan showed a defect in the midapical anterior/septal segment, and the defect worsened at stress. The flow reserve in the territory was abnormal at 1.77, consistent with ischemia. The rest ejection fraction (EF) was 40%, and the stress EF was 46%.  He has a history of paroxysmal atrial fibrillation, for which he previously failed Tikosyn  and underwent ablation in 2017. He is currently on Eliquis  5 mg twice a day. He also experiences premature ventricular contractions (PVCs), which improved after discontinuation of Farxiga . An event monitor previously showed 9% PVCs.  He occasionally experiences shortness of breath when climbing stairs, which he describes as a longstanding issue. He reports a recent episode of lightheadedness while sitting at lunch and occasionally feels lightheaded when standing up quickly or after bending over while gardening. He notes that his blood pressure has been stable, sometimes on the lower side, and he is on losartan ,  metoprolol , and Crestor .  He enjoys retirement and spends time playing the piano. 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      Studies Reviewed: Isaac   EKG Interpretation Date/Time:  Wednesday July 07 2024 13:13:16 EDT Ventricular Rate:  62 PR Interval:  156 QRS Duration:  84 QT Interval:  412 QTC Calculation: 418 R Axis:   75  Text Interpretation: Normal sinus rhythm Anterolateral infarct (cited on or before 04-Nov-2009) When compared with ECG of 25-Feb-2024 09:59, No significant change was found Confirmed by Parchment Oneil ([id number]) on 07/07/2024 1:18:24 PM    Results RADIOLOGY Cardiac PET: Defect in midapical anterior/septal segment consistent with LAD infarction, worsened at stress indicating peri-infarct ischemia. Abnormal flow reserve at 1.77. Rest EF 40%, Stress EF 46%. (06/15/2024) Risk Assessment/Calculations:            Physical Exam:   VS:  BP 118/72   Pulse 62   Ht 5' 10 (1.778 m)   Wt 159 lb 6.4 oz (72.3 kg)   SpO2 97%   BMI 22.87 kg/m    Wt Readings from Last 3 Encounters:  07/07/24 159 lb 6.4 oz (72.3 kg)  03/15/24 157 lb 9.6 oz (71.5 kg)  02/25/24 158 lb 9.6 oz (71.9 kg)    GEN: Well nourished, well developed in no acute distress NECK: No JVD; No carotid bruits CARDIAC: RRR, no murmurs, no rubs, no gallops RESPIRATORY:  Clear to auscultation without rales, wheezing or rhonchi  ABDOMEN: Soft, non-tender, non-distended EXTREMITIES:  No edema; No deformity   ASSESSMENT AND PLAN: .    Assessment and Plan Assessment & Plan Peri-infarct ischemia in LAD  territory with prior coronary artery bypass grafting Cardiac PET scan on June 15, 2024, revealed a defect in the midapical anterior/septal segment consistent with LAD infarction, with worsening at stress indicating peri-infarct ischemia. The flow reserve was abnormal at 1.77, suggesting high-risk ischemia. Concerns include potential issues with the bypass grafts from the coronary artery bypass surgery in December  2010, possibly involving a malfunctioning saphenous vein graft to the diagonal branch. - Hold Eliquis  for 2 days prior to heart catheterization. - Schedule heart catheterization within the next week or two to evaluate bypass grafts. - Discuss potential need for stenting if a bypass is not functioning, noting that redoing bypass surgery is exceedingly rare. - Consider additional medications or adjustments if stenting is not possible and symptoms persist.  Paroxysmal atrial fibrillation Paroxysmal atrial fibrillation previously managed unsuccessfully with Tikosyn  and ablation in 2017. - Continue Eliquis  5 mg twice a day.  Premature ventricular contractions (PVCs) PVCs improved after discontinuation of Farxiga . Previous event monitor showed 9% PVCs.  Follow-Up Heart catheterization to be scheduled within the next week or two, with no urgency for immediate intervention. - Schedule heart catheterization within the next week or two.       Informed Consent   Shared Decision Making/Informed Consent The risks [stroke (1 in 1000), death (1 in 1000), kidney failure [usually temporary] (1 in 500), bleeding (1 in 200), allergic reaction [possibly serious] (1 in 200)], benefits (diagnostic support and management of coronary artery disease) and alternatives of a cardiac catheterization were discussed in detail with Isaac [name] and he is willing to proceed.     Dispo: post cath  Signed, Oneil Parchment, MD

## 2024-07-07 NOTE — H&P (View-Only) (Signed)
 Cardiology Office Note:  .   Date:  07/07/2024  ID:  RENARD [name], DOB January 14, 1951, MRN [id number] PCP: Charlott Dorn LABOR, MD  [facility] Providers Cardiologist:  Oneil Parchment, MD Electrophysiologist:  Danelle Birmingham, MD     History of Present Illness: Isaac   YONAEL [name] is a 73 y.o. male Discussed the use of AI scribe software for clinical note transcription with the patient, who gave verbal consent to proceed.  History of Present Illness Isaac [name] is a 73 year old male with coronary artery disease and prior coronary bypass surgery who presents for evaluation of peri-infarct ischemia and potential bypass graft issues. He is accompanied by his wife, Isaac [name].  He has a history of coronary artery disease and underwent coronary artery bypass grafting in December 2010 (Dr. Dusty LIMA LAD, SVG to Diag) following an anterior myocardial infarction. A recent cardiac PET scan performed on June 15, 2024, identified peri-infarct ischemia. The scan showed a defect in the midapical anterior/septal segment, and the defect worsened at stress. The flow reserve in the territory was abnormal at 1.77, consistent with ischemia. The rest ejection fraction (EF) was 40%, and the stress EF was 46%.  He has a history of paroxysmal atrial fibrillation, for which he previously failed Tikosyn  and underwent ablation in 2017. He is currently on Eliquis  5 mg twice a day. He also experiences premature ventricular contractions (PVCs), which improved after discontinuation of Farxiga . An event monitor previously showed 9% PVCs.  He occasionally experiences shortness of breath when climbing stairs, which he describes as a longstanding issue. He reports a recent episode of lightheadedness while sitting at lunch and occasionally feels lightheaded when standing up quickly or after bending over while gardening. He notes that his blood pressure has been stable, sometimes on the lower side, and he is on losartan ,  metoprolol , and Crestor .  He enjoys retirement and spends time playing the piano. 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      Studies Reviewed: Isaac   EKG Interpretation Date/Time:  Wednesday July 07 2024 13:13:16 EDT Ventricular Rate:  62 PR Interval:  156 QRS Duration:  84 QT Interval:  412 QTC Calculation: 418 R Axis:   75  Text Interpretation: Normal sinus rhythm Anterolateral infarct (cited on or before 04-Nov-2009) When compared with ECG of 25-Feb-2024 09:59, No significant change was found Confirmed by Parchment Oneil ([id number]) on 07/07/2024 1:18:24 PM    Results RADIOLOGY Cardiac PET: Defect in midapical anterior/septal segment consistent with LAD infarction, worsened at stress indicating peri-infarct ischemia. Abnormal flow reserve at 1.77. Rest EF 40%, Stress EF 46%. (06/15/2024) Risk Assessment/Calculations:            Physical Exam:   VS:  BP 118/72   Pulse 62   Ht 5' 10 (1.778 m)   Wt 159 lb 6.4 oz (72.3 kg)   SpO2 97%   BMI 22.87 kg/m    Wt Readings from Last 3 Encounters:  07/07/24 159 lb 6.4 oz (72.3 kg)  03/15/24 157 lb 9.6 oz (71.5 kg)  02/25/24 158 lb 9.6 oz (71.9 kg)    GEN: Well nourished, well developed in no acute distress NECK: No JVD; No carotid bruits CARDIAC: RRR, no murmurs, no rubs, no gallops RESPIRATORY:  Clear to auscultation without rales, wheezing or rhonchi  ABDOMEN: Soft, non-tender, non-distended EXTREMITIES:  No edema; No deformity   ASSESSMENT AND PLAN: .    Assessment and Plan Assessment & Plan Peri-infarct ischemia in LAD  territory with prior coronary artery bypass grafting Cardiac PET scan on June 15, 2024, revealed a defect in the midapical anterior/septal segment consistent with LAD infarction, with worsening at stress indicating peri-infarct ischemia. The flow reserve was abnormal at 1.77, suggesting high-risk ischemia. Concerns include potential issues with the bypass grafts from the coronary artery bypass surgery in December  2010, possibly involving a malfunctioning saphenous vein graft to the diagonal branch. - Hold Eliquis  for 2 days prior to heart catheterization. - Schedule heart catheterization within the next week or two to evaluate bypass grafts. - Discuss potential need for stenting if a bypass is not functioning, noting that redoing bypass surgery is exceedingly rare. - Consider additional medications or adjustments if stenting is not possible and symptoms persist.  Paroxysmal atrial fibrillation Paroxysmal atrial fibrillation previously managed unsuccessfully with Tikosyn  and ablation in 2017. - Continue Eliquis  5 mg twice a day.  Premature ventricular contractions (PVCs) PVCs improved after discontinuation of Farxiga . Previous event monitor showed 9% PVCs.  Follow-Up Heart catheterization to be scheduled within the next week or two, with no urgency for immediate intervention. - Schedule heart catheterization within the next week or two.       Informed Consent   Shared Decision Making/Informed Consent The risks [stroke (1 in 1000), death (1 in 1000), kidney failure [usually temporary] (1 in 500), bleeding (1 in 200), allergic reaction [possibly serious] (1 in 200)], benefits (diagnostic support and management of coronary artery disease) and alternatives of a cardiac catheterization were discussed in detail with Isaac [name] and he is willing to proceed.     Dispo: post cath  Signed, Oneil Parchment, MD

## 2024-07-07 NOTE — Patient Instructions (Addendum)
 Medication Instructions:  The current medical regimen is effective;  continue present plan and medications.  *If you need a refill on your cardiac medications before your next appointment, please call your pharmacy*  Lab Work: [facility] before cath  If you have labs (blood work) drawn today and your tests are completely normal, you will receive your results only by: MyChart Message (if you have MyChart) OR A paper copy in the mail If you have any lab test that is abnormal or we need to change your treatment, we will call you to review the results.  Testing/Procedures: Cardiac Cath scheduled Wed 9/3 at [facility] Arrive at 9:30 am   Follow instructions below  Follow-Up: At [facility], you and your health needs are our priority.  As part of our continuing mission to provide you with exceptional heart care, our providers are all part of one team.  This team includes your primary Cardiologist (physician) and Advanced Practice Providers or APPs (Physician Assistants and Nurse Practitioners) who all work together to provide you with the care you need, when you need it.  Your next appointment:   1 year(s)  Provider:   Oneil Parchment, MD      [facility] A DEPT OF [facility] [address] A DEPT OF THE [facility] [address] [place] [address] Dept: [phone] Loc: [phone]  Isaac [name]  07/07/2024  You are scheduled for a Cardiac Catheterization on Wednesday, September 3 with Dr. Peter Swaziland.  1. Please arrive at the [facility] (Main Entrance A) at [facility]: [address] [place], [place] [address] at 9:30 AM (This time is 2 hour(s) before your procedure to ensure your preparation).   Free valet parking service is available. You will check in at ADMITTING. The support person will be asked to wait in the waiting room.  It is OK to have someone drop you off and come back when you are ready to be  discharged.    Special note: Every effort is made to have your procedure done on time. Please understand that emergencies sometimes delay scheduled procedures.  2. Diet: Nothing to eat after midnight.   3. Hydration: You need to be well hydrated before your procedure. On September 3, you may drink approved liquids (see below) until 2 hours before the procedure, with 16 oz of water as your last intake.   List of approved liquids water, clear juice, clear tea, black coffee, fruit juices, non-citric and without pulp, carbonated beverages, Gatorade, Kool -Aid, plain Jello-O and plain ice popsicles.  4. Labs: You will need to have blood drawn bmet,cbc  today 8/27 You do not need to be fasting.  5. Medication instructions in preparation for your procedure:   Hold Eliquis  2 days before      On the morning of your procedure, take your Aspirin  81 mg and any morning medicines NOT listed above.  You may use sips of water.  6. Plan to go home the same day, you will only stay overnight if medically necessary. 7. Bring a current list of your medications and current insurance cards. 8. You MUST have a responsible person to drive you home. 9. Someone MUST be with you the first 24 hours after you arrive home or your discharge will be delayed. 10. Please wear clothes that are easy to get on and off and wear slip-on shoes.  Thank you for allowing us  to care  for you!   -- [facility]   We recommend signing up for the patient portal called MyChart.  Sign up information is provided on this After Visit Summary.  MyChart is used to connect with patients for Virtual Visits (Telemedicine).  Patients are able to view lab/test results, encounter notes, upcoming appointments, etc.  Non-urgent messages can be sent to your provider as well.   To learn more about what you can do with MyChart, go to [email or web address].

## 2024-07-08 ENCOUNTER — Ambulatory Visit: Payer: Self-pay | Admitting: *Deleted

## 2024-07-08 LAB — CBC WITH DIFFERENTIAL/PLATELET
Basophils Absolute: 0 x10E3/uL (ref 0.0–0.2)
Basos: 1 %
EOS (ABSOLUTE): 0.2 x10E3/uL (ref 0.0–0.4)
Eos: 4 %
Hematocrit: 46.4 % (ref 37.5–51.0)
Hemoglobin: 15.3 g/dL (ref 13.0–17.7)
Immature Grans (Abs): 0 x10E3/uL (ref 0.0–0.1)
Immature Granulocytes: 0 %
Lymphocytes Absolute: 1.4 x10E3/uL (ref 0.7–3.1)
Lymphs: 26 %
MCH: 31.2 pg (ref 26.6–33.0)
MCHC: 33 g/dL (ref 31.5–35.7)
MCV: 95 fL (ref 79–97)
Monocytes Absolute: 0.5 x10E3/uL (ref 0.1–0.9)
Monocytes: 9 %
Neutrophils Absolute: 3.2 x10E3/uL (ref 1.4–7.0)
Neutrophils: 60 %
Platelets: 220 x10E3/uL (ref 150–450)
RBC: 4.91 x10E6/uL (ref 4.14–5.80)
RDW: 11.7 % (ref 11.6–15.4)
WBC: 5.4 x10E3/uL (ref 3.4–10.8)

## 2024-07-08 LAB — BASIC METABOLIC PANEL WITH GFR
BUN/Creatinine Ratio: 19 (ref 10–24)
BUN: 18 mg/dL (ref 8–27)
CO2: 22 mmol/L (ref 20–29)
Calcium: 9.6 mg/dL (ref 8.6–10.2)
Chloride: 106 mmol/L (ref 96–106)
Creatinine, Ser: 0.93 mg/dL (ref 0.76–1.27)
Glucose: 85 mg/dL (ref 70–99)
Potassium: 4.2 mmol/L (ref 3.5–5.2)
Sodium: 140 mmol/L (ref 134–144)
eGFR: 86 mL/min/1.73 (ref >59)

## 2024-07-09 DIAGNOSIS — K5730 Diverticulosis of large intestine without perforation or abscess without bleeding: Secondary | ICD-10-CM | POA: Diagnosis not present

## 2024-07-13 ENCOUNTER — Telehealth: Payer: Self-pay | Admitting: *Deleted

## 2024-07-13 NOTE — Telephone Encounter (Signed)
 Reviewed procedure instructions with patient.

## 2024-07-13 NOTE — Telephone Encounter (Addendum)
 Cardiac Catheterization scheduled at [facility] for: Wednesday July 14, 2024 11:30 AM Arrival time [facility] Main Entrance A at: 9:30 AM  Diet: -Nothing to eat after midnight.  Hydration: -May drink clear liquids until 2 hours before the procedure.  Approved liquids: Water , clear tea, black coffee, fruit juices-non-citric and without pulp,Gatorade, plain Jello/popsicles.   -Please drink 16 oz bottle of water  2 hours before procedure.  Medication instructions: -Hold:  Eliquis -none 07/12/24 until post procedure  -Other usual morning medications can be taken including aspirin  81 mg.  Plan to go home the same day, you will only stay overnight if medically necessary.  You must have responsible adult to drive you home.  Someone must be with you the first 24 hours after you arrive home.  Left message for patient to call back to review procedure instructions.

## 2024-07-13 NOTE — Telephone Encounter (Signed)
 Patient returned RN's call.

## 2024-07-14 ENCOUNTER — Other Ambulatory Visit: Payer: Self-pay

## 2024-07-14 ENCOUNTER — Encounter (HOSPITAL_COMMUNITY)
Admission: RE | Disposition: A | Discharge status: Home / Self Care | Payer: Self-pay | Source: Home / Self Care | Attending: Cardiology

## 2024-07-14 ENCOUNTER — Ambulatory Visit (HOSPITAL_COMMUNITY)
Admission: RE | Admit: 2024-07-14 | Discharge: 2024-07-14 | Disposition: A | Discharge status: Home / Self Care | Attending: Cardiology | Admitting: Cardiology

## 2024-07-14 ENCOUNTER — Encounter (HOSPITAL_COMMUNITY): Payer: Self-pay | Admitting: Cardiology

## 2024-07-14 DIAGNOSIS — I480 Paroxysmal atrial fibrillation: Secondary | ICD-10-CM | POA: Diagnosis not present

## 2024-07-14 DIAGNOSIS — Z7901 Long term (current) use of anticoagulants: Secondary | ICD-10-CM | POA: Diagnosis not present

## 2024-07-14 DIAGNOSIS — I2582 Chronic total occlusion of coronary artery: Secondary | ICD-10-CM | POA: Insufficient documentation

## 2024-07-14 DIAGNOSIS — I25118 Atherosclerotic heart disease of native coronary artery with other forms of angina pectoris: Secondary | ICD-10-CM | POA: Diagnosis present

## 2024-07-14 DIAGNOSIS — I493 Ventricular premature depolarization: Secondary | ICD-10-CM | POA: Diagnosis not present

## 2024-07-14 DIAGNOSIS — I2510 Atherosclerotic heart disease of native coronary artery without angina pectoris: Secondary | ICD-10-CM

## 2024-07-14 DIAGNOSIS — I25810 Atherosclerosis of coronary artery bypass graft(s) without angina pectoris: Secondary | ICD-10-CM | POA: Diagnosis not present

## 2024-07-14 DIAGNOSIS — I252 Old myocardial infarction: Secondary | ICD-10-CM | POA: Diagnosis not present

## 2024-07-14 HISTORY — PX: LEFT HEART CATH AND CORS/GRAFTS ANGIOGRAPHY: CATH118250

## 2024-07-14 SURGERY — LEFT HEART CATH AND CORS/GRAFTS ANGIOGRAPHY
Anesthesia: LOCAL

## 2024-07-14 MED ORDER — FREE WATER: 500.0000 mL | Freq: Once | Status: DC

## 2024-07-14 MED ORDER — LIDOCAINE HCL (PF) 1 % IJ SOLN
INTRAMUSCULAR | Status: DC | PRN
  Administered 2024-07-14: 2 mL via INTRADERMAL

## 2024-07-14 MED ORDER — ACETAMINOPHEN 325 MG PO TABS
650.0000 mg | ORAL_TABLET | ORAL | Status: DC | PRN
Start: 2024-07-14 — End: 2024-07-14

## 2024-07-14 MED ORDER — VERAPAMIL HCL 2.5 MG/ML IV SOLN
INTRAVENOUS | Status: DC | PRN
  Administered 2024-07-14: 10 mL via INTRA_ARTERIAL

## 2024-07-14 MED ORDER — SODIUM CHLORIDE 0.9 % IV SOLN: 250.0000 mL | INTRAVENOUS | Status: DC | PRN

## 2024-07-14 MED ORDER — SODIUM CHLORIDE 0.9% FLUSH: 3.0000 mL | Freq: Two times a day (BID) | INTRAVENOUS | Status: DC

## 2024-07-14 MED ORDER — FENTANYL CITRATE (PF) 100 MCG/2ML IJ SOLN
INTRAMUSCULAR | Status: DC | PRN
Start: 2024-07-14 — End: 2024-07-14
  Administered 2024-07-14: 25 ug via INTRAVENOUS

## 2024-07-14 MED ORDER — HEPARIN (PORCINE) IN NACL 1000-0.9 UT/500ML-% IV SOLN
INTRAVENOUS | Status: DC | PRN
  Administered 2024-07-14 (×2): 500 mL

## 2024-07-14 MED ORDER — IOHEXOL 350 MG/ML SOLN
INTRAVENOUS | Status: DC | PRN
  Administered 2024-07-14: 70 mL

## 2024-07-14 MED ORDER — ONDANSETRON HCL 4 MG/2ML IJ SOLN
4.0000 mg | Freq: Four times a day (QID) | INTRAMUSCULAR | Status: DC | PRN
Start: 2024-07-14 — End: 2024-07-14

## 2024-07-14 MED ORDER — ASPIRIN 81 MG PO CHEW: 81.0000 mg | CHEWABLE_TABLET | ORAL | Status: DC

## 2024-07-14 MED ORDER — HEPARIN SODIUM (PORCINE) 1000 UNIT/ML IJ SOLN
INTRAMUSCULAR | Status: AC
  Filled 2024-07-14: qty 10

## 2024-07-14 MED ORDER — SODIUM CHLORIDE 0.9% FLUSH: 3.0000 mL | INTRAVENOUS | Status: DC | PRN

## 2024-07-14 MED ORDER — SODIUM CHLORIDE 0.9% FLUSH
3.0000 mL | INTRAVENOUS | Status: DC | PRN
Start: 2024-07-14 — End: 2024-07-14

## 2024-07-14 MED ORDER — MIDAZOLAM HCL 2 MG/2ML IJ SOLN
INTRAMUSCULAR | Status: AC
  Filled 2024-07-14: qty 2

## 2024-07-14 MED ORDER — MIDAZOLAM HCL 2 MG/2ML IJ SOLN
INTRAMUSCULAR | Status: DC | PRN
  Administered 2024-07-14: 1 mg via INTRAVENOUS

## 2024-07-14 MED ORDER — HEPARIN SODIUM (PORCINE) 1000 UNIT/ML IJ SOLN
INTRAMUSCULAR | Status: DC | PRN
Start: 2024-07-14 — End: 2024-07-14
  Administered 2024-07-14: 4000 [IU] via INTRAVENOUS

## 2024-07-14 MED ORDER — FENTANYL CITRATE (PF) 100 MCG/2ML IJ SOLN
INTRAMUSCULAR | Status: AC
  Filled 2024-07-14: qty 2

## 2024-07-14 MED ORDER — LIDOCAINE HCL (PF) 1 % IJ SOLN
INTRAMUSCULAR | Status: AC
  Filled 2024-07-14: qty 30

## 2024-07-14 MED ORDER — VERAPAMIL HCL 2.5 MG/ML IV SOLN
INTRAVENOUS | Status: AC
  Filled 2024-07-14: qty 2

## 2024-07-14 SURGICAL SUPPLY — 10 items
CATH INFINITI 5 FR IM (CATHETERS) IMPLANT
CATH INFINITI 5FR MULTPACK ANG (CATHETERS) IMPLANT
DEVICE RAD COMP TR BAND LRG (VASCULAR PRODUCTS) IMPLANT
ELECT DEFIB PAD ADLT CADENCE (PAD) IMPLANT
GLIDESHEATH SLEND SS 6F .021 (SHEATH) IMPLANT
GUIDEWIRE INQWIRE 1.5J.035X260 (WIRE) IMPLANT
PACK CARDIAC CATHETERIZATION (CUSTOM PROCEDURE TRAY) ×1 IMPLANT
SET ATX-X65L (MISCELLANEOUS) IMPLANT
SHEATH PROBE COVER 6X72 (BAG) IMPLANT
WIRE HI TORQ VERSACORE-J 145CM (WIRE) IMPLANT

## 2024-07-14 NOTE — Interval H&P Note (Signed)
 History and Physical Interval Note:  07/14/2024 9:54 AM  Isaac [name]  has presented today for surgery, with the diagnosis of abnormal ct.  The various methods of treatment have been discussed with the patient and family. After consideration of risks, benefits and other options for treatment, the patient has consented to  Procedure(s): LEFT HEART CATH AND CORS/GRAFTS ANGIOGRAPHY (N/A) as a surgical intervention.  The patient's history has been reviewed, patient examined, no change in status, stable for surgery.  I have reviewed the patient's chart and labs.  Questions were answered to the patient's satisfaction.   Cath Lab Visit (complete for each Cath Lab visit)  Clinical Evaluation Leading to the Procedure:   ACS: No.  Non-ACS:    Anginal Classification: CCS II  Anti-ischemic medical therapy: Minimal Therapy (1 class of medications)  Non-Invasive Test Results: High-risk stress test findings: cardiac mortality >3%/year  Prior CABG: Previous CABG        Maude Li [facility] 07/14/2024 9:55 AM

## 2024-07-14 NOTE — Discharge Instructions (Addendum)
 May resume Eliquis this evening  Drink plenty of fluid for 48 hours and keep wrist elevated at heart level for 24 hours  Radial Site Care   This sheet gives you information about how to care for yourself after your procedure. Your health care provider may also give you more specific instructions. If you have problems or questions, contact your health care provider. What can I expect after the procedure? After the procedure, it is common to have: Bruising and tenderness at the catheter insertion area. Follow these instructions at home: Medicines Take over-the-counter and prescription medicines only as told by your health care provider. Insertion site care Follow instructions from your health care provider about how to take care of your insertion site. Make sure you: Wash your hands with soap and water before you change your bandage (dressing). If soap and water are not available, use hand sanitizer. remove your dressing as told by your health care provider. In 24 hours Check your insertion site every day for signs of infection. Check for: Redness, swelling, or pain. Fluid or blood. Pus or a bad smell. Warmth. Do not take baths, swim, or use a hot tub until your health care provider approves. You may shower 24-48 hours after the procedure, or as directed by your health care provider. Remove the dressing and gently wash the site with plain soap and water. Pat the area dry with a clean towel. Do not rub the site. That could cause bleeding. Do not apply powder or lotion to the site. Activity   For 24 hours after the procedure, or as directed by your health care provider: Do not flex or bend the affected arm. Do not push or pull heavy objects with the affected arm. Do not drive yourself home from the hospital or clinic. You may drive 24 hours after the procedure unless your health care provider tells you not to. Do not operate machinery or power tools. Do not lift anything that is  heavier than 10 lb (4.5 kg), or the limit that you are told, until your health care provider says that it is safe. For 4 days Ask your health care provider when it is okay to: Return to work or school. Resume usual physical activities or sports. Resume sexual activity. General instructions If the catheter site starts to bleed, raise your arm and put firm pressure on the site. If the bleeding does not stop, get help right away. This is a medical emergency. If you went home on the same day as your procedure, a responsible adult should be with you for the first 24 hours after you arrive home. Keep all follow-up visits as told by your health care provider. This is important. Contact a health care provider if: You have a fever. You have redness, swelling, or yellow drainage around your insertion site. Get help right away if: You have unusual pain at the radial site. The catheter insertion area swells very fast. The insertion area is bleeding, and the bleeding does not stop when you hold steady pressure on the area. Your arm or hand becomes pale, cool, tingly, or numb. These symptoms may represent a serious problem that is an emergency. Do not wait to see if the symptoms will go away. Get medical help right away. Call your local emergency services (911 in the [place] Do not drive yourself to the hospital. Summary After the procedure, it is common to have bruising and tenderness at the site. Follow instructions from your health care provider about how  to take care of your radial site wound. Check the wound every day for signs of infection. Do not lift anything that is heavier than 10 lb (4.5 kg), or the limit that you are told, until your health care provider says that it is safe. This information is not intended to replace advice given to you by your health care provider. Make sure you discuss any questions you have with your health care provider. Document Revised: 12/03/2017 Document Reviewed:  12/03/2017 Elsevier Patient Education  2020 [employer].

## 2024-07-14 NOTE — Progress Notes (Signed)
 Patient and wife was given discharge instructions. Both verbalized understanding.

## 2024-07-15 ENCOUNTER — Ambulatory Visit (HOSPITAL_BASED_OUTPATIENT_CLINIC_OR_DEPARTMENT_OTHER): Admitting: Cardiology

## 2024-07-15 ENCOUNTER — Other Ambulatory Visit (HOSPITAL_COMMUNITY): Payer: Self-pay

## 2024-07-15 VITALS — BP 114/70 | HR 60 | Ht 70.0 in | Wt 161.0 lb

## 2024-07-15 DIAGNOSIS — I5042 Chronic combined systolic (congestive) and diastolic (congestive) heart failure: Secondary | ICD-10-CM | POA: Diagnosis not present

## 2024-07-15 DIAGNOSIS — I480 Paroxysmal atrial fibrillation: Secondary | ICD-10-CM

## 2024-07-15 DIAGNOSIS — I25709 Atherosclerosis of coronary artery bypass graft(s), unspecified, with unspecified angina pectoris: Secondary | ICD-10-CM

## 2024-07-15 MED ORDER — SACUBITRIL-VALSARTAN 24-26 MG PO TABS
1.0000 | ORAL_TABLET | Freq: Two times a day (BID) | ORAL | 3 refills | Status: AC
  Filled 2024-07-15: qty 180, 90d supply, fill #0
  Filled 2024-10-11: qty 180, 90d supply, fill #1

## 2024-07-15 NOTE — Patient Instructions (Signed)
 Medication Instructions:  Please discontinue Losartan  and start Entresto  24-26 mg one tablet twice a day. Continue all other medications.  *If you need a refill on your cardiac medications before your next appointment, please call your pharmacy*  Follow-Up: At [facility], you and your health needs are our priority.  As part of our continuing mission to provide you with exceptional heart care, our providers are all part of one team.  This team includes your primary Cardiologist (physician) and Advanced Practice Providers or APPs (Physician Assistants and Nurse Practitioners) who all work together to provide you with the care you need, when you need it.  Your next appointment:   4 month(s)  Provider:   One of our Advanced Practice Providers (APPs): Morse Clause, PA-C  Lamarr Satterfield, NP Miriam Shams, NP  Olivia Pavy, PA-C Josefa Beauvais, NP  Leontine Salen, PA-C Orren Fabry, PA-C  [place], PA-C Ernest Dick, NP  Damien Braver, NP Jon Hails, PA-C  Waddell Donath, PA-C    Dayna Dunn, PA-C  Scott Weaver, PA-C Lum Louis, NP Katlyn West, NP Callie Goodrich, PA-C  Evan Williams, PA-C Sheng Haley, PA-C  Xika Zhao, NP Kathleen Johnson, PA-C       We recommend signing up for the patient portal called MyChart.  Sign up information is provided on this After Visit Summary.  MyChart is used to connect with patients for Virtual Visits (Telemedicine).  Patients are able to view lab/test results, encounter notes, upcoming appointments, etc.  Non-urgent messages can be sent to your provider as well.   To learn more about what you can do with MyChart, go to [email or web address].

## 2024-07-15 NOTE — Progress Notes (Signed)
 Cardiology Office Note:  .   Date:  07/15/2024  ID:  Isaac [name], DOB 12-22-1950, MRN [id number] PCP: Charlott Dorn LABOR, MD  [facility] Providers Cardiologist:  Oneil Parchment, MD Electrophysiologist:  Danelle Birmingham, MD     History of Present Illness: Isaac [name] is a 73 y.o. male Discussed the use of AI scribe software for clinical note transcription with the patient, who gave verbal consent to proceed.  History of Present Illness Isaac [name] is a 73 year old male with coronary artery disease and a history of myocardial infarction who presents for follow-up after a recent heart catheterization.  He underwent a heart catheterization on July 14, 2024, which revealed single vessel occlusive coronary artery disease involving the proximal LAD. The LIMA to LAD is patent, but the SVG to the first diagonal is occluded and fills retrograde from the LIMA graft. He has moderate LV dysfunction with an ejection fraction visually estimated between 35-45% and a normal LV EDP of 16 mmHg. A recent PET CT showed an ejection fraction of 44% and a defect in the apical to mid anterior anteroceptal apical location, compatible with a down SVG to diagonal graft.  He is currently on medical management with Toprol  25 mg daily, Crestor  5 mg daily, losartan  25 mg daily, and Eliquis  5 mg daily for paroxysmal atrial fibrillation. He has a history of an old myocardial infarction in the LAD distribution and underwent bypass surgery in December 2010 and ablation for atrial fibrillation in 2017. He reports a 9% PVC burden on a prior monitor.  He describes experiencing mild symptoms such as being 'a little bit more winded' when going up stairs, but denies severe symptoms.  He reports having odd pains in his lower abdomen on either side of his penis and pubic bone, which sometimes radiate to his sides as if headed for his kidneys. These symptoms have been present for the last five months and sometimes  resolve spontaneously. He was evaluated by a urologist and underwent a CT scan last week, but has not yet received the results. He had a double hernia repair two years ago, and his internist initially suspected an infection, for which he was given a low dose antibiotic.      Studies Reviewed: .        Results LABS Creatinine: 0.93 Potassium: 4.2 Hemoglobin: 15.3 LDL cholesterol: 55  RADIOLOGY PET CT: Ejection fraction 44%, defect in apical to mid anterior anteroceptal apical location compatible with down SVG to diagonal graft  DIAGNOSTIC Heart catheterization: Single vessel occlusive coronary artery disease involving the proximal LAD, patent Lima to LAD, occluded SVG to the first diagonal, moderate LV dysfunction with ejection fraction 35-45%, normal LV EDP 16 mmHg (07/14/2024) EKG: Normal sinus rhythm with old anterior infarct pattern Risk Assessment/Calculations:            Physical Exam:   VS:  BP 114/70 (BP Location: Right Arm, Patient Position: Sitting, Cuff Size: Normal)   Pulse 60   Ht 5' 10 (1.778 m)   Wt 161 lb (73 kg)   BMI 23.10 kg/m    Wt Readings from Last 3 Encounters:  07/15/24 161 lb (73 kg)  07/14/24 157 lb (71.2 kg)  07/07/24 159 lb 6.4 oz (72.3 kg)    GEN: Well nourished, well developed in no acute distress NECK: No JVD; No carotid bruits CARDIAC: RRR, no murmurs, no rubs, no gallops RESPIRATORY:  Clear to auscultation without rales, wheezing or rhonchi  ABDOMEN: Soft, non-tender, non-distended EXTREMITIES:  No edema; No deformity, left wrist catheterization site clean dry and intact, dressed  ASSESSMENT AND PLAN: .    Assessment and Plan Assessment & Plan Coronary artery disease with prior LAD infarct and bypass grafts Single vessel occlusive coronary artery disease involving the proximal LAD. Patent Lima to LAD. Occluded SVG to the first diagonal, filling retrograde from the LAD lemagraph. Medical management is being pursued. No need for further  bypass surgery due to risks outweighing benefits. LDL cholesterol at 55 mg/dL, which is optimal post-bypass. The main bypass is open and functioning well, providing retrograde flow to the occluded graft, which is sufficient to maintain perfusion without additional surgical intervention. - Continue current medical management. - Consider isosorbide or Imdur if symptoms worsen.  Moderate left ventricular systolic dysfunction (chronic systolic heart failure) Moderate LV dysfunction with ejection fraction between 35-45%. Current medications include Toprol  and losartan , both beneficial for pump function. Consideration of Entresto , which combines losartan  with another agent, to potentially improve ejection fraction and reduce hospitalizations. Entresto  may lower blood pressure further, which could cause dizziness. Cost considerations discussed, with potential copay around $40 for Medicare patients. - Discontinue losartan . - Initiate Entresto  at low dose, 24/26 mg, twice a day. - Monitor blood pressure regularly, especially for systolic readings in the 90s. - Schedule follow-up in 4 months with APP to assess response to Entresto . - Instruct to report any symptoms of dizziness or low blood pressure.  Paroxysmal atrial fibrillation Currently managed with Eliquis  5 mg bid. No acute issues discussed during this encounter. - Continue Eliquis  5 mg bid.         Dispo: 4 months APP  Signed, Oneil Parchment, MD

## 2024-07-16 ENCOUNTER — Other Ambulatory Visit (HOSPITAL_COMMUNITY): Payer: Self-pay

## 2024-07-16 MED ORDER — TAMSULOSIN HCL 0.4 MG PO CAPS
0.4000 mg | ORAL_CAPSULE | Freq: Every day | ORAL | 3 refills | Status: AC
  Filled 2024-07-16: qty 90, 90d supply, fill #0
  Filled 2024-10-18: qty 90, 90d supply, fill #1

## 2024-07-19 ENCOUNTER — Encounter (HOSPITAL_BASED_OUTPATIENT_CLINIC_OR_DEPARTMENT_OTHER): Payer: Self-pay | Admitting: Cardiology

## 2024-07-20 ENCOUNTER — Telehealth: Payer: Self-pay | Admitting: Cardiology

## 2024-07-20 ENCOUNTER — Ambulatory Visit: Admitting: Podiatry

## 2024-07-20 ENCOUNTER — Encounter: Payer: Self-pay | Admitting: Podiatry

## 2024-07-20 DIAGNOSIS — B351 Tinea unguium: Secondary | ICD-10-CM | POA: Diagnosis not present

## 2024-07-20 DIAGNOSIS — M79674 Pain in right toe(s): Secondary | ICD-10-CM | POA: Diagnosis not present

## 2024-07-20 DIAGNOSIS — M79675 Pain in left toe(s): Secondary | ICD-10-CM | POA: Diagnosis not present

## 2024-07-20 DIAGNOSIS — L84 Corns and callosities: Secondary | ICD-10-CM

## 2024-07-20 DIAGNOSIS — Z7901 Long term (current) use of anticoagulants: Secondary | ICD-10-CM

## 2024-07-20 NOTE — Telephone Encounter (Signed)
 Left message to call office.

## 2024-07-20 NOTE — Telephone Encounter (Signed)
 Spoke to patient he stated he had a cardiac cath last Wed 9/3.Stated 2 above cath site left arm he has a small circular red area.Stated red area tender to touch.Cool to touch.No swelling. Stated seems alittle better since yesterday.Stated cath at site left wrist has bruising.No pain.No swelling.Advised to continue to monitor and call back if does not improve or becomes worse.I will make Dr.Skains aware.

## 2024-07-20 NOTE — Telephone Encounter (Signed)
 Pt returning call

## 2024-07-20 NOTE — Progress Notes (Signed)
  Subjective:  Patient ID: Isaac [name], male    DOB: [birth year],   MRN: [id number]  Chief Complaint  Patient presents with   Nail Problem    Trim my toenails.   Callouses    I need her to check a couple of calluses.  I have one on the side of my right foot that's bothering me.    73 y.o. male presents for concern of calluses and corns on both of his feet as above. Also here for nail trim as well.  He is on blood thinners and at risk for foot care.  . Denies any other pedal complaints. Denies n/v/f/c.   Past Medical History:  Diagnosis Date   CERVICAL SPINE DISORDER, NOS    Chronic systolic (congestive) heart failure (HCC)    EF 40%   Coronary artery disease    a. 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 CFT <3 seconds. Normal hair growth on digits. No edema.  Skin. No lacerations or abrasions bilateral feet.  Hyperkeratotic tissue noted plantar second and third and fifth metatarsal on the right and third metatarsal head on the left. Nails 1-5 elongated and dystrophic with subungual debris bilateral.  Musculoskeletal: MMT 5/5 bilateral lower extremities in DF, PF, Inversion and Eversion. Deceased ROM in DF of ankle joint.  Neurological: Sensation intact to light touch.   Assessment:   1. Corn or callus   2. Chronic anticoagulation   3. Pain due to onychomycosis of toenails of both feet      Plan:  Patient was evaluated and treated and all questions answered. -Hyperkeratotic tissue plantar second and third and fifth metatarsal on the right and third metatarsal on the left was debrided with chisel without incident x4.  -Mechanically debrided nails 1-5 bilateral with nail nipper without incident.  -Applied  salycylic acid treatment to area with dressing. Advised to remove bandaging tomorrow.  -Encouraged daily moisturizing -Discussed use of pumice stone -Advised good supportive shoes and inserts -Answered all patient questions -Patient to return  in 3 months for at risk foot care -Patient advised to call the office if any problems or questions arise in the meantime.   Asberry Failing, DPM

## 2024-07-20 NOTE — Telephone Encounter (Signed)
 Pt returning nurses call from yesterday regarding MyChart message for Cath Site. Please advise

## 2024-07-26 ENCOUNTER — Other Ambulatory Visit (HOSPITAL_COMMUNITY): Payer: Self-pay

## 2024-08-04 ENCOUNTER — Telehealth: Payer: Self-pay

## 2024-08-04 DIAGNOSIS — I4891 Unspecified atrial fibrillation: Secondary | ICD-10-CM | POA: Diagnosis not present

## 2024-08-04 DIAGNOSIS — I5022 Chronic systolic (congestive) heart failure: Secondary | ICD-10-CM | POA: Diagnosis not present

## 2024-08-04 DIAGNOSIS — I2510 Atherosclerotic heart disease of native coronary artery without angina pectoris: Secondary | ICD-10-CM | POA: Diagnosis not present

## 2024-08-04 DIAGNOSIS — Z7901 Long term (current) use of anticoagulants: Secondary | ICD-10-CM | POA: Diagnosis not present

## 2024-08-04 DIAGNOSIS — R9389 Abnormal findings on diagnostic imaging of other specified body structures: Secondary | ICD-10-CM | POA: Diagnosis not present

## 2024-08-04 NOTE — Telephone Encounter (Signed)
 Pharmacy please advise on holding Eliquis  2 days prior to  [facility]  scheduled for 09/03/2024. Thank you.

## 2024-08-04 NOTE — Telephone Encounter (Signed)
   Pre-operative Risk Assessment    Patient Name: Isaac [name]  DOB: 05-Aug-1951 MRN: [id number]   Date of last office visit: 07/15/24 ONEIL PARCHMENT, MD Date of next office visit: NONE   Request for Surgical Clearance    Procedure:  FLEXSIGMOIDOSCOPY  Date of Surgery:  Clearance 09/03/24                                Surgeon:  DR ELSIE CREE Surgeon's Group or Practice Name:  [facility] Phone number:  [phone] Fax number:  [phone]   Type of Clearance Requested:   - Medical  - Pharmacy:  Hold Apixaban  (Eliquis ) 2 DAYS PRIOR   Type of Anesthesia:  PROPOFOL    Additional requests/questions:    Signed, Lucie DELENA Ku   08/04/2024, 3:18 PM

## 2024-08-04 NOTE — Telephone Encounter (Signed)
 Dr. Jeffrie,  You saw this patient on 07/15/2024. Per protocol we request that you comment on his cardiac risk to proceed with flex sigmoidoscopy  since it has been less than 2 months since evaluated in the office. Please send your comment to P CV Pre-Op Pool.  Thank you, Lamarr Satterfield DNP, ANP, AACC.

## 2024-08-09 ENCOUNTER — Other Ambulatory Visit (HOSPITAL_COMMUNITY): Payer: Self-pay

## 2024-08-09 ENCOUNTER — Other Ambulatory Visit: Payer: Self-pay | Admitting: Cardiology

## 2024-08-09 MED ORDER — ROSUVASTATIN CALCIUM 5 MG PO TABS
5.0000 mg | ORAL_TABLET | Freq: Every day | ORAL | 3 refills | Status: AC
  Filled 2024-08-09: qty 90, 90d supply, fill #0
  Filled 2024-11-05: qty 90, 90d supply, fill #1

## 2024-08-11 NOTE — Telephone Encounter (Signed)
 Patient with diagnosis of Afib on Eliquis  for anticoagulation.    Procedure:  FLEXSIGMOIDOSCOPY   Date of Surgery:  Clearance 09/03/24  CHA2DS2-VASc Score = 5   This indicates a 7.2% annual risk of stroke. The patient's score is based upon: CHF History: 1 HTN History: 1 Diabetes History: 0 Stroke History: 0 Vascular Disease History: 1 Age Score: 2 Gender Score: 0   CrCl 57 ml/min Platelet count 220 K  Patient has not had an Afib/aflutter ablation within the last 3 months or DCCV within the last 30 days  Per office protocol, patient can hold Eliquis  for 2 days prior to procedure.   Patient will not need bridging with Lovenox (enoxaparin) around procedure.  **This guidance is not considered finalized until pre-operative APP has relayed final recommendations.**

## 2024-08-11 NOTE — Telephone Encounter (Signed)
   Name: Isaac [name]  DOB: 01-09-1951  MRN: [id number]   Primary Cardiologist: Oneil Parchment, MD  Chart reviewed as part of pre-operative protocol coverage.   Per Dr. Parchment: Genna to proceed with sigmoidoscopy.    Therefore, based on ACC/AHA guidelines, the patient would be at acceptable risk for the planned procedure without further cardiovascular testing.   Per our clinical pharmacist: Per office protocol, patient can hold Eliquis  for 2 days prior to procedure.   Patient will not need bridging with Lovenox (enoxaparin) around procedure.   I will route this recommendation to the requesting party via Epic fax function and remove from pre-op pool. Please call with questions.  Jon Nat Hails, PA 08/11/2024, 9:37 AM

## 2024-09-03 ENCOUNTER — Other Ambulatory Visit (HOSPITAL_BASED_OUTPATIENT_CLINIC_OR_DEPARTMENT_OTHER): Payer: Self-pay

## 2024-09-03 MED ORDER — COMIRNATY 30 MCG/0.3ML IM SUSY
0.3000 mL | PREFILLED_SYRINGE | Freq: Once | INTRAMUSCULAR | 0 refills | Status: AC
  Filled 2024-09-03: qty 0.3, 1d supply, fill #0

## 2024-09-03 MED ORDER — FLUZONE HIGH-DOSE 0.5 ML IM SUSY
0.5000 mL | PREFILLED_SYRINGE | Freq: Once | INTRAMUSCULAR | 0 refills | Status: AC
  Filled 2024-09-03: qty 0.5, 1d supply, fill #0

## 2024-09-28 ENCOUNTER — Telehealth: Payer: Self-pay | Admitting: Cardiology

## 2024-09-28 NOTE — Telephone Encounter (Signed)
 Patient calling to speak to the nurse about care with Dr. Jeffrie. Please advise

## 2024-09-28 NOTE — Telephone Encounter (Signed)
 4 month f/u appointment scheduled.

## 2024-10-19 ENCOUNTER — Ambulatory Visit: Admitting: Podiatry

## 2024-10-22 ENCOUNTER — Other Ambulatory Visit (HOSPITAL_COMMUNITY): Payer: Self-pay

## 2024-11-02 ENCOUNTER — Encounter (HOSPITAL_BASED_OUTPATIENT_CLINIC_OR_DEPARTMENT_OTHER): Payer: Self-pay | Admitting: Cardiology

## 2024-11-18 ENCOUNTER — Ambulatory Visit: Admitting: Physician Assistant

## 2024-11-22 ENCOUNTER — Other Ambulatory Visit (HOSPITAL_COMMUNITY): Payer: Self-pay

## 2024-11-22 ENCOUNTER — Encounter: Payer: Self-pay | Admitting: *Deleted

## 2024-11-22 ENCOUNTER — Other Ambulatory Visit: Payer: Self-pay | Admitting: Cardiology

## 2024-11-22 DIAGNOSIS — I480 Paroxysmal atrial fibrillation: Secondary | ICD-10-CM

## 2024-11-22 MED ORDER — APIXABAN 5 MG PO TABS
5.0000 mg | ORAL_TABLET | Freq: Two times a day (BID) | ORAL | 1 refills | Status: AC
  Filled 2024-11-22: qty 180, 90d supply, fill #0

## 2024-11-22 NOTE — Telephone Encounter (Signed)
 Pt last saw Dr Jeffrie 07/15/24, last labs 07/07/24 Creat 0.93, age 74, weight 73kg, based on specified criteria pt is on appropriate dosage of Eliquis  5mg  BID for afib.  Will refill rx.

## 2024-11-23 ENCOUNTER — Encounter: Payer: Self-pay | Admitting: Physician Assistant

## 2024-11-23 ENCOUNTER — Ambulatory Visit: Attending: Physician Assistant | Admitting: Physician Assistant

## 2024-11-23 ENCOUNTER — Ambulatory Visit: Admitting: Physician Assistant

## 2024-11-23 VITALS — BP 116/66 | HR 68 | Ht 70.0 in | Wt 160.2 lb

## 2024-11-23 DIAGNOSIS — I5020 Unspecified systolic (congestive) heart failure: Secondary | ICD-10-CM

## 2024-11-23 DIAGNOSIS — I480 Paroxysmal atrial fibrillation: Secondary | ICD-10-CM

## 2024-11-23 DIAGNOSIS — I6523 Occlusion and stenosis of bilateral carotid arteries: Secondary | ICD-10-CM | POA: Diagnosis not present

## 2024-11-23 DIAGNOSIS — I25709 Atherosclerosis of coronary artery bypass graft(s), unspecified, with unspecified angina pectoris: Secondary | ICD-10-CM | POA: Diagnosis not present

## 2024-11-23 DIAGNOSIS — I493 Ventricular premature depolarization: Secondary | ICD-10-CM

## 2024-11-23 LAB — BASIC METABOLIC PANEL WITH GFR
BUN/Creatinine Ratio: 20 (ref 10–24)
BUN: 21 mg/dL (ref 8–27)
CO2: 20 mmol/L (ref 20–29)
Calcium: 9.5 mg/dL (ref 8.6–10.2)
Chloride: 106 mmol/L (ref 96–106)
Creatinine, Ser: 1.04 mg/dL (ref 0.76–1.27)
Glucose: 92 mg/dL (ref 70–99)
Potassium: 4.2 mmol/L (ref 3.5–5.2)
Sodium: 141 mmol/L (ref 134–144)
eGFR: 75 mL/min/1.73

## 2024-11-23 NOTE — Assessment & Plan Note (Addendum)
 S/p PVI ablation in 2017. Maintaining NSR.  - Continue Eliquis  5 mg twice daily.

## 2024-11-23 NOTE — Progress Notes (Signed)
 "     OFFICE NOTE:    Date:  11/23/2024  ID:  Isaac [name], DOB Sep 27, 1951, MRN [id number] PCP: Charlott Dorn LABOR, MD  [facility] Providers Cardiologist:  Oneil Parchment, MD Electrophysiologist:  Danelle Birmingham, MD        Coronary artery disease s/p CABG  PET MPI 06/15/24: ant infarct w peri-infarct ischemia; global MBFR 2.11 (normal), EF 44; high risk  LHC 07/14/24: oLAD 100, mLAD 50; L-LAD patent, S-D1 100 (Dx fills retrograde from [facility]) - Med Rx Paroxysmal atrial fibrillation  S/p PVI ablation 09/2016 (Dr. Kelsie) HFmrEF (heart failure with mildly reduced ejection fraction)  CMR 02/26/2010: EF 47; ant and ant-sept scar TTE 11/29/2021: EF 40-45, global HK, GR 1 DD, mild LVH, normal RVSF, trivial MR, mild-moderate TR, AV sclerosis LHC 07/2024: Visually EF 35-45 PVCs Monitor 11/2021: PVC burden 10% Carotid Stenosis Carotid US  12/21/18: Bilateral ICA 39 Hyperlipidemia         Discussed the use of AI scribe software for clinical note transcription with the patient, who gave verbal consent to proceed. History of Present Illness Isaac [name] is a 74 y.o. male for follow up of CAD, CHF, AFib. He was last seen by Dr. Parchment in 07/2024 after his cardiac catheterization. ARB was DC'd and he was placed on Entresto .   He experiences increased frequency of lightheadedness, particularly when standing up from a sitting position or bending over, and occasionally when turning his head to the left. He also experiences general fatigue, though he is still able to perform activities like raking leaves, riding an exercise bike, vacuuming, and cooking. The lightheadedness occurs more frequently in the morning and sometimes when drinking water  or looking to his left while sitting. No episodes of syncope or falls. The symptoms of lightheadedness and fatigue have been variable, with periods of worsening and improvement. No dyspnea, chest discomfort, or edema. He monitors his blood pressure at home, noting  that it sometimes reads low, sometimes below 100.     ROS-See HPI     Studies Reviewed:        Results Labs TSH (06/17/2024): 6.77 Free T4 (06/17/2024): 0.73 Potassium (07/07/2024): 4.2 Creatinine (07/07/2024): 0.93 Hemoglobin (07/07/2024): 15.3 Platelet count (07/07/2024): 220,000 LDL (06/18/2024): 55 Total cholesterol (06/18/2024): 124 HDL (06/18/2024): 51 Triglycerides (06/18/2024): 94 ALT (04/16/2024): 22  Risk Assessment/Calculations: CHA2DS2-VASc Score = 5   This indicates a 7.2% annual risk of stroke. The patient's score is based upon: CHF History: 1 HTN History: 1 Diabetes History: 0 Stroke History: 0 Vascular Disease History: 1 Age Score: 2 Gender Score: 0            Physical Exam:  VS:  BP 116/66   Pulse 68   Ht 5' 10 (1.778 m)   Wt 160 lb 3.2 oz (72.7 kg)   SpO2 96%   BMI 22.99 kg/m        Wt Readings from Last 3 Encounters:  11/23/24 160 lb 3.2 oz (72.7 kg)  07/15/24 161 lb (73 kg)  07/14/24 157 lb (71.2 kg)    Constitutional:      Appearance: Healthy appearance. Not in distress.  Neck:     Vascular: No carotid bruit. JVD normal.  Pulmonary:     Breath sounds: Normal breath sounds. No wheezing. No rales.  Cardiovascular:     Normal rate. Regular rhythm.     Murmurs: There is no murmur.  Edema:    Peripheral edema absent.  Abdominal:     Palpations: Abdomen  is soft.       Assessment and Plan:    Assessment & Plan Heart failure with mildly reduced ejection fraction (HFmrEF, 41-49%) (HCC) EF 40-45. Volume status currently stable. Symptoms of lightheadedness likely related to low blood pressure from Entresto . Entresto  is superior to losartan  for heart failure management, but if symptoms are intolerable it would be reasonable to switch back to Losartan . He prefers to continue Entresto  for now.  - Continue Entresto  24/26 mg twice daily. - Continue Metoprolol  succinate 25 mg once daily  - He is intol of SGLT2i due to yeast infection -  Would hold off on MRA given low BP and symptoms with Entresto  - BMET today - Encouraged hydration - Follow up in six months  Coronary artery disease involving coronary bypass graft of native heart with angina pectoris Hx of CABG. Cardiac catheterization in 07/2024 demonstrated patent L-LAD. S-Dx was occluded but the Dx filled retrograde from [facility]. Med Rx was continued. No symptoms of angina. - Continue Crestor  5 mg once daily, Metoprolol  succinate 25 mg once daily.  Paroxysmal atrial fibrillation (HCC) S/p PVI ablation in 2017. Maintaining NSR.  - Continue Eliquis  5 mg twice daily.  PVC's (premature ventricular contractions) 10% burden on monitor in 2023.  - Continue Metoprolol  succinate 25 mg once daily.  Bilateral carotid artery stenosis US  in 2020 with bilat ICA 1-39. - Order follow up Carotid US .        Dispo:  Return in about 6 months (around 05/23/2025) for Routine Follow Up, w/ Dr. Jeffrie.  Signed, Glendia Ferrier, PA-C   "

## 2024-11-23 NOTE — Assessment & Plan Note (Addendum)
 Hx of CABG. Cardiac catheterization in 07/2024 demonstrated patent L-LAD. S-Dx was occluded but the Dx filled retrograde from [facility]. Med Rx was continued. No symptoms of angina. - Continue Crestor  5 mg once daily, Metoprolol  succinate 25 mg once daily.

## 2024-11-23 NOTE — Patient Instructions (Signed)
 Medication Instructions:  No medication changes were made during today's visit.  *If you need a refill on your cardiac medications before your next appointment, please call your pharmacy*   Lab Work: BMET If you have labs (blood work) drawn today and your tests are completely normal, you will receive your results only by: MyChart Message (if you have MyChart) OR A paper copy in the mail If you have any lab test that is abnormal or we need to change your treatment, we will call you to review the results.   Testing/Procedures: Your physician has requested that you have a carotid duplex. This test is an ultrasound of the carotid arteries in your neck. It looks at blood flow through these arteries that supply the brain with blood. Allow one hour for this exam. There are no restrictions or special instructions.   Your next appointment:   6 month(s)   Provider:   Oneil Parchment, MD     Follow-Up: At [facility], you and your health needs are our priority.  As part of our continuing mission to provide you with exceptional heart care, we have created designated Provider Care Teams.  These Care Teams include your primary Cardiologist (physician) and Advanced Practice Providers (APPs -  Physician Assistants and Nurse Practitioners) who all work together to provide you with the care you need, when you need it. We recommend signing up for the patient portal called MyChart.  Sign up information is provided on this After Visit Summary.  MyChart is used to connect with patients for Virtual Visits (Telemedicine).  Patients are able to view lab/test results, encounter notes, upcoming appointments, etc.  Non-urgent messages can be sent to your provider as well.   To learn more about what you can do with MyChart, go to [email or web address].

## 2024-11-23 NOTE — Assessment & Plan Note (Addendum)
 10% burden on monitor in 2023.  - Continue Metoprolol  succinate 25 mg once daily.

## 2024-11-24 ENCOUNTER — Ambulatory Visit: Payer: Self-pay | Admitting: Physician Assistant

## 2024-11-26 ENCOUNTER — Ambulatory Visit (HOSPITAL_COMMUNITY)
Admission: RE | Admit: 2024-11-26 | Discharge: 2024-11-26 | Disposition: A | Discharge status: Home / Self Care | Source: Ambulatory Visit | Attending: Physician Assistant | Admitting: Physician Assistant

## 2024-11-26 DIAGNOSIS — I6523 Occlusion and stenosis of bilateral carotid arteries: Secondary | ICD-10-CM | POA: Insufficient documentation

## 2024-11-28 DIAGNOSIS — I6523 Occlusion and stenosis of bilateral carotid arteries: Secondary | ICD-10-CM | POA: Insufficient documentation

## 2025-01-18 ENCOUNTER — Ambulatory Visit: Admitting: Podiatry

## 2025-02-21 ENCOUNTER — Ambulatory Visit: Admitting: Student
